# Patient Record
Sex: Male | Born: 1960 | Race: White | Hispanic: No | Marital: Married | Smoking: Current some day smoker
Health system: Southern US, Community
[De-identification: ages and names within clinical notes are randomized; demographics above are authoritative.]

## PROBLEM LIST (undated history)

## (undated) DIAGNOSIS — D582 Other hemoglobinopathies: Secondary | ICD-10-CM

## (undated) DIAGNOSIS — E785 Hyperlipidemia, unspecified: Secondary | ICD-10-CM

## (undated) DIAGNOSIS — I10 Essential (primary) hypertension: Secondary | ICD-10-CM

## (undated) DIAGNOSIS — I2510 Atherosclerotic heart disease of native coronary artery without angina pectoris: Secondary | ICD-10-CM

## (undated) HISTORY — PX: CARDIAC CATHETERIZATION: SHX172

---

## 2006-04-07 ENCOUNTER — Emergency Department (HOSPITAL_COMMUNITY): Admission: EM | Admit: 2006-04-07 | Discharge: 2006-04-07 | Payer: Self-pay | Admitting: Emergency Medicine

## 2006-04-11 ENCOUNTER — Emergency Department (HOSPITAL_COMMUNITY): Admission: EM | Admit: 2006-04-11 | Discharge: 2006-04-12 | Payer: Self-pay | Admitting: Emergency Medicine

## 2007-02-09 ENCOUNTER — Emergency Department (HOSPITAL_COMMUNITY): Admission: EM | Admit: 2007-02-09 | Discharge: 2007-02-09 | Payer: Self-pay | Admitting: Emergency Medicine

## 2009-04-11 ENCOUNTER — Emergency Department (HOSPITAL_COMMUNITY): Admission: EM | Admit: 2009-04-11 | Discharge: 2009-04-11 | Payer: Self-pay | Admitting: Emergency Medicine

## 2010-10-11 LAB — RAPID STREP SCREEN (MED CTR MEBANE ONLY): Streptococcus, Group A Screen (Direct): NEGATIVE

## 2010-12-21 ENCOUNTER — Inpatient Hospital Stay (HOSPITAL_COMMUNITY)
Admission: EM | Admit: 2010-12-21 | Discharge: 2010-12-25 | DRG: 303 | Disposition: A | Discharge status: Home / Self Care | Payer: Medicaid Other | Source: Ambulatory Visit | Attending: Internal Medicine | Admitting: Internal Medicine

## 2010-12-21 ENCOUNTER — Emergency Department (HOSPITAL_COMMUNITY): Payer: Medicaid Other

## 2010-12-21 ENCOUNTER — Encounter: Payer: Self-pay | Admitting: Emergency Medicine

## 2010-12-21 DIAGNOSIS — Z79899 Other long term (current) drug therapy: Secondary | ICD-10-CM

## 2010-12-21 DIAGNOSIS — I10 Essential (primary) hypertension: Secondary | ICD-10-CM | POA: Diagnosis present

## 2010-12-21 DIAGNOSIS — I200 Unstable angina: Secondary | ICD-10-CM | POA: Diagnosis present

## 2010-12-21 DIAGNOSIS — E785 Hyperlipidemia, unspecified: Secondary | ICD-10-CM | POA: Diagnosis present

## 2010-12-21 DIAGNOSIS — I249 Acute ischemic heart disease, unspecified: Secondary | ICD-10-CM

## 2010-12-21 DIAGNOSIS — J45909 Unspecified asthma, uncomplicated: Secondary | ICD-10-CM | POA: Diagnosis present

## 2010-12-21 DIAGNOSIS — E119 Type 2 diabetes mellitus without complications: Secondary | ICD-10-CM | POA: Diagnosis present

## 2010-12-21 DIAGNOSIS — I2510 Atherosclerotic heart disease of native coronary artery without angina pectoris: Principal | ICD-10-CM | POA: Diagnosis present

## 2010-12-21 DIAGNOSIS — Z87891 Personal history of nicotine dependence: Secondary | ICD-10-CM

## 2010-12-21 DIAGNOSIS — Z23 Encounter for immunization: Secondary | ICD-10-CM

## 2010-12-21 DIAGNOSIS — Z7982 Long term (current) use of aspirin: Secondary | ICD-10-CM

## 2010-12-21 HISTORY — DX: Essential (primary) hypertension: I10

## 2010-12-21 HISTORY — DX: Hyperlipidemia, unspecified: E78.5

## 2010-12-21 LAB — POCT I-STAT, CHEM 8
BUN: 25 mg/dL — ABNORMAL HIGH (ref 6–23)
Calcium, Ion: 1.25 mmol/L (ref 1.12–1.32)
HCT: 56 % — ABNORMAL HIGH (ref 39.0–52.0)
Sodium: 140 mEq/L (ref 135–145)
TCO2: 28 mmol/L (ref 0–100)

## 2010-12-21 LAB — CBC
Hemoglobin: 18.7 g/dL — ABNORMAL HIGH (ref 13.0–17.0)
MCHC: 35.8 g/dL (ref 30.0–36.0)
Platelets: 178 10*3/uL (ref 150–400)
RDW: 13 % (ref 11.5–15.5)
WBC: 9.9 10*3/uL (ref 4.0–10.5)

## 2010-12-21 LAB — DIFFERENTIAL
Basophils Absolute: 0.1 10*3/uL (ref 0.0–0.1)
Eosinophils Relative: 4 % (ref 0–5)
Lymphocytes Relative: 38 % (ref 12–46)
Lymphs Abs: 3.7 10*3/uL (ref 0.7–4.0)
Monocytes Absolute: 0.7 10*3/uL (ref 0.1–1.0)
Neutro Abs: 4.9 10*3/uL (ref 1.7–7.7)
Neutrophils Relative %: 50 % (ref 43–77)

## 2010-12-21 LAB — POCT I-STAT TROPONIN I

## 2010-12-21 NOTE — ED Notes (Signed)
Pt reports left sided chest pain radiates to left upper back x 1 week.

## 2010-12-21 NOTE — ED Notes (Signed)
Patient was in [place] last month and did not feel well (i.e. Chest pain); went to see a MD.  Patient was told that he had an 80% blockage in one of his cardiac arteries.  Patient states that he did not believe what the doctor told him, but last week, patient began to feel the same chest pain as before.  Patient describes location of the chest pain as "left sided"; pain radiates to his upper back.  Patient rates pain 8/10 on the numerical pain scale; unable to describe pain.  Patient states that the pain has been going on for a month and a half; worsening pain today (patient states that he was not doing anything when the pain suddenly became worse).  Patient denies shortness of breath, dizziness, nausea/vomiting, and numbness/tingling in hands/feet.  Upon arrival to room, patient connected to continuous cardiac, pulse ox, and blood pressure monitor.  Patient changed into gown; patient alert and oriented x4; PERRL present.  Will continue to monitor.

## 2010-12-22 ENCOUNTER — Encounter (HOSPITAL_COMMUNITY): Payer: Self-pay | Admitting: Cardiovascular Disease

## 2010-12-22 DIAGNOSIS — R079 Chest pain, unspecified: Secondary | ICD-10-CM

## 2010-12-22 DIAGNOSIS — I249 Acute ischemic heart disease, unspecified: Secondary | ICD-10-CM

## 2010-12-22 LAB — CREATININE, SERUM
Creatinine, Ser: 0.86 mg/dL (ref 0.50–1.35)
GFR calc Af Amer: >90 mL/min (ref >90)
GFR calc non Af Amer: >90 mL/min (ref >90)

## 2010-12-22 LAB — CARDIAC PANEL(CRET KIN+CKTOT+MB+TROPI)
CK, MB: 2.9 ng/mL (ref 0.3–4.0)
CK, MB: 2.8 ng/mL (ref 0.3–4.0)
Total CK: 88 U/L (ref 7–232)
Total CK: 82 U/L (ref 7–232)
Troponin I: <0.3 ng/mL (ref <0.30)

## 2010-12-22 LAB — CBC
HCT: 49.2 % (ref 39.0–52.0)
MCHC: 35.4 g/dL (ref 30.0–36.0)
Platelets: 161 10*3/uL (ref 150–400)
RDW: 13.2 % (ref 11.5–15.5)
WBC: 7 10*3/uL (ref 4.0–10.5)

## 2010-12-22 MED ORDER — ASPIRIN EC 325 MG PO TBEC: 325.0000 mg | DELAYED_RELEASE_TABLET | Freq: Every day | ORAL | Status: DC

## 2010-12-22 MED ORDER — GLIPIZIDE 5 MG PO TABS
5.0000 mg | ORAL_TABLET | Freq: Every day | ORAL | Status: DC
  Administered 2010-12-23 – 2010-12-25 (×3): 5 mg via ORAL
  Filled 2010-12-22 (×4): qty 1

## 2010-12-22 MED ORDER — SODIUM CHLORIDE 0.9 % IV SOLN
250.0000 mL | INTRAVENOUS | Status: DC | PRN
  Administered 2010-12-22: 1000 mL via INTRAVENOUS

## 2010-12-22 MED ORDER — ROCURONIUM BROMIDE 50 MG/5ML IV SOLN
INTRAVENOUS | Status: AC
  Filled 2010-12-22: qty 2

## 2010-12-22 MED ORDER — NITROGLYCERIN 0.4 MG SL SUBL: 0.4000 mg | SUBLINGUAL_TABLET | SUBLINGUAL | Status: DC | PRN

## 2010-12-22 MED ORDER — SIMVASTATIN 40 MG PO TABS
40.0000 mg | ORAL_TABLET | Freq: Every day | ORAL | Status: DC
  Administered 2010-12-22: 40 mg via ORAL
  Filled 2010-12-22 (×2): qty 1

## 2010-12-22 MED ORDER — ACETAMINOPHEN 325 MG PO TABS: 650.0000 mg | ORAL_TABLET | ORAL | Status: DC | PRN

## 2010-12-22 MED ORDER — ASPIRIN EC 81 MG PO TBEC
81.0000 mg | DELAYED_RELEASE_TABLET | Freq: Every day | ORAL | Status: DC
  Administered 2010-12-22 – 2010-12-25 (×4): 81 mg via ORAL
  Filled 2010-12-22 (×4): qty 1

## 2010-12-22 MED ORDER — ETOMIDATE 2 MG/ML IV SOLN
INTRAVENOUS | Status: AC
  Filled 2010-12-22: qty 20

## 2010-12-22 MED ORDER — HEPARIN (PORCINE) IN NACL 100-0.45 UNIT/ML-% IJ SOLN
1000.0000 [IU]/h | INTRAMUSCULAR | Status: DC
  Administered 2010-12-22: 1000 [IU]/h via INTRAVENOUS
  Filled 2010-12-22: qty 250

## 2010-12-22 MED ORDER — TICAGRELOR 90 MG PO TABS
90.0000 mg | ORAL_TABLET | Freq: Two times a day (BID) | ORAL | Status: DC
  Administered 2010-12-22 – 2010-12-23 (×3): 90 mg via ORAL
  Filled 2010-12-22 (×4): qty 1

## 2010-12-22 MED ORDER — SODIUM CHLORIDE 0.9 % IJ SOLN
3.0000 mL | INTRAMUSCULAR | Status: DC | PRN
  Administered 2010-12-22: 3 mL via INTRAVENOUS

## 2010-12-22 MED ORDER — ONDANSETRON HCL 4 MG/2ML IJ SOLN: 4.0000 mg | Freq: Four times a day (QID) | INTRAMUSCULAR | Status: DC | PRN

## 2010-12-22 MED ORDER — ACETAMINOPHEN 80 MG PO CHEW: 324.0000 mg | CHEWABLE_TABLET | Freq: Once | ORAL | Status: DC

## 2010-12-22 MED ORDER — SODIUM CHLORIDE 0.9 % IJ SOLN
3.0000 mL | Freq: Two times a day (BID) | INTRAMUSCULAR | Status: DC
  Administered 2010-12-22 – 2010-12-25 (×5): 3 mL via INTRAVENOUS

## 2010-12-22 MED ORDER — ENALAPRIL MALEATE 5 MG PO TABS
5.0000 mg | ORAL_TABLET | Freq: Two times a day (BID) | ORAL | Status: DC
  Administered 2010-12-22 – 2010-12-25 (×7): 5 mg via ORAL
  Filled 2010-12-22 (×8): qty 1

## 2010-12-22 MED ORDER — METOPROLOL TARTRATE 25 MG PO TABS
25.0000 mg | ORAL_TABLET | Freq: Two times a day (BID) | ORAL | Status: DC
  Administered 2010-12-22 – 2010-12-25 (×7): 25 mg via ORAL
  Filled 2010-12-22 (×8): qty 1

## 2010-12-22 MED ORDER — NITROGLYCERIN 0.4 MG SL SUBL
0.4000 mg | SUBLINGUAL_TABLET | SUBLINGUAL | Status: AC | PRN
  Administered 2010-12-22 (×3): 0.4 mg via SUBLINGUAL
  Filled 2010-12-22: qty 75

## 2010-12-22 MED ORDER — MORPHINE SULFATE 2 MG/ML IJ SOLN
2.0000 mg | Freq: Once | INTRAMUSCULAR | Status: AC
  Administered 2010-12-22: 2 mg via INTRAVENOUS
  Filled 2010-12-22: qty 1

## 2010-12-22 MED ORDER — LIDOCAINE HCL (CARDIAC) 20 MG/ML IV SOLN
INTRAVENOUS | Status: AC
  Filled 2010-12-22: qty 5

## 2010-12-22 MED ORDER — SUCCINYLCHOLINE CHLORIDE 20 MG/ML IJ SOLN
INTRAMUSCULAR | Status: AC
  Filled 2010-12-22: qty 5

## 2010-12-22 MED ORDER — MORPHINE SULFATE 4 MG/ML IJ SOLN
4.0000 mg | Freq: Once | INTRAMUSCULAR | Status: AC
  Administered 2010-12-22: 4 mg via INTRAVENOUS
  Filled 2010-12-22: qty 1

## 2010-12-22 MED ORDER — ONDANSETRON HCL 4 MG/2ML IJ SOLN
4.0000 mg | Freq: Once | INTRAMUSCULAR | Status: AC
  Administered 2010-12-22: 4 mg via INTRAVENOUS
  Filled 2010-12-22: qty 2

## 2010-12-22 MED ORDER — ENOXAPARIN SODIUM 40 MG/0.4ML ~~LOC~~ SOLN
40.0000 mg | SUBCUTANEOUS | Status: DC
  Administered 2010-12-23: 40 mg via SUBCUTANEOUS
  Filled 2010-12-22 (×3): qty 0.4

## 2010-12-22 MED ORDER — ASPIRIN 81 MG PO CHEW
324.0000 mg | CHEWABLE_TABLET | Freq: Once | ORAL | Status: AC
  Administered 2010-12-22: 324 mg via ORAL
  Filled 2010-12-22: qty 4

## 2010-12-22 MED ORDER — ASPIRIN 325 MG PO TABS: 325.0000 mg | ORAL_TABLET | Freq: Once | ORAL | Status: DC

## 2010-12-22 NOTE — ED Provider Notes (Signed)
History     CSN: [id number] Arrival date & time: 12/21/2010  7:19 PM   First MD Initiated Contact with Patient 12/21/10 2358      Chief Complaint  Patient presents with  . Chest Pain     left sided chest pain radiates to left back    (Consider location/radiation/quality/duration/timing/severity/associated sxs/prior treatment) Patient is a 50 y.o. male presenting with chest pain. The history is provided by the patient.  Chest Pain Episode onset: Over a month ago ,ongoing for the last week. Duration of episode(s) is 7 days. Chest pain occurs frequently. The chest pain is unchanged. The pain is associated with exertion. At its most intense, the pain is at 8/10. The pain is currently at 5/10. The severity of the pain is moderate. The quality of the pain is described as pressure-like. The pain radiates to the upper back. Chest pain is worsened by exertion. Primary symptoms include shortness of breath and nausea. Pertinent negatives for primary symptoms include no fever, no fatigue, no wheezing, no abdominal pain, no vomiting and no altered mental status.  Pertinent negatives for associated symptoms include no diaphoresis and no near-syncope. Risk factors include male gender.  His past medical history is significant for CAD.  Pertinent negatives for past medical history include no MI.    left-sided chest pain radiates to back. Patient is from [place] and now lives here locally. He has a history of diabetes and hypertension. While in [place] about a month ago he had a hospital admission for chest pain with cardiology evaluation and by paperwork today shows me, had coronary angiogram that shows three-vessel disease. It was recommended that he have CABG. patient was uncomfortable with this and decided to come to the [place] for second opinion. He presents here tonight with one week of on and off chest pain worse with exertion, relieved by rest. Some shortness of breath and some nausea. No diaphoresis.    Past Medical History  Diagnosis Date  . Hypertension   . Diabetes mellitus   . Asthma     History reviewed. No pertinent past surgical history.  History reviewed. No pertinent family history.  History  Substance Use Topics  . Smoking status: Former Games developer  . Smokeless tobacco: Never Used  . Alcohol Use: No      Review of Systems  Constitutional: Negative for fever, chills, diaphoresis and fatigue.  HENT: Negative for neck pain and neck stiffness.   Eyes: Negative for pain.  Respiratory: Positive for shortness of breath. Negative for wheezing.   Cardiovascular: Positive for chest pain. Negative for near-syncope.  Gastrointestinal: Positive for nausea. Negative for vomiting and abdominal pain.  Genitourinary: Negative for dysuria.  Musculoskeletal: Negative for back pain.  Skin: Negative for rash.  Neurological: Negative for headaches.  Psychiatric/Behavioral: Negative for altered mental status.  All other systems reviewed and are negative.    Allergies  Review of patient's allergies indicates no known allergies.  Home Medications  No current outpatient prescriptions on file.  BP 142/86  Pulse 78  Temp(Src) 97.8 F (36.6 C) (Oral)  Resp 18  SpO2 94%  Physical Exam  Constitutional: He is oriented to person, place, and time. He appears well-developed and well-nourished.  HENT:  Head: Normocephalic and atraumatic.  Eyes: Conjunctivae and EOM are normal. Pupils are equal, round, and reactive to light.  Neck: Trachea normal. Neck supple. No thyromegaly present.  Cardiovascular: Normal rate, regular rhythm, S1 normal, S2 normal and normal pulses.     No systolic  murmur is present   No diastolic murmur is present  Pulses:      Radial pulses are 2+ on the right side, and 2+ on the left side.  Pulmonary/Chest: Effort normal and breath sounds normal. He has no wheezes. He has no rhonchi. He has no rales. He exhibits no tenderness.  Abdominal: Soft. Normal  appearance and bowel sounds are normal. There is no tenderness. There is no CVA tenderness and negative Murphy's sign.  Musculoskeletal:       BLE:s Calves nontender, no cords or erythema, negative Homans sign  Neurological: He is alert and oriented to person, place, and time. He has normal strength. No cranial nerve deficit or sensory deficit. GCS eye subscore is 4. GCS verbal subscore is 5. GCS motor subscore is 6.  Skin: Skin is warm and dry. No rash noted. He is not diaphoretic.  Psychiatric: His speech is normal.       Cooperative and appropriate    ED Course  Procedures (including critical care time)  Labs Reviewed  CBC - Abnormal; Notable for the following:    RBC 5.98 (*)    Hemoglobin 18.7 (*)    HCT 52.2 (*)    All other components within normal limits  POCT I-STAT, CHEM 8 - Abnormal; Notable for the following:    BUN 25 (*)    Glucose, Bld 126 (*)    Hemoglobin 19.0 (*)    HCT 56.0 (*)    All other components within normal limits  DIFFERENTIAL  D-DIMER, QUANTITATIVE  POCT I-STAT TROPONIN I  I-STAT, CHEM 8  I-STAT TROPONIN I   Dg Chest 2 View  12/21/2010  *RADIOLOGY REPORT*  Clinical Data: Chest pain and back pain.  CHEST - 2 VIEW  Comparison: None.  Findings: Borderline cardiomegaly.   Mediastinal hilar contours within normal limits.  There is diffuse peribronchial thickening. No airspace disease or pleural effusion.  No acute bony abnormality is seen.  Degenerative changes of the thoracic spine.  IMPRESSION: Peribronchial thickening.  This can be related to smoking, acute or chronic bronchitis, or asthma.  Original Report Authenticated By: Britta Mccreedy, M.D.   Aspirin given. Heparin initiated for symptoms of unstable angina. Cardiology consult at 12:30 AM, agree to ED evaluation likely admit possible cath.   Date: 12/22/2010  Rate: 79  Rhythm: normal sinus rhythm  QRS Axis: normal  Intervals: normal  ST/T Wave abnormalities: nonspecific ST/T changes  Conduction  Disutrbances:none  Narrative Interpretation:   Old EKG Reviewed: none available  Active chest pain emergency department requiring nitroglycerin and morphine. Serial evaluations. Stat EKG reviewed as above no ST changes does have upright T waves in V1. No old EKG available. IV heparin drip initiated.   CRITICAL CARE Performed by: Sunnie Nielsen   Total critical care time: 40  Critical care time was exclusive of separately billable procedures and treating other patients.  Critical care was necessary to treat or prevent imminent or life-threatening deterioration.  Critical care was time spent personally by me on the following activities: development of treatment plan with patient and/or surrogate as well as nursing, discussions with consultants, evaluation of patient's response to treatment, examination of patient, obtaining history from patient or surrogate, ordering and performing treatments and interventions, ordering and review of laboratory studies, ordering and review of radiographic studies, pulse oximetry and re-evaluation of patient's condition.   MDM: chest pain likely unstable angina. Stat EKG and serial evaluations. Cardiac monitor. IV. Oxygen via nasal cannula. Stat chest x-ray and blood work.  Cardiology consultation for admission for further testing and evaluation.         Sunnie Nielsen, MD 12/22/10 [id number]

## 2010-12-22 NOTE — ED Notes (Signed)
Patient currently resting quietly in bed; no respiratory or acute distress noted.  Patient updated on plan of care; informed patient that a bed is available and report will be called.  Patient has no other questions or concerns at time.  Will continue to monitor.

## 2010-12-22 NOTE — H&P (Signed)
Keith Flores is an 50 y.o. male from [place] with a history of CAD,HTN,DM and hyperlipidemia. Chief Complaint: Chest pain HPI: Patient has been having on and off dull retrosternal chest pain which is not associated with any radiation, diaphoresis, nausea , vomiting or shortness of breath. His pain was vaguely described and it appears that he has not really had much pain in the last few days. He really came in because had a recent cath in [place] (oct 2012) which revealed 70-90% proximal LAD stenosis , 90-99% OM 1 stenosis, and 50-70% mid RCA stenosis. He was advised to have CABG but was skeptical of the healthcare in [place] so he flew in last week to have a second opinion and possible surgery or stent placement in the [place].   Past Medical History  Diagnosis Date  . Hypertension   . Diabetes mellitus   . Asthma   . Hyperlipidemia     Past Surgical History  Procedure Date  . Cardiac catheterization     History reviewed. No pertinent family history. Social History:  reports that he has quit smoking. He has never used smokeless tobacco. He reports that he does not drink alcohol or use illicit drugs.  Allergies: No Known Allergies  Medications Prior to Admission  Medication Dose Route Frequency Provider Last Rate Last Dose  . 0.9 %  sodium chloride infusion  250 mL Intravenous PRN Sunnie Nielsen, MD 10 mL/hr at 12/22/10 0505 1,000 mL at 12/22/10 0505  . acetaminophen (TYLENOL) tablet 650 mg  650 mg Oral Q4H PRN Sunnie Nielsen, MD      . aspirin chewable tablet 324 mg  324 mg Oral Once Sunnie Nielsen, MD   324 mg at 12/22/10 0051  . aspirin EC tablet 81 mg  81 mg Oral Daily Kimberly Ballard Hammons, PHARMD      . enalapril (VASOTEC) tablet 5 mg  5 mg Oral BID Sunnie Nielsen, MD      . enoxaparin (LOVENOX) injection 40 mg  40 mg Subcutaneous Q24H Sunnie Nielsen, MD      . metoprolol tartrate (LOPRESSOR) tablet 25 mg  25 mg Oral BID Sunnie Nielsen, MD      . morphine 2 MG/ML injection 2 mg  2 mg Intravenous Once  Sunnie Nielsen, MD   2 mg at 12/22/10 0357  . morphine 4 MG/ML injection 4 mg  4 mg Intravenous Once Sunnie Nielsen, MD   4 mg at 12/22/10 0043  . nitroGLYCERIN (NITROSTAT) SL tablet 0.4 mg  0.4 mg Sublingual Q5 min PRN Sunnie Nielsen, MD   0.4 mg at 12/22/10 0121  . nitroGLYCERIN (NITROSTAT) SL tablet 0.4 mg  0.4 mg Sublingual Q5 min PRN Sunnie Nielsen, MD      . ondansetron [facility]) injection 4 mg  4 mg Intravenous Once Sunnie Nielsen, MD   4 mg at 12/22/10 0042  . ondansetron (ZOFRAN) injection 4 mg  4 mg Intravenous Q6H PRN Sunnie Nielsen, MD      . simvastatin (ZOCOR) tablet 40 mg  40 mg Oral q1800 Sunnie Nielsen, MD      . sodium chloride 0.9 % injection 3 mL  3 mL Intravenous Q12H Sunnie Nielsen, MD      . sodium chloride 0.9 % injection 3 mL  3 mL Intravenous PRN Sunnie Nielsen, MD      . Ticagrelor [facility]) tablet 90 mg  90 mg Oral BID Sunnie Nielsen, MD      . DISCONTD: acetaminophen (TYLENOL) chewable tablet 320 mg  320 mg  Oral Once Hurman Horn, MD      . DISCONTD: aspirin EC tablet 325 mg  325 mg Oral Daily Sunnie Nielsen, MD      . DISCONTD: aspirin tablet 325 mg  325 mg Oral Once Sunnie Nielsen, MD      . DISCONTD: etomidate (AMIDATE) 2 MG/ML injection           . DISCONTD: heparin ADULT infusion 100 units/mL (25000 units/250 mL)  1,000 Units/hr Intravenous Continuous Sunnie Nielsen, MD 10 mL/hr at 12/22/10 0358 1,000 Units/hr at 12/22/10 0358  . DISCONTD: lidocaine (cardiac) 100 mg/58ml (XYLOCAINE) 20 MG/ML injection 2%           . DISCONTD: rocuronium (ZEMURON) 50 MG/5ML injection           . DISCONTD: succinylcholine (ANECTINE) 20 MG/ML injection            Medications Prior to Admission  Medication Sig Dispense Refill  . aspirin 81 MG tablet Take 81 mg by mouth daily.          Results for orders placed during the hospital encounter of 12/21/10 (from the past 48 hour(s))  CBC     Status: Abnormal   Collection Time   12/21/10 10:01 PM      Component Value Range Comment   WBC 9.9  4.0 - 10.5 (K/uL)    RBC  5.98 (*) 4.22 - 5.81 (MIL/uL)    Hemoglobin 18.7 (*) 13.0 - 17.0 (g/dL)    HCT 40.9 (*) 81.1 - 52.0 (%)    MCV 87.3  78.0 - 100.0 (fL)    MCH 31.3  26.0 - 34.0 (pg)    MCHC 35.8  30.0 - 36.0 (g/dL)    RDW 91.4  78.2 - 95.6 (%)    Platelets 178  150 - 400 (K/uL)   DIFFERENTIAL     Status: Normal   Collection Time   12/21/10 10:01 PM      Component Value Range Comment   Neutrophils Relative 50  43 - 77 (%)    Neutro Abs 4.9  1.7 - 7.7 (K/uL)    Lymphocytes Relative 38  12 - 46 (%)    Lymphs Abs 3.7  0.7 - 4.0 (K/uL)    Monocytes Relative 7  3 - 12 (%)    Monocytes Absolute 0.7  0.1 - 1.0 (K/uL)    Eosinophils Relative 4  0 - 5 (%)    Eosinophils Absolute 0.4  0.0 - 0.7 (K/uL)    Basophils Relative 1  0 - 1 (%)    Basophils Absolute 0.1  0.0 - 0.1 (K/uL)   D-DIMER, QUANTITATIVE     Status: Normal   Collection Time   12/21/10 10:01 PM      Component Value Range Comment   D-Dimer, Quant 0.37  0.00 - 0.48 (ug/mL-FEU)   POCT I-STAT TROPONIN I     Status: Normal   Collection Time   12/21/10 10:19 PM      Component Value Range Comment   Troponin i, poc 0.00  0.00 - 0.08 (ng/mL)    Comment 3            POCT I-STAT, CHEM 8     Status: Abnormal   Collection Time   12/21/10 10:22 PM      Component Value Range Comment   Sodium 140  135 - 145 (mEq/L)    Potassium 4.0  3.5 - 5.1 (mEq/L)    Chloride 101  96 - 112 (  mEq/L)    BUN 25 (*) 6 - 23 (mg/dL)    Creatinine, Ser 8.29  0.50 - 1.35 (mg/dL)    Glucose, Bld 562 (*) 70 - 99 (mg/dL)    Calcium, Ion 1.30  1.12 - 1.32 (mmol/L)    TCO2 28  0 - 100 (mmol/L)    Hemoglobin 19.0 (*) 13.0 - 17.0 (g/dL)    HCT 86.5 (*) 78.4 - 52.0 (%)    Dg Chest 2 View  12/21/2010  *RADIOLOGY REPORT*  Clinical Data: Chest pain and back pain.  CHEST - 2 VIEW  Comparison: None.  Findings: Borderline cardiomegaly.   Mediastinal hilar contours within normal limits.  There is diffuse peribronchial thickening. No airspace disease or pleural effusion.  No acute bony  abnormality is seen.  Degenerative changes of the thoracic spine.  IMPRESSION: Peribronchial thickening.  This can be related to smoking, acute or chronic bronchitis, or asthma.  Original Report Authenticated By: Britta Mccreedy, M.D.    Review of Systems  Constitutional: Negative.   HENT: Negative.   Eyes: Negative.   Respiratory: Negative.   Gastrointestinal: Negative.   Genitourinary: Negative.   Musculoskeletal: Negative.   Skin: Negative.   Neurological: Negative.   Endo/Heme/Allergies: Negative.   Psychiatric/Behavioral: Negative.     Blood pressure 154/91, pulse 67, temperature 96.7 F (35.9 C), temperature source Oral, resp. rate 18, height 5' 6.54" (1.69 m), weight 218 lb 11.1 oz (99.2 kg), SpO2 96.00%. Physical Exam  Constitutional: He is oriented to person, place, and time. No distress.  HENT:  Head: Normocephalic.  Mouth/Throat: No oropharyngeal exudate.  Eyes: Pupils are equal, round, and reactive to light. Right eye exhibits no discharge.  Neck: Normal range of motion. No JVD present.  Cardiovascular: Normal rate, regular rhythm, normal heart sounds and intact distal pulses.  Exam reveals no gallop and no friction rub.   No murmur heard. Respiratory: Effort normal and breath sounds normal.  GI: Soft. Bowel sounds are normal. There is no tenderness.  Musculoskeletal: Normal range of motion.  Lymphadenopathy:    He has no cervical adenopathy.  Neurological: He is alert and oriented to person, place, and time.  Skin: Skin is warm and dry. He is not diaphoretic.  Psychiatric: He has a normal mood and affect.    EKG  Sinus, normal axis, no significant Q waves, non specific st t abnormalities  Assessment/Plan 1) ACS - Unstable angina 2) DM 3)Hyperlipidemia  50 yr old man with HTN, DM and hyperlipidemia who presents with chest pain and a recent cath revealing triple vessel disease involving the proximal LAD and a critical OM1 lesion. He has a CD of his cath with him  for review.  Plan 1) ACS Admit to telemetry under Dr Jones Broom Serial ekgs and cardiac enzymes ASA 325mg  daily Ticagrelor 90 mg twice daily Enalapril 5mg  twice daily Zocor 40 mg daily Metoprolol 25 mg twice daily NTG Review cath film to determine if extent of stenosis is as stated. If so, will refer for CABG.  2) DM Start glipizide 5mg  daily  3) Hyperlipidemia Continue zocor Check lipid panel    Keith Flores 12/22/2010, 7:43 AM

## 2010-12-22 NOTE — ED Notes (Signed)
Sent message to pharmacy about delay with Heparin dosage.

## 2010-12-22 NOTE — ED Notes (Signed)
Patient currently sitting up in bed; no respiratory or acute distress noted.  Updated patient on plan of care; informed patient that we are waiting on pharmacy to dose heparin and we are waiting for the cardiologist to come and visit patient.  Patient has no other questions or concerns at this time.  Will continue to monitor.

## 2010-12-22 NOTE — ED Notes (Signed)
Patient currently resting quietly in bed; no respiratory or acute distress noted.  Updated patient on plan of care; informed patient we are currently waiting on pharmacy to dose heparin.  Patient has no other questions or concerns at this time.  Will continue to monitor.

## 2010-12-22 NOTE — ED Notes (Signed)
Patient currently resting quietly in bed; no respiratory or acute distress noted.  Will continue to monitor. 

## 2010-12-22 NOTE — ED Notes (Signed)
Patient currently sitting up in bed; no respiratory or acute distress noted.  Family present at bedside. Updated patient and family on plan of care; informed patient that we are waiting on the EDP to come and see patient.  Patient has no other questions or concerns at this time; will continue to monitor.

## 2010-12-22 NOTE — ED Notes (Signed)
Dr. Opitz at bedside. 

## 2010-12-22 NOTE — ED Notes (Signed)
Calling report now. 

## 2010-12-22 NOTE — ED Notes (Signed)
Patient being transported upstairs on portable cardiac monitor with tele nurse tech. 

## 2010-12-23 ENCOUNTER — Other Ambulatory Visit: Payer: Self-pay

## 2010-12-23 ENCOUNTER — Encounter (HOSPITAL_COMMUNITY): Payer: Self-pay | Admitting: General Practice

## 2010-12-23 DIAGNOSIS — I2510 Atherosclerotic heart disease of native coronary artery without angina pectoris: Secondary | ICD-10-CM

## 2010-12-23 DIAGNOSIS — Z01810 Encounter for preprocedural cardiovascular examination: Secondary | ICD-10-CM

## 2010-12-23 DIAGNOSIS — I200 Unstable angina: Secondary | ICD-10-CM

## 2010-12-23 LAB — CBC
MCH: 31.7 pg (ref 26.0–34.0)
MCH: 31 pg (ref 26.0–34.0)
MCHC: 36.6 g/dL — ABNORMAL HIGH (ref 30.0–36.0)
MCHC: 36.3 g/dL — ABNORMAL HIGH (ref 30.0–36.0)
Platelets: 168 10*3/uL (ref 150–400)
Platelets: 175 10*3/uL (ref 150–400)
RBC: 6 MIL/uL — ABNORMAL HIGH (ref 4.22–5.81)
RDW: 13 % (ref 11.5–15.5)

## 2010-12-23 LAB — PROTIME-INR
INR: 1.04 (ref 0.00–1.49)
Prothrombin Time: 13.8 seconds (ref 11.6–15.2)

## 2010-12-23 LAB — BASIC METABOLIC PANEL
Calcium: 9.1 mg/dL (ref 8.4–10.5)
GFR calc Af Amer: >90 mL/min (ref >90)
GFR calc non Af Amer: >90 mL/min (ref >90)
Potassium: 3.5 mEq/L (ref 3.5–5.1)
Sodium: 136 mEq/L (ref 135–145)

## 2010-12-23 LAB — LIPID PANEL
Cholesterol: 168 mg/dL (ref 0–200)
LDL Cholesterol: 88 mg/dL (ref 0–99)
Total CHOL/HDL Ratio: 5.8 RATIO
Triglycerides: 257 mg/dL — ABNORMAL HIGH (ref <150)
VLDL: 51 mg/dL — ABNORMAL HIGH (ref 0–40)

## 2010-12-23 LAB — HEPARIN LEVEL (UNFRACTIONATED): Heparin Unfractionated: 0.44 IU/mL (ref 0.30–0.70)

## 2010-12-23 MED ORDER — HEPARIN BOLUS VIA INFUSION
4000.0000 [IU] | Freq: Once | INTRAVENOUS | Status: AC
  Administered 2010-12-23: 4000 [IU] via INTRAVENOUS
  Filled 2010-12-23: qty 4000

## 2010-12-23 MED ORDER — PNEUMOCOCCAL VAC POLYVALENT 25 MCG/0.5ML IJ INJ
0.5000 mL | INJECTION | INTRAMUSCULAR | Status: AC
  Administered 2010-12-24: 0.5 mL via INTRAMUSCULAR
  Filled 2010-12-23: qty 0.5

## 2010-12-23 MED ORDER — ROSUVASTATIN CALCIUM 40 MG PO TABS
40.0000 mg | ORAL_TABLET | Freq: Every day | ORAL | Status: DC
  Administered 2010-12-23 – 2010-12-24 (×2): 40 mg via ORAL
  Filled 2010-12-23 (×3): qty 1

## 2010-12-23 MED ORDER — HEPARIN SOD (PORCINE) IN D5W 100 UNIT/ML IV SOLN
1200.0000 [IU]/h | INTRAVENOUS | Status: DC
  Administered 2010-12-23 – 2010-12-24 (×2): 1200 [IU]/h via INTRAVENOUS
  Filled 2010-12-23 (×3): qty 250

## 2010-12-23 NOTE — Progress Notes (Signed)
ANTICOAGULATION CONSULT NOTE - Follow Up Consult  Pharmacy Consult for heparin Indication: USAP/CAD  No Known Allergies  Patient Measurements: Height: 5' 6.53" (169 cm) Weight: 215 lb 6.4 oz (97.705 kg) (scale c) IBW/kg (Calculated) : 65.03  Adjusted Body Weight: 86kg  Vital Signs: Temp: 98.4 F (36.9 C) (12/03 1435) Temp src: Oral (12/03 1435) BP: 168/93 mmHg (12/03 1435) Pulse Rate: 79  (12/03 1435)  Labs:  Basename 12/23/10 1923 12/23/10 0149 12/22/10 1130 12/22/10 0841 12/22/10 0838 12/21/10 2222  HGB 18.6* 18.1* -- -- -- --  HCT 51.3 49.4 -- -- 49.2 --  PLT 175 168 -- -- 161 --  APTT -- -- -- -- -- --  LABPROT -- 13.8 -- -- -- --  INR -- 1.04 -- -- -- --  HEPARINUNFRC 0.44 -- -- -- -- --  CREATININE -- 0.83 -- -- 0.86 1.10  CKTOTAL -- -- 82 88 -- --  CKMB -- -- 2.8 2.9 -- --  TROPONINI -- -- <0.30 <0.30 -- --   Estimated Creatinine Clearance: 116.3 ml/min (by C-G formula based on Cr of 0.83).   Medications:  Scheduled:    . aspirin EC  81 mg Oral Daily  . enalapril  5 mg Oral BID  . glipiZIDE  5 mg Oral QAC breakfast  . heparin  4,000 Units Intravenous Once  . metoprolol tartrate  25 mg Oral BID  . pneumococcal 23 valent vaccine  0.5 mL Intramuscular Tomorrow-1000  . rosuvastatin  40 mg Oral QHS  . sodium chloride  3 mL Intravenous Q12H  . DISCONTD: enoxaparin  40 mg Subcutaneous Q24H  . DISCONTD: simvastatin  40 mg Oral q1800  . DISCONTD: Ticagrelor  90 mg Oral BID   Infusions:    . heparin 1,200 Units/hr (12/23/10 1532)    Assessment: 50 yo male with USAP and CAD is currently on therapeutic heparin Goal of Therapy:  Heparin level 0.3-0.7 units/ml   Plan:  1) Cont heparin at 1200 units/hr (=26ml/hr) 2) Heparin level and CBC in am  Khian Remo, Tsz-Yin 12/23/2010,8:18 PM

## 2010-12-23 NOTE — Consult Note (Signed)
[address]            [place] [address]          [phone]       [facility] Medical Record #[id number] Date of Birth: [birth year]  Referring: Dr Excell Seltzer Primary Care: none  Chief Complaint:    Chief Complaint  Patient presents with  . Chest Pain     left sided chest pain radiates to left back    History of Present Illness:     Patient has been having on and off dull retrosternal chest pain which is not associated with any radiation, diaphoresis, nausea , vomiting or shortness of breath. His pain was vaguely described and it appears that he has not really had much pain in the last few days. He really came in because had a recent cath in [place] (oct 2012) which revealed 70-90% proximal LAD stenosis , 90-99% OM 1 stenosis, and 50-70% mid RCA stenosis. He was advised to have CABG but was skeptical of the healthcare in [place] so he flew in last week to have a second opinion and possible surgery or stent placement in the [place].    patient does have a history of diabetes that was diagnosed 3 years ago. He has no history of previous myocardial infarction. He said 2 episodes of substernal chest pain one precipitated his evaluation in [place] and 1 that are  precipitated  this admission.  Current Activity/ Functional Status: Patient is independent with mobility/ambulation, transfers, ADL's, IADL's.  Past Medical History  Diagnosis Date  . Hypertension   . Diabetes mellitus  3 years  . Asthma   . Hyperlipidemia     Past Surgical History  Procedure Date  . Cardiac catheterization in [place] OCT 2012     History  Smoking status  . Former Smoker  . Quit date: 10/21/2010  Smokeless tobacco  . Never Used    History  Alcohol Use No      No Known Allergies  Current Facility-Administered Medications  Medication Dose Route Frequency Provider Last Rate Last Dose  . 0.9 %  sodium chloride infusion  250 mL Intravenous PRN Sunnie Nielsen, MD 10 mL/hr at 12/22/10 0505 1,000 mL at 12/22/10 0505  . acetaminophen (TYLENOL) tablet 650 mg  650 mg Oral Q4H PRN Sunnie Nielsen, MD      . aspirin EC tablet 81 mg  81 mg Oral Daily Kimberly Ballard Hammons, PHARMD   81 mg at 12/23/10 1103  . enalapril (VASOTEC) tablet 5 mg  5 mg Oral BID Sunnie Nielsen, MD   5 mg at 12/23/10 1102  . glipiZIDE (GLUCOTROL) tablet 5 mg  5 mg Oral QAC breakfast Dolores Patty, MD   5 mg at 12/23/10 1102  . heparin 100 units/mL bolus via infusion 4,000 Units  4,000 Units Intravenous Once Laurena Bering, PHARMD   4,000 Units at 12/23/10 1532  . heparin ADULT infusion 100 units/ml (25000 units/250 ml)  1,200 Units/hr Intravenous Continuous Laurena Bering, PHARMD 12 mL/hr at 12/23/10 1532 1,200 Units/hr at 12/23/10 1532  . metoprolol tartrate (LOPRESSOR) tablet 25 mg  25 mg Oral BID Sunnie Nielsen, MD   25 mg at 12/23/10 1102  . nitroGLYCERIN (NITROSTAT) SL tablet 0.4 mg  0.4 mg Sublingual Q5 min PRN Sunnie Nielsen, MD      . ondansetron [facility]) injection  4 mg  4 mg Intravenous Q6H PRN Sunnie Nielsen, MD      . pneumococcal 23 valent vaccine (PNU-IMMUNE) injection 0.5 mL  0.5 mL Intramuscular Tomorrow-1000 Dolores Patty, MD      . rosuvastatin (CRESTOR) tablet 40 mg  40 mg Oral QHS Micheline Chapman, MD      . sodium chloride 0.9 % injection 3 mL  3 mL Intravenous Q12H Sunnie Nielsen, MD   3 mL at 12/23/10 1101  . sodium chloride 0.9 % injection 3 mL  3 mL Intravenous PRN Sunnie Nielsen, MD   3 mL at 12/22/10 2159  . DISCONTD: enoxaparin (LOVENOX) injection 40 mg  40 mg Subcutaneous Q24H Sunnie Nielsen, MD   40 mg at 12/23/10 1102  . DISCONTD: simvastatin (ZOCOR) tablet 40 mg  40 mg Oral q1800 Sunnie Nielsen, MD   40 mg at 12/22/10 1714  . DISCONTD: Ticagrelor (BRILINTA) tablet 90 mg  90 mg Oral BID Sunnie Nielsen, MD   90 mg at 12/23/10 1102    Prescriptions prior to admission  Medication Sig Dispense Refill  . aspirin 81 MG tablet Take 81 mg by mouth daily.           History reviewed. 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  HENT: Negative.   Eyes: Negative for blurred vision, double vision, photophobia, pain, discharge and redness.  Respiratory: Positive for wheezing. Negative for sputum production and shortness of breath.   Cardiovascular: Positive for chest pain. Negative for palpitations, orthopnea, claudication, leg swelling and PND.  Gastrointestinal: Negative.   Genitourinary: Negative.   Musculoskeletal: Negative.   Skin: Negative.   Neurological: Negative.  Negative for weakness.  Endo/Heme/Allergies: Negative.   Psychiatric/Behavioral: Negative.   All other systems reviewed and are negative.     Physical Exam: BP 168/93  Pulse 79  Temp(Src) 98.4 F (36.9 C) (Oral)  Resp 18  Ht 5' 6.54" (1.69 m)  Wt 215 lb 6.4 oz (97.705 kg)  BMI 34.21 kg/m2  SpO2 95%  General appearance: alert, cooperative, appears stated age and no distress Neurologic: intact Heart: regular rate and rhythm, S1, S2 normal, no murmur, click, rub or gallop Lungs: clear to auscultation bilaterally Abdomen: soft, non-tender; bowel sounds normal; no masses,  no organomegaly Extremities: extremities normal, atraumatic, no cyanosis or edema, Homans sign is negative, no sign of DVT and no edema, redness or tenderness in the calves or thighs Intact pedal pulses 1+ dp and pt bilateral  Diagnostic Studies & Laboratory data:     Recent Radiology Findings:   Dg Chest 2 View  12/21/2010  *RADIOLOGY REPORT*  Clinical Data: Chest pain and back pain.  CHEST - 2 VIEW  Comparison: None.  Findings: Borderline cardiomegaly.   Mediastinal hilar  contours within normal limits.  There is diffuse peribronchial thickening. No airspace disease or pleural effusion.  No acute bony abnormality is seen.  Degenerative changes of the thoracic spine.  IMPRESSION: Peribronchial thickening.  This can be related to smoking, acute or chronic  bronchitis, or asthma.  Original Report Authenticated By: Britta Mccreedy, M.D.      Recent Lab Findings: Lab Results  Component Value Date   WBC 8.7 12/23/2010   HGB 18.1* 12/23/2010   HCT 49.4 12/23/2010   PLT 168 12/23/2010   GLUCOSE 103* 12/23/2010   CHOL 168 12/23/2010   TRIG 257* 12/23/2010   HDL 29* 12/23/2010   LDLCALC 88 12/23/2010   NA 136 12/23/2010   K 3.5 12/23/2010   CL 98 12/23/2010   CREATININE 0.83 12/23/2010   BUN 14 12/23/2010   CO2 27 12/23/2010   INR 1.04 12/23/2010    The patient had a cardiac catheterization in [place] in October unfortunately at the time of this consultation the disc is not available, nor are his records that he brought with him. I've discussed with Dr. Excell Seltzer his review of the films and will leave further recommendations after review of the films  Assessment / Plan:      Diabetic By history significant 3 vessel coronary artery disease confirmed by Dr. Earmon Phoenix review of the films  Likely the patient will record coronary artery bypass grafting especially with his known diabetes, I will confirm this after review of the films.  Unfortunately the patient was loaded with Brilinta Consider on her artery bypass grafting later this week or early next week.       Delight Ovens MD 12/23/2010 4:33 PM

## 2010-12-23 NOTE — Progress Notes (Signed)
Subjective:  The patient denies chest pain overnight. He denies dyspnea, orthopnea, or PND. He has had chest discomfort with activity now for several weeks. The patient had a cardiac catheterization in [place] in October and coronary bypass surgery was recommended. He was reluctant to go through this in [place] and now presents with continued exertional chest pain and a second opinion regarding his coronary disease.  Objective:  Vital Signs in the last 24 hours: Temp:  [97.2 F (36.2 C)-98.6 F (37 C)] 98.6 F (37 C) (12/03 0500) Pulse Rate:  [73-74] 73  (12/03 0500) Resp:  [18-20] 20  (12/03 0500) BP: (141-146)/(83-88) 144/88 mmHg (12/03 0500) SpO2:  [92 %-95 %] 92 % (12/03 0500) Weight:  [97.705 kg (215 lb 6.4 oz)] 215 lb 6.4 oz (97.705 kg) (12/03 0500)  Intake/Output from previous day: 12/02 0701 - 12/03 0700 In: 1320 [P.O.:840; I.V.:480] Out: -   Physical Exam: Pt is alert and oriented, overweight male in NAD HEENT: normal Neck: JVP - normal, carotids 2+= without bruits Lungs: CTA bilaterally CV: RRR without murmur or gallop Abd: soft, NT, Positive BS, no hepatomegaly Ext: no C/C/E, distal pulses intact and equal Skin: warm/dry no rash   Lab Results:  Basename 12/23/10 0149 12/22/10 0838  WBC 8.7 7.0  HGB 18.1* 17.4*  PLT 168 161    Basename 12/23/10 0149 12/22/10 0838 12/21/10 2222  NA 136 -- 140  K 3.5 -- 4.0  CL 98 -- 101  CO2 27 -- --  GLUCOSE 103* -- 126*  BUN 14 -- 25*  CREATININE 0.83 0.86 --    Basename 12/22/10 1130 12/22/10 0841  TROPONINI <0.30 <0.30   Tele: Normal sinus rhythm  Assessment/Plan:  1. Unstable angina.  I have reviewed the patient's cardiac catheterization films. He has severe three-vessel coronary artery disease with a background of type 2 diabetes. By my review, the patient has severe 95% stenosis of the proximal-mid LAD with diffuse distal vessel LAD stenosis. He has severe complex 90% stenosis in the midcircumflex leading into the  first OM branch. He also has severe 90% stenosis of the mid right coronary artery with diffuse disease of that vessel as well.  I am going to recommend a TCTS consultation for coronary bypass surgery.  A 2-D echocardiogram will be performed to assess LV function. The patient's Ticagrelor will be discontinued.  Will discontinue Lovenox and start treatment dose unfractionated heparin. He will be continued on aspirin, a beta blocker, and an ACE inhibitor.  2. Type 2 diabetes: Continue glipizide.  3. Hypertension. Blood pressure is controlled on current medical therapy.  4.  Hyperlipidemia. Lipids were reviewed with cholesterol 168, triglycerides 257, HDL 29, and LDL 88. Will change him from simvastatin to high dose Crestor.   [name], M.D. 12/23/2010, 12:06 PM

## 2010-12-23 NOTE — Progress Notes (Signed)
*  PRELIMINARY RESULTS* Carotid dopplers performed. No ICA stenosis bilateral. Antegrade vertebral artery flow bilateral. ABIs indicate normal arterial flow bilateral.. Upper extremity:palmar arch on the right was normal with right and left compression. On the left decreased greater than 50% with radial compression. Normal with ulnar compression.  [facility] 12/23/2010, 6:45 PM

## 2010-12-23 NOTE — Consult Note (Signed)
ANTICOAGULATION CONSULT NOTE - Initial Consult  Pharmacy Consult for Heparin Indication: USAP with severe three-vessel CAD with a critical OM1 lesion.  No Known Allergies  Patient Measurements: Height: 5' 6.53" (169 cm) Weight: 215 lb 6.4 oz (97.705 kg) (scale c) IBW/kg (Calculated) : 65.03    Vital Signs: Temp: 98.6 F (37 C) (12/03 0500) Temp src: Oral (12/03 0500) BP: 144/88 mmHg (12/03 0500) Pulse Rate: 73  (12/03 0500)  Labs:  Basename 12/23/10 0149 12/22/10 1130 12/22/10 0841 12/22/10 0838 12/21/10 2222 12/21/10 2201  HGB 18.1* -- -- 17.4* -- --  HCT 49.4 -- -- 49.2 56.0* --  PLT 168 -- -- 161 -- 178  APTT -- -- -- -- -- --  LABPROT 13.8 -- -- -- -- --  INR 1.04 -- -- -- -- --  HEPARINUNFRC -- -- -- -- -- --  CREATININE 0.83 -- -- 0.86 1.10 --  CKTOTAL -- 82 88 -- -- --  CKMB -- 2.8 2.9 -- -- --  TROPONINI -- <0.30 <0.30 -- -- --   Estimated Creatinine Clearance: 116.3 ml/min (by C-G formula based on Cr of 0.83).  Medical History: Past Medical History  Diagnosis Date  . Hypertension   . Diabetes mellitus   . Asthma   . Hyperlipidemia     Medications:  Prescriptions prior to admission  Medication Sig Dispense Refill  . aspirin 81 MG tablet Take 81 mg by mouth daily.         Scheduled:    . aspirin EC  81 mg Oral Daily  . enalapril  5 mg Oral BID  . glipiZIDE  5 mg Oral QAC breakfast  . metoprolol tartrate  25 mg Oral BID  . pneumococcal 23 valent vaccine  0.5 mL Intramuscular Tomorrow-1000  . rosuvastatin  40 mg Oral QHS  . sodium chloride  3 mL Intravenous Q12H  . DISCONTD: enoxaparin  40 mg Subcutaneous Q24H  . DISCONTD: simvastatin  40 mg Oral q1800  . DISCONTD: Ticagrelor  90 mg Oral BID    Assessment: 50 yr old man with HTN, DM and hyperlipidemia who presents with chest pain and a recent cath revealing triple vessel disease involving the proximal LAD and a critical OM1 lesion.  He is being placed on Heparin infusion and referral for possible  CABG.  Goal of Therapy:  Heparin level 0.3-0.7 units/ml   Plan:  Heparin bolus 4000 units. Begin heparin infusion at 1200 units/hr. Follow-up Heparin level and CBC in 6 hours.  Kell Ferris, Elisha Headland, Pharm.D. 12/23/2010 1:04 PM

## 2010-12-24 ENCOUNTER — Observation Stay (HOSPITAL_COMMUNITY): Payer: Medicaid Other

## 2010-12-24 DIAGNOSIS — I2510 Atherosclerotic heart disease of native coronary artery without angina pectoris: Secondary | ICD-10-CM

## 2010-12-24 LAB — HEPARIN LEVEL (UNFRACTIONATED): Heparin Unfractionated: 0.21 IU/mL — ABNORMAL LOW (ref 0.30–0.70)

## 2010-12-24 LAB — HEMOGLOBIN A1C: Hgb A1c MFr Bld: 7 % — ABNORMAL HIGH (ref <5.7)

## 2010-12-24 LAB — PLATELET INHIBITION P2Y12: Platelet Function  P2Y12: 10 [PRU] — ABNORMAL LOW (ref 194–418)

## 2010-12-24 LAB — CBC
Hemoglobin: 18.5 g/dL — ABNORMAL HIGH (ref 13.0–17.0)
MCHC: 36 g/dL (ref 30.0–36.0)
RDW: 13.3 % (ref 11.5–15.5)

## 2010-12-24 MED ORDER — ALBUTEROL SULFATE (5 MG/ML) 0.5% IN NEBU
2.5000 mg | INHALATION_SOLUTION | Freq: Once | RESPIRATORY_TRACT | Status: AC
  Administered 2010-12-24: 2.5 mg via RESPIRATORY_TRACT

## 2010-12-24 NOTE — Progress Notes (Signed)
Lab reported critical lab values, MD notified and not orders given at this time._____________________D. Manson Passey RN

## 2010-12-24 NOTE — Progress Notes (Signed)
Patient ID: [facility], male   DOB: 07/02/1960, 50 y.o.   MRN: [id number]                   [address]            [place] [address]          [phone]          LOS: 3 days   Subjective: No chest pain. I have obtained the disk from cath in [place] 0ct2012  Name on disk is Keith Flores. The patient has seen the disk and name on the disk (written in arabic ) and confirms it is his study.  Objective: Vital signs in last 24 hours: Patient Vitals for the past 24 hrs:  BP Temp Temp src Pulse Resp SpO2 Weight  12/24/10 1003 144/84 mmHg 98.3 F (36.8 C) Oral 86  18  94 % -  12/24/10 0450 126/72 mmHg 97.7 F (36.5 C) - 80  18  93 % 212 lb 15.4 oz (96.6 kg)  12/23/10 2200 154/93 mmHg 98.4 F (36.9 C) Oral 77  15  92 % -  12/23/10 2137 152/87 mmHg - - 76  - - -   Wt Readings from Last 3 Encounters:  12/24/10 212 lb 15.4 oz (96.6 kg)    Hemodynamic parameters for last 24 hours:    Intake/Output from previous day: 12/03 0701 - 12/04 0700 In: 980.3 [P.O.:720; I.V.:260.3] Out: -  Intake/Output this shift: Total I/O In: 3 [I.V.:3] Out: -   Scheduled Meds:   . albuterol  2.5 mg Nebulization Once  . aspirin EC  81 mg Oral Daily  . enalapril  5 mg Oral BID  . glipiZIDE  5 mg Oral QAC breakfast  . heparin  4,000 Units Intravenous Once  . metoprolol tartrate  25 mg Oral BID  . pneumococcal 23 valent vaccine  0.5 mL Intramuscular Tomorrow-1000  . rosuvastatin  40 mg Oral QHS  . sodium chloride  3 mL Intravenous Q12H   Continuous Infusions:   . DISCONTD: heparin 1,200 Units/hr (12/24/10 0753)   PRN Meds:.sodium chloride, acetaminophen, nitroGLYCERIN, ondansetron (ZOFRAN) IV, sodium chloride  General appearance: alert, cooperative, appears stated age and no distress Neurologic: intact Heart: regular rate and rhythm, S1, S2 normal, no murmur, click, rub or gallop Lungs: clear to auscultation bilaterally Abdomen: soft, non-tender; bowel sounds  normal; no masses,  no organomegaly Extremities: extremities normal, atraumatic, no cyanosis or edema, Homans sign is negative, no sign of DVT and no edema, redness or tenderness in the calves or thighs  Lab Results: CBC: Basename 12/24/10 0713 12/23/10 1923  WBC 7.2 7.5  HGB 18.5* 18.6*  HCT 51.4 51.3  PLT 164 175   BMET:  Basename 12/23/10 0149 12/22/10 0838 12/21/10 2222  NA 136 -- 140  K 3.5 -- 4.0  CL 98 -- 101  CO2 27 -- --  GLUCOSE 103* -- 126*  BUN 14 -- 25*  CREATININE 0.83 0.86 --  CALCIUM 9.1 -- --    PT/INR:  Basename 12/23/10 0149  LABPROT 13.8  INR 1.04   Lab Results  Component Value Date   HGBA1C 7.0* 12/24/2010   P2Y12 very reduced at 10 Cath reviewed:  prox lad 70-80 om1 80% Right Mid segmental 80 No lv gram  Assessment/Plan: Will plan CABG next week after platelet function returns. Repeat labs including P2Y12  Friday poss. Cabg Monday The goals risks and alternatives of the planned surgical procedure CABG  have been discussed with the patient in detail. The risks of the procedure including death, infection, stroke, myocardial infarction, bleeding, blood transfusion have all been discussed specifically.  I have quoted [facility] Flores a 2 % of perioperative mortality and a complication rate as high as 25 %. The patient's questions have been answered.Keith Flores is willing  to proceed with the planned procedure.    Delight Ovens MD 12/24/2010 2:39 PM

## 2010-12-24 NOTE — Progress Notes (Signed)
Subjective:  No chest pain, dyspnea. Feels well.  Objective:  Vital Signs in the last 24 hours: Temp:  [97.7 F (36.5 C)-98.4 F (36.9 C)] 97.7 F (36.5 C) (12/04 0450) Pulse Rate:  [76-80] 80  (12/04 0450) Resp:  [15-18] 18  (12/04 0450) BP: (126-168)/(72-93) 126/72 mmHg (12/04 0450) SpO2:  [92 %-95 %] 93 % (12/04 0450) Weight:  [96.6 kg (212 lb 15.4 oz)] 212 lb 15.4 oz (96.6 kg) (12/04 0450)  Intake/Output from previous day: 12/03 0701 - 12/04 0700 In: 980.3 [P.O.:720; I.V.:260.3] Out: -   Physical Exam: Pt is alert and oriented, overweight male in NAD HEENT: normal Neck: JVP - normal, carotids 2+= without bruits Lungs: CTA bilaterally CV: RRR without murmur or gallop Abd: soft, NT, Positive BS, no hepatomegaly Ext: no C/C/E, distal pulses intact and equal Skin: warm/dry no rash  Lab Results:  [facility] 12/24/10 0713 12/23/10 1923  WBC 7.2 7.5  HGB 18.5* 18.6*  PLT 164 175    Basename 12/23/10 0149 12/22/10 0838 12/21/10 2222  NA 136 -- 140  K 3.5 -- 4.0  CL 98 -- 101  CO2 27 -- --  GLUCOSE 103* -- 126*  BUN 14 -- 25*  CREATININE 0.83 0.86 --    Basename 12/22/10 1130 12/22/10 0841  TROPONINI <0.30 <0.30    Cardiac Studies: PRU = 10  Assessment/Plan:  1. Chest pain with 3 vessel CAD. Pt has negative enzymes and is CP-free. His ticagrelor was discontinued yesterday but platelets are HIGHLY inhibited with PRU of 10. I would recommend that he be discharged home and return for CABG as an outpatient. I don't think he requires several days of waiting for CABG in the hospital. Dr Tyrone Sage to review films and see the patient later today.   2. Dispo - As above. If home today, he should go on ASA, enalapril, glipizide, metoprolol, and lipitor 80 mg. He should return to the hospital if any recurrent chest pain.   I am leaving town this afternoon - will discuss disposition with Rosann Auerbach and have one of our extenders check on the patient this afternoon.   [name], M.D. 12/24/2010, 9:26 AM

## 2010-12-25 ENCOUNTER — Other Ambulatory Visit: Payer: Self-pay

## 2010-12-25 DIAGNOSIS — I2510 Atherosclerotic heart disease of native coronary artery without angina pectoris: Principal | ICD-10-CM

## 2010-12-25 DIAGNOSIS — I200 Unstable angina: Secondary | ICD-10-CM

## 2010-12-25 LAB — CBC
HCT: 54 % — ABNORMAL HIGH (ref 39.0–52.0)
Hemoglobin: 19.3 g/dL — ABNORMAL HIGH (ref 13.0–17.0)
MCH: 31.2 pg (ref 26.0–34.0)
MCHC: 35.7 g/dL (ref 30.0–36.0)

## 2010-12-25 MED ORDER — NITROGLYCERIN 0.4 MG SL SUBL: 0.4000 mg | SUBLINGUAL_TABLET | SUBLINGUAL | Status: DC | PRN

## 2010-12-25 MED ORDER — METOPROLOL TARTRATE 25 MG PO TABS: 25.0000 mg | ORAL_TABLET | Freq: Two times a day (BID) | ORAL | Status: DC

## 2010-12-25 MED ORDER — ROSUVASTATIN CALCIUM 40 MG PO TABS: 40.0000 mg | ORAL_TABLET | Freq: Every day | ORAL | Status: DC

## 2010-12-25 NOTE — Progress Notes (Signed)
Subjective:  No chest pain, dyspnea. Feels well.  Objective:  Vital Signs in the last 24 hours: Temp:  [97.8 F (36.6 C)-98.1 F (36.7 C)] 97.9 F (36.6 C) (12/05 0600) Pulse Rate:  [81-86] 84  (12/05 1117) Resp:  [20] 20  (12/05 0600) BP: (100-168)/(66-95) 149/81 mmHg (12/05 1113) SpO2:  [94 %-95 %] 95 % (12/05 0600) Weight:  [211 lb 10.3 oz (96 kg)] 211 lb 10.3 oz (96 kg) (12/05 0600)  Intake/Output from previous day: 12/04 0701 - 12/05 0700 In: 423 [P.O.:420; I.V.:3] Out: -   Physical Exam: Pt is alert and oriented, overweight male in NAD HEENT: normal Neck: JVP - normal, carotids 2+= without bruits Lungs: CTA bilaterally CV: RRR without murmur or gallop Abd: soft, NT, Positive BS, no hepatomegaly Ext: no C/C/E, distal pulses intact and equal Skin: warm/dry no rash  Lab Results:  Basename 12/25/10 0830 12/24/10 0713  WBC 7.6 7.2  HGB 19.3* 18.5*  PLT 185 164    Basename 12/23/10 0149  NA 136  K 3.5  CL 98  CO2 27  GLUCOSE 103*  BUN 14  CREATININE 0.83   No results found for this basename: TROPONINI:2,CK,MB:2 in the last 72 hours  Cardiac Studies: PRU = 10  Assessment/Plan:  1. Chest pain with 3 vessel CAD. Pt has negative enzymes and is CP-free. His ticagrelor was discontinued 2 days ago. CABG is planned on Monday. He is stable to be discharged home on current medications.   He should return to the hospital if any recurrent chest pain.     Lorine Bears, M.D. 12/25/2010, 3:43 PM

## 2010-12-25 NOTE — Discharge Summary (Signed)
Discharge Summary   Patient ID: Keith Flores MRN: [id number], DOB/AGE: 1960-02-17 50 y.o. Admit date: 12/21/2010 D/C date:     12/25/2010   Primary Discharge Diagnoses:  1. 3-Vessel CAD, pending CABG tentatively Monday 12/30/10  Secondary Discharge Diagnoses:  1. HTN 2. DM 3. Asthma 4. Hyperlipidemia  Hospital Course: 50 y/o M with history of HTN, DM, HL, known 3V CAD by cath in [place] 10/2010 presented to [facility] with complaints of chest pain. He was advised to have CABG overseas but was skeptical of the healthcare in [place] so he flew here the week prior to admission to have a second opinion. He noted intermittent retrosternal CP not associated with any diaphoresis, nausea, or SOB. EKG demonstrated NSR with nonspecific ST-T abnormalities. He was admitted to the hospital and placed on ASA, Brilinta, enalapril, zocor, and metoprolol along with NTG and glipizide. Dr. Excell Seltzer saw him the following morning and reviewed the films. He called [facility] for consultation, discontinued the patient's ticagrelor along with Lovenox, and placed the patient on treatment-dose unfractionated heparin. Dr. Tyrone Sage reviewed the films and agreed with need for CABG. Unfortunately due to receiving Brilinta, this would have to be postponed pending a washout period. His PRU was 10, indicating high degree of platelet inhibition. The patient remained stable for the remainder of his hospitalization. Cardiac enzymes remained negative. Today, Dr. Kirke Corin has seen & examined him and feels he is stable for discharge. The patient will return for his CABG on Monday. I spoke with Dr. Tyrone Sage, whose office will be arranging instructions for his return including preliminary labwork for Friday 12/27/10.   Discharge Vitals: Blood pressure 149/81, pulse 84, temperature 97.9 F (36.6 C), temperature source Oral, resp. rate 20, height 5' 6.54" (1.69 m), weight 211 lb 10.3 oz (96 kg), SpO2 95.00%.  Labs: Lab Results  Component  Value Date   WBC 7.6 12/25/2010   HGB 19.3* 12/25/2010   HCT 54.0* 12/25/2010   MCV 87.2 12/25/2010   PLT 185 12/25/2010    Lab 12/23/10 0149  NA 136  K 3.5  CL 98  CO2 27  BUN 14  CREATININE 0.83  CALCIUM 9.1  PROT --  BILITOT --  ALKPHOS --  ALT --  AST --  GLUCOSE 103*   Cardiac enzymes negative x2  Lab Results  Component Value Date   CHOL 168 12/23/2010   HDL 29* 12/23/2010   LDLCALC 88 12/23/2010   TRIG 257* 12/23/2010   Lab Results  Component Value Date   DDIMER 0.37 12/21/2010    Diagnostic Studies/Procedures:  1. Chest 2 View - 12/21/2010   IMPRESSION: Peribronchial thickening.  This can be related to smoking, acute or chronic bronchitis, or asthma.    2. Review of cath films from [place] by Dr. Excell Seltzer: "By my review, the patient has severe 95% stenosis of the proximal-mid LAD with diffuse distal vessel LAD stenosis. He has severe complex 90% stenosis in the midcircumflex leading into the first OM branch. He also has severe 90% stenosis of the mid right coronary artery with diffuse disease of that vessel as well."  3. Carotid dopplers performed 12/23/10. No ICA stenosis bilateral. Antegrade vertebral artery flow bilateral. ABIs indicate normal arterial flow bilateral.. Upper extremity:palmar arch on the right was normal with right and left compression. On the left decreased greater than 50% with radial compression. Normal with ulnar compression.  Discharge Medications   Current Discharge Medication List    START taking these medications   Details  metoprolol tartrate (LOPRESSOR) 25 MG tablet Take 1 tablet (25 mg total) by mouth 2 (two) times daily. Qty: 60 tablet, Refills: 3    nitroGLYCERIN (NITROSTAT) 0.4 MG SL tablet Place 1 tablet (0.4 mg total) under the tongue every 5 (five) minutes as needed for chest pain (Up to 3 doses). Qty: 25 tablet, Refills: 4    rosuvastatin (CRESTOR) 40 MG tablet Take 1 tablet (40 mg total) by mouth at bedtime. Qty: 30 tablet, Refills:  3      CONTINUE these medications which have NOT CHANGED   Details  aspirin 81 MG tablet Take 81 mg by mouth daily.      enalapril (VASOTEC) 20 MG tablet Take 20 mg by mouth daily before breakfast.      metFORMIN (GLUCOPHAGE) 1000 MG tablet Take 1,000 mg by mouth daily. One time daily after lunch       STOP taking these medications     bisoprolol (ZEBETA) 10 MG tablet      clopidogrel (PLAVIX) 75 MG tablet      hydrochlorothiazide (MICROZIDE) 12.5 MG capsule       Per discussion with Dr. Tyrone Sage, the patient can go home on 81mg  of aspirin daily. I discussed the remainder of the patient's medications with Dr. Kirke Corin because of the deviation from his home regimen, and his final recommendations can be found above.  Disposition   The patient will be discharged in stable condition to home. Discharge Orders    Future Appointments: Provider: Department: Dept Phone: Center:   12/27/2010 11:00 AM [facility]  None     Future Orders Please Complete By Expires   Diet - low sodium heart healthy      Increase activity slowly      Comments:   Do not exert yourself. Please keep activity to a mild level only and stop if you notice any chest pain, shortness of breath, or other symptoms concerning to you. Dr. Kirke Corin feels that you may still drive but please be aware of how you are feeling.     Follow-up Information    Follow up with GERHARDT,EDWARD B, MD. (Dr. Dennie Maizes office will be calling you to arrange labwork on Friday 12/27/10 and will also give you instructions for returning for your surgery on Monday 12/30/10.)    Contact information:   [address] [place] [address] [phone]            Duration of Discharge Encounter: Greater than 30 minutes including physician and PA time.  Signed, Ronie Spies PA-C 12/25/2010, 4:50 PM

## 2010-12-25 NOTE — Progress Notes (Signed)
Pt given DC instructions and pt DC home via wc.

## 2010-12-27 ENCOUNTER — Encounter (HOSPITAL_COMMUNITY): Payer: Self-pay

## 2010-12-27 ENCOUNTER — Other Ambulatory Visit: Payer: Self-pay

## 2010-12-27 ENCOUNTER — Encounter (HOSPITAL_COMMUNITY)
Admit: 2010-12-27 | Discharge: 2010-12-27 | Disposition: A | Discharge status: Home / Self Care | Payer: Medicaid Other | Attending: Cardiothoracic Surgery | Admitting: Cardiothoracic Surgery

## 2010-12-27 ENCOUNTER — Encounter: Payer: Self-pay | Admitting: *Deleted

## 2010-12-27 ENCOUNTER — Ambulatory Visit (HOSPITAL_COMMUNITY)
Admission: RE | Admit: 2010-12-27 | Discharge: 2010-12-27 | Disposition: A | Discharge status: Home / Self Care | Payer: Medicaid Other | Source: Ambulatory Visit | Attending: Cardiothoracic Surgery | Admitting: Cardiothoracic Surgery

## 2010-12-27 DIAGNOSIS — Z01812 Encounter for preprocedural laboratory examination: Secondary | ICD-10-CM | POA: Insufficient documentation

## 2010-12-27 DIAGNOSIS — I2510 Atherosclerotic heart disease of native coronary artery without angina pectoris: Secondary | ICD-10-CM

## 2010-12-27 DIAGNOSIS — Z01810 Encounter for preprocedural cardiovascular examination: Secondary | ICD-10-CM | POA: Insufficient documentation

## 2010-12-27 DIAGNOSIS — Z01818 Encounter for other preprocedural examination: Secondary | ICD-10-CM | POA: Insufficient documentation

## 2010-12-27 HISTORY — DX: Atherosclerotic heart disease of native coronary artery without angina pectoris: I25.10

## 2010-12-27 LAB — BLOOD GAS, ARTERIAL
Acid-Base Excess: 1.1 mmol/L (ref 0.0–2.0)
Bicarbonate: 25.2 mEq/L — ABNORMAL HIGH (ref 20.0–24.0)
Drawn by: 344381
FIO2: 0.21 %
O2 Saturation: 98.6 %
Patient temperature: 98.6
TCO2: 26.4 mmol/L (ref 0–100)
pCO2 arterial: 40.5 mmHg (ref 35.0–45.0)
pH, Arterial: 7.41 (ref 7.350–7.450)
pO2, Arterial: 107 mmHg — ABNORMAL HIGH (ref 80.0–100.0)

## 2010-12-27 LAB — TYPE AND SCREEN
ABO/RH(D): A NEG
Antibody Screen: NEGATIVE

## 2010-12-27 LAB — COMPREHENSIVE METABOLIC PANEL
ALT: 89 U/L — ABNORMAL HIGH (ref 0–53)
AST: 31 U/L (ref 0–37)
Albumin: 4.2 g/dL (ref 3.5–5.2)
Alkaline Phosphatase: 76 U/L (ref 39–117)
BUN: 17 mg/dL (ref 6–23)
CO2: 24 mEq/L (ref 19–32)
Calcium: 10.2 mg/dL (ref 8.4–10.5)
Chloride: 100 mEq/L (ref 96–112)
Creatinine, Ser: 0.87 mg/dL (ref 0.50–1.35)
GFR calc Af Amer: >90 mL/min (ref >90)
GFR calc non Af Amer: >90 mL/min (ref >90)
Glucose, Bld: 126 mg/dL — ABNORMAL HIGH (ref 70–99)
Potassium: 4.4 mEq/L (ref 3.5–5.1)
Sodium: 137 mEq/L (ref 135–145)
Total Bilirubin: 1.1 mg/dL (ref 0.3–1.2)
Total Protein: 7.5 g/dL (ref 6.0–8.3)

## 2010-12-27 LAB — PROTIME-INR
INR: 0.98 (ref 0.00–1.49)
Prothrombin Time: 13.2 seconds (ref 11.6–15.2)

## 2010-12-27 LAB — URINALYSIS, ROUTINE W REFLEX MICROSCOPIC
Bilirubin Urine: NEGATIVE
Glucose, UA: NEGATIVE mg/dL
Hgb urine dipstick: NEGATIVE
Ketones, ur: NEGATIVE mg/dL
Leukocytes, UA: NEGATIVE
Nitrite: NEGATIVE
Protein, ur: NEGATIVE mg/dL
Specific Gravity, Urine: 1.018 (ref 1.005–1.030)
Urobilinogen, UA: 0.2 mg/dL (ref 0.0–1.0)
pH: 5 (ref 5.0–8.0)

## 2010-12-27 LAB — MRSA PCR SCREENING: MRSA by PCR: NEGATIVE

## 2010-12-27 LAB — APTT: aPTT: 35 seconds (ref 24–37)

## 2010-12-27 LAB — PLATELET FUNCTION ASSAY
Collagen / ADP: 102 seconds (ref 0–114)
Collagen / Epinephrine: >300 seconds (ref 0–197)

## 2010-12-27 LAB — CBC
HCT: 52.6 % — ABNORMAL HIGH (ref 39.0–52.0)
Hemoglobin: 18.5 g/dL — ABNORMAL HIGH (ref 13.0–17.0)
MCH: 30.9 pg (ref 26.0–34.0)
MCHC: 35.2 g/dL (ref 30.0–36.0)
MCV: 87.8 fL (ref 78.0–100.0)
Platelets: 191 10*3/uL (ref 150–400)
RBC: 5.99 MIL/uL — ABNORMAL HIGH (ref 4.22–5.81)
RDW: 13.6 % (ref 11.5–15.5)
WBC: 8.5 10*3/uL (ref 4.0–10.5)

## 2010-12-27 LAB — PLATELET INHIBITION P2Y12: Platelet Function  P2Y12: 125 [PRU] — ABNORMAL LOW (ref 194–418)

## 2010-12-27 LAB — ABO/RH: ABO/RH(D): A NEG

## 2010-12-27 LAB — HEMOGLOBIN A1C
Hgb A1c MFr Bld: 6.7 % — ABNORMAL HIGH (ref <5.7)
Mean Plasma Glucose: 146 mg/dL — ABNORMAL HIGH (ref <117)

## 2010-12-27 MED ORDER — CHLORHEXIDINE GLUCONATE 4 % EX LIQD: 30.0000 mL | CUTANEOUS | Status: DC

## 2010-12-27 NOTE — Progress Notes (Signed)
Call to [facility], [phone] assisted throughout entire interview.

## 2010-12-27 NOTE — Progress Notes (Signed)
Requested Alcario Drought to schedule Tax inspector for [employer]

## 2010-12-27 NOTE — Progress Notes (Signed)
Spoke with Dee at [employer], dopplers & PFT's complete, also called Resp. & they will tube the result of PFT'S to [facility]

## 2010-12-27 NOTE — Progress Notes (Signed)
Verbal order from dr gerhardt taken by Lelon Frohlich

## 2010-12-27 NOTE — Pre-Procedure Instructions (Signed)
20 Keith Flores  12/27/2010   Your procedure is scheduled on:  12/30/2010  Report to [facility] at 5:30 AM.  Call this number if you have problems the morning of surgery: [phone]   Remember:   Do not eat food:After Midnight.  May have clear liquids: up to 4 Hours before arrival.  Clear liquids include soda, tea, black coffee, apple or grape juice, broth.  Take these medicines the morning of surgery with A SIP OF WATER: metoprolol   Do not wear jewelry, make-up or nail polish.  Do not wear lotions, powders, or perfumes. You may wear deodorant.  Do not shave 48 hours prior to surgery.  Do not bring valuables to the hospital.  Contacts, dentures or bridgework may not be worn into surgery.  Leave suitcase in the car. After surgery it may be brought to your room.  For patients admitted to the hospital, checkout time is 11:00 AM the day of discharge.   Patients discharged the day of surgery will not be allowed to drive home.  Name and phone number of your driver:   Special Instructions: Incentive Spirometry - Practice and bring it with you on the day of surgery. and CHG Shower Use Special Wash: 1/2 bottle night before surgery and 1/2 bottle morning of surgery.   Please read over the following fact sheets that you were given: Pain Booklet, Coughing and Deep Breathing, Blood Transfusion Information, Open Heart Packet, MRSA Information and Surgical Site Infection Prevention

## 2010-12-29 MED ORDER — POTASSIUM CHLORIDE 2 MEQ/ML IV SOLN
80.0000 meq | INTRAVENOUS | Status: AC
  Filled 2010-12-29: qty 40

## 2010-12-29 MED ORDER — SODIUM CHLORIDE 0.9 % IV SOLN
INTRAVENOUS | Status: AC
  Filled 2010-12-29: qty 40

## 2010-12-29 MED ORDER — DEXTROSE 5 % IV SOLN
1.5000 g | INTRAVENOUS | Status: AC
  Administered 2010-12-30: 1.5 g via INTRAVENOUS
  Filled 2010-12-29: qty 1.5

## 2010-12-29 MED ORDER — PLASMA-LYTE 148 IV SOLN
INTRAVENOUS | Status: AC
  Filled 2010-12-29: qty 0.5

## 2010-12-29 MED ORDER — MAGNESIUM SULFATE 50 % IJ SOLN
40.0000 meq | INTRAMUSCULAR | Status: AC
  Filled 2010-12-29: qty 10

## 2010-12-29 MED ORDER — NITROGLYCERIN IN D5W 200-5 MCG/ML-% IV SOLN
2.0000 ug/min | INTRAVENOUS | Status: AC
  Administered 2010-12-30: 5 ug/min via INTRAVENOUS
  Filled 2010-12-29: qty 250

## 2010-12-29 MED ORDER — DEXTROSE 5 % IV SOLN
30.0000 ug/min | INTRAVENOUS | Status: AC
  Administered 2010-12-30: 5 ug/min via INTRAVENOUS
  Filled 2010-12-29: qty 2

## 2010-12-29 MED ORDER — METOPROLOL TARTRATE 12.5 MG HALF TABLET
12.5000 mg | ORAL_TABLET | Freq: Once | ORAL | Status: DC
  Filled 2010-12-29: qty 1

## 2010-12-29 MED ORDER — DEXTROSE 5 % IV SOLN
750.0000 mg | INTRAVENOUS | Status: AC
  Filled 2010-12-29: qty 750

## 2010-12-29 MED ORDER — DOPAMINE-DEXTROSE 3.2-5 MG/ML-% IV SOLN
2.0000 ug/kg/min | INTRAVENOUS | Status: AC
  Filled 2010-12-29: qty 250

## 2010-12-29 MED ORDER — SODIUM CHLORIDE 0.9 % IV SOLN
0.1000 ug/kg/h | INTRAVENOUS | Status: AC
  Administered 2010-12-30: .2 ug/kg/h via INTRAVENOUS
  Filled 2010-12-29: qty 4

## 2010-12-29 MED ORDER — VANCOMYCIN HCL 1000 MG IV SOLR
1500.0000 mg | INTRAVENOUS | Status: AC
  Administered 2010-12-30: 1500 mg via INTRAVENOUS
  Filled 2010-12-29: qty 1500

## 2010-12-29 MED ORDER — SODIUM CHLORIDE 0.9 % IV SOLN
INTRAVENOUS | Status: AC
  Administered 2010-12-30: 1 [IU]/h via INTRAVENOUS
  Filled 2010-12-29: qty 1

## 2010-12-29 MED ORDER — EPINEPHRINE HCL 1 MG/ML IJ SOLN
0.5000 ug/min | INTRAMUSCULAR | Status: AC
  Filled 2010-12-29: qty 4

## 2010-12-30 ENCOUNTER — Inpatient Hospital Stay (HOSPITAL_COMMUNITY): Payer: Medicaid Other | Admitting: Anesthesiology

## 2010-12-30 ENCOUNTER — Encounter (HOSPITAL_COMMUNITY)
Admission: RE | Disposition: A | Discharge status: Home / Self Care | Payer: Self-pay | Source: Ambulatory Visit | Attending: Cardiothoracic Surgery

## 2010-12-30 ENCOUNTER — Encounter (HOSPITAL_COMMUNITY): Payer: Self-pay | Admitting: Anesthesiology

## 2010-12-30 ENCOUNTER — Inpatient Hospital Stay (HOSPITAL_COMMUNITY)
Admission: RE | Admit: 2010-12-30 | Discharge: 2011-01-06 | DRG: 236 | Disposition: A | Discharge status: Home / Self Care | Payer: Medicaid Other | Source: Ambulatory Visit | Attending: Cardiothoracic Surgery | Admitting: Cardiothoracic Surgery

## 2010-12-30 ENCOUNTER — Inpatient Hospital Stay (HOSPITAL_COMMUNITY): Payer: Medicaid Other

## 2010-12-30 ENCOUNTER — Encounter (HOSPITAL_COMMUNITY): Payer: Self-pay | Admitting: *Deleted

## 2010-12-30 DIAGNOSIS — E785 Hyperlipidemia, unspecified: Secondary | ICD-10-CM | POA: Diagnosis present

## 2010-12-30 DIAGNOSIS — I10 Essential (primary) hypertension: Secondary | ICD-10-CM | POA: Diagnosis present

## 2010-12-30 DIAGNOSIS — Z23 Encounter for immunization: Secondary | ICD-10-CM

## 2010-12-30 DIAGNOSIS — I2510 Atherosclerotic heart disease of native coronary artery without angina pectoris: Secondary | ICD-10-CM

## 2010-12-30 DIAGNOSIS — E8779 Other fluid overload: Secondary | ICD-10-CM | POA: Diagnosis not present

## 2010-12-30 DIAGNOSIS — J45909 Unspecified asthma, uncomplicated: Secondary | ICD-10-CM | POA: Diagnosis present

## 2010-12-30 DIAGNOSIS — E119 Type 2 diabetes mellitus without complications: Secondary | ICD-10-CM | POA: Diagnosis present

## 2010-12-30 DIAGNOSIS — Z951 Presence of aortocoronary bypass graft: Secondary | ICD-10-CM

## 2010-12-30 DIAGNOSIS — I4892 Unspecified atrial flutter: Secondary | ICD-10-CM | POA: Diagnosis not present

## 2010-12-30 HISTORY — PX: CORONARY ARTERY BYPASS GRAFT: SHX141

## 2010-12-30 LAB — POCT I-STAT 3, ART BLOOD GAS (G3+)
Acid-base deficit: 1 mmol/L (ref 0.0–2.0)
Acid-base deficit: 1 mmol/L (ref 0.0–2.0)
Acid-base deficit: 5 mmol/L — ABNORMAL HIGH (ref 0.0–2.0)
Bicarbonate: 25.3 mEq/L — ABNORMAL HIGH (ref 20.0–24.0)
Bicarbonate: 24.1 mEq/L — ABNORMAL HIGH (ref 20.0–24.0)
Bicarbonate: 24.3 mEq/L — ABNORMAL HIGH (ref 20.0–24.0)
Bicarbonate: 20.8 mEq/L (ref 20.0–24.0)
O2 Saturation: 100 %
O2 Saturation: 99 %
O2 Saturation: 96 %
O2 Saturation: 93 %
Patient temperature: 31.4
Patient temperature: 37
Patient temperature: 35.5
Patient temperature: 37.6
TCO2: 27 mmol/L (ref 0–100)
TCO2: 25 mmol/L (ref 0–100)
TCO2: 26 mmol/L (ref 0–100)
TCO2: 22 mmol/L (ref 0–100)
pCO2 arterial: 32.7 mmHg — ABNORMAL LOW (ref 35.0–45.0)
pCO2 arterial: 42.2 mmHg (ref 35.0–45.0)
pCO2 arterial: 38.3 mmHg (ref 35.0–45.0)
pCO2 arterial: 40.9 mmHg (ref 35.0–45.0)
pH, Arterial: 7.471 — ABNORMAL HIGH (ref 7.350–7.450)
pH, Arterial: 7.365 (ref 7.350–7.450)
pH, Arterial: 7.404 (ref 7.350–7.450)
pH, Arterial: 7.316 — ABNORMAL LOW (ref 7.350–7.450)
pO2, Arterial: 343 mmHg — ABNORMAL HIGH (ref 80.0–100.0)
pO2, Arterial: 138 mmHg — ABNORMAL HIGH (ref 80.0–100.0)
pO2, Arterial: 74 mmHg — ABNORMAL LOW (ref 80.0–100.0)
pO2, Arterial: 76 mmHg — ABNORMAL LOW (ref 80.0–100.0)

## 2010-12-30 LAB — POCT I-STAT, CHEM 8
BUN: 12 mg/dL (ref 6–23)
Calcium, Ion: 1.09 mmol/L — ABNORMAL LOW (ref 1.12–1.32)
Chloride: 101 mEq/L (ref 96–112)
Creatinine, Ser: 0.8 mg/dL (ref 0.50–1.35)
Glucose, Bld: 209 mg/dL — ABNORMAL HIGH (ref 70–99)
HCT: 45 % (ref 39.0–52.0)
Hemoglobin: 15.3 g/dL (ref 13.0–17.0)
Potassium: 4.2 mEq/L (ref 3.5–5.1)
Sodium: 139 mEq/L (ref 135–145)
TCO2: 25 mmol/L (ref 0–100)

## 2010-12-30 LAB — POCT I-STAT 4, (NA,K, GLUC, HGB,HCT)
Glucose, Bld: 150 mg/dL — ABNORMAL HIGH (ref 70–99)
Glucose, Bld: 137 mg/dL — ABNORMAL HIGH (ref 70–99)
Glucose, Bld: 123 mg/dL — ABNORMAL HIGH (ref 70–99)
Glucose, Bld: 129 mg/dL — ABNORMAL HIGH (ref 70–99)
Glucose, Bld: 153 mg/dL — ABNORMAL HIGH (ref 70–99)
Glucose, Bld: 130 mg/dL — ABNORMAL HIGH (ref 70–99)
HCT: 50 % (ref 39.0–52.0)
HCT: 48 % (ref 39.0–52.0)
HCT: 39 % (ref 39.0–52.0)
HCT: 41 % (ref 39.0–52.0)
HCT: 39 % (ref 39.0–52.0)
HCT: 46 % (ref 39.0–52.0)
Hemoglobin: 17 g/dL (ref 13.0–17.0)
Hemoglobin: 16.3 g/dL (ref 13.0–17.0)
Hemoglobin: 13.3 g/dL (ref 13.0–17.0)
Hemoglobin: 13.9 g/dL (ref 13.0–17.0)
Hemoglobin: 13.3 g/dL (ref 13.0–17.0)
Hemoglobin: 15.6 g/dL (ref 13.0–17.0)
Potassium: 4.4 mEq/L (ref 3.5–5.1)
Potassium: 4.5 mEq/L (ref 3.5–5.1)
Potassium: 4.3 mEq/L (ref 3.5–5.1)
Potassium: 5.3 mEq/L — ABNORMAL HIGH (ref 3.5–5.1)
Potassium: 3.9 mEq/L (ref 3.5–5.1)
Potassium: 3.7 mEq/L (ref 3.5–5.1)
Sodium: 138 mEq/L (ref 135–145)
Sodium: 137 mEq/L (ref 135–145)
Sodium: 131 mEq/L — ABNORMAL LOW (ref 135–145)
Sodium: 134 mEq/L — ABNORMAL LOW (ref 135–145)
Sodium: 136 mEq/L (ref 135–145)
Sodium: 140 mEq/L (ref 135–145)

## 2010-12-30 LAB — GLUCOSE, CAPILLARY
Glucose-Capillary: 133 mg/dL — ABNORMAL HIGH (ref 70–99)
Glucose-Capillary: 101 mg/dL — ABNORMAL HIGH (ref 70–99)
Glucose-Capillary: 83 mg/dL (ref 70–99)
Glucose-Capillary: 77 mg/dL (ref 70–99)
Glucose-Capillary: 134 mg/dL — ABNORMAL HIGH (ref 70–99)
Glucose-Capillary: 199 mg/dL — ABNORMAL HIGH (ref 70–99)

## 2010-12-30 LAB — CBC
HCT: 45.4 % (ref 39.0–52.0)
Hemoglobin: 16.1 g/dL (ref 13.0–17.0)
MCV: 86 fL (ref 78.0–100.0)
RBC: 5.28 MIL/uL (ref 4.22–5.81)
WBC: 14.4 10*3/uL — ABNORMAL HIGH (ref 4.0–10.5)

## 2010-12-30 LAB — PLATELET COUNT: Platelets: 184 10*3/uL (ref 150–400)

## 2010-12-30 LAB — POCT I-STAT 3, VENOUS BLOOD GAS (G3P V)
Acid-base deficit: 1 mmol/L (ref 0.0–2.0)
Bicarbonate: 24.6 mEq/L — ABNORMAL HIGH (ref 20.0–24.0)
O2 Saturation: 85 %
Patient temperature: 31.4
TCO2: 26 mmol/L (ref 0–100)
pCO2, Ven: 33.8 mmHg — ABNORMAL LOW (ref 45.0–50.0)
pH, Ven: 7.443 — ABNORMAL HIGH (ref 7.250–7.300)
pO2, Ven: 35 mmHg (ref 30.0–45.0)

## 2010-12-30 LAB — PROTIME-INR: INR: 1.29 (ref 0.00–1.49)

## 2010-12-30 LAB — APTT: aPTT: 36 seconds (ref 24–37)

## 2010-12-30 LAB — HEMOGLOBIN AND HEMATOCRIT, BLOOD
HCT: 39.2 % (ref 39.0–52.0)
Hemoglobin: 13.7 g/dL (ref 13.0–17.0)

## 2010-12-30 SURGERY — CORONARY ARTERY BYPASS GRAFTING (CABG)
Anesthesia: General | Site: Chest | Wound class: Clean

## 2010-12-30 MED ORDER — LACTATED RINGERS IV SOLN
INTRAVENOUS | Status: DC | PRN
  Administered 2010-12-30 (×2): via INTRAVENOUS

## 2010-12-30 MED ORDER — HEMOSTATIC AGENTS (NO CHARGE) OPTIME
TOPICAL | Status: DC | PRN
  Administered 2010-12-30: 1 via TOPICAL

## 2010-12-30 MED ORDER — METOPROLOL TARTRATE 12.5 MG HALF TABLET
12.5000 mg | ORAL_TABLET | Freq: Two times a day (BID) | ORAL | Status: DC
  Administered 2010-12-31 (×2): 12.5 mg via ORAL
  Filled 2010-12-30 (×5): qty 1

## 2010-12-30 MED ORDER — ACETAMINOPHEN 160 MG/5ML PO SOLN
975.0000 mg | Freq: Four times a day (QID) | ORAL | Status: DC
  Administered 2010-12-30: 975 mg
  Filled 2010-12-30: qty 40.6

## 2010-12-30 MED ORDER — MORPHINE SULFATE 2 MG/ML IJ SOLN: 1.0000 mg | INTRAMUSCULAR | Status: AC | PRN

## 2010-12-30 MED ORDER — SODIUM CHLORIDE 0.9 % IJ SOLN
3.0000 mL | Freq: Two times a day (BID) | INTRAMUSCULAR | Status: DC
  Administered 2010-12-31 (×2): 3 mL via INTRAVENOUS

## 2010-12-30 MED ORDER — FENTANYL CITRATE 0.05 MG/ML IJ SOLN
INTRAMUSCULAR | Status: DC | PRN
  Administered 2010-12-30 (×2): 250 ug via INTRAVENOUS
  Administered 2010-12-30: 100 ug via INTRAVENOUS
  Administered 2010-12-30: 450 ug via INTRAVENOUS
  Administered 2010-12-30: 200 ug via INTRAVENOUS
  Administered 2010-12-30: 250 ug via INTRAVENOUS

## 2010-12-30 MED ORDER — LACTATED RINGERS IV SOLN
INTRAVENOUS | Status: DC | PRN
  Administered 2010-12-30: 07:00:00 via INTRAVENOUS

## 2010-12-30 MED ORDER — SODIUM CHLORIDE 0.9 % IR SOLN
Status: DC | PRN
  Administered 2010-12-30: 1000 mL

## 2010-12-30 MED ORDER — LACTATED RINGERS IV SOLN
INTRAVENOUS | Status: DC
  Administered 2010-12-30: 250 mL via INTRAVENOUS
  Administered 2010-12-30: 15:00:00 via INTRAVENOUS

## 2010-12-30 MED ORDER — METOPROLOL TARTRATE 12.5 MG HALF TABLET
ORAL_TABLET | ORAL | Status: AC
  Filled 2010-12-30: qty 1

## 2010-12-30 MED ORDER — ALBUMIN HUMAN 5 % IV SOLN
250.0000 mL | INTRAVENOUS | Status: AC | PRN
  Administered 2010-12-31: 250 mL via INTRAVENOUS

## 2010-12-30 MED ORDER — DEXTROSE 5 % IV SOLN
0.7500 g | INTRAVENOUS | Status: DC | PRN
  Administered 2010-12-30: .75 g via INTRAVENOUS

## 2010-12-30 MED ORDER — BISACODYL 5 MG PO TBEC
10.0000 mg | DELAYED_RELEASE_TABLET | Freq: Every day | ORAL | Status: DC
Start: 2010-12-31 — End: 2011-01-01
  Administered 2010-12-31: 10 mg via ORAL
  Filled 2010-12-30: qty 2

## 2010-12-30 MED ORDER — SODIUM CHLORIDE 0.9 % IJ SOLN: 3.0000 mL | INTRAMUSCULAR | Status: DC | PRN

## 2010-12-30 MED ORDER — ACETAMINOPHEN 500 MG PO TABS
1000.0000 mg | ORAL_TABLET | Freq: Four times a day (QID) | ORAL | Status: DC
  Administered 2010-12-31 – 2011-01-01 (×5): 1000 mg via ORAL
  Filled 2010-12-30 (×9): qty 2

## 2010-12-30 MED ORDER — INSULIN ASPART 100 UNIT/ML ~~LOC~~ SOLN: 0.0000 [IU] | SUBCUTANEOUS | Status: DC | PRN

## 2010-12-30 MED ORDER — PROTAMINE SULFATE 10 MG/ML IV SOLN
INTRAVENOUS | Status: DC | PRN
  Administered 2010-12-30: 250 mg via INTRAVENOUS

## 2010-12-30 MED ORDER — DEXTROSE 5 % IV SOLN
1.5000 g | Freq: Two times a day (BID) | INTRAVENOUS | Status: AC
  Administered 2010-12-30 – 2011-01-01 (×4): 1.5 g via INTRAVENOUS
  Filled 2010-12-30 (×4): qty 1.5

## 2010-12-30 MED ORDER — INSULIN ASPART 100 UNIT/ML ~~LOC~~ SOLN
0.0000 [IU] | SUBCUTANEOUS | Status: DC
  Administered 2010-12-31 – 2011-01-01 (×4): 2 [IU] via SUBCUTANEOUS
  Filled 2010-12-30: qty 3

## 2010-12-30 MED ORDER — PAPAVERINE HCL 30 MG/ML IJ SOLN
INTRAMUSCULAR | Status: DC | PRN
  Administered 2010-12-30: 60 mg

## 2010-12-30 MED ORDER — HEPARIN SODIUM (PORCINE) 1000 UNIT/ML IJ SOLN
INTRAMUSCULAR | Status: DC | PRN
  Administered 2010-12-30: 24000 [IU] via INTRAVENOUS

## 2010-12-30 MED ORDER — DOCUSATE SODIUM 100 MG PO CAPS
200.0000 mg | ORAL_CAPSULE | Freq: Every day | ORAL | Status: DC
  Administered 2010-12-31: 200 mg via ORAL
  Filled 2010-12-30: qty 2

## 2010-12-30 MED ORDER — MORPHINE SULFATE 4 MG/ML IJ SOLN
INTRAMUSCULAR | Status: AC
  Filled 2010-12-30: qty 1

## 2010-12-30 MED ORDER — PANTOPRAZOLE SODIUM 40 MG PO TBEC: 40.0000 mg | DELAYED_RELEASE_TABLET | Freq: Every day | ORAL | Status: DC

## 2010-12-30 MED ORDER — SODIUM CHLORIDE 0.9 % IV SOLN
0.1000 ug/kg/h | INTRAVENOUS | Status: DC
  Administered 2010-12-30: 0.7 ug/kg/h via INTRAVENOUS

## 2010-12-30 MED ORDER — ALBUMIN HUMAN 5 % IV SOLN
INTRAVENOUS | Status: DC | PRN
  Administered 2010-12-30 (×2): via INTRAVENOUS

## 2010-12-30 MED ORDER — LIDOCAINE HCL (CARDIAC) 20 MG/ML IV SOLN
INTRAVENOUS | Status: DC | PRN
  Administered 2010-12-30: 100 mg via INTRAVENOUS

## 2010-12-30 MED ORDER — ACETAMINOPHEN 160 MG/5ML PO SOLN
650.0000 mg | ORAL | Status: AC
  Administered 2010-12-30: 650 mg

## 2010-12-30 MED ORDER — SODIUM CHLORIDE 0.45 % IV SOLN
INTRAVENOUS | Status: DC
  Administered 2010-12-30 (×2): via INTRAVENOUS

## 2010-12-30 MED ORDER — NITROGLYCERIN IN D5W 200-5 MCG/ML-% IV SOLN
0.0000 ug/min | INTRAVENOUS | Status: DC
  Administered 2010-12-30: 16.667 ug/min via INTRAVENOUS

## 2010-12-30 MED ORDER — PHENYLEPHRINE HCL 10 MG/ML IJ SOLN
0.0000 ug/min | INTRAVENOUS | Status: DC
  Filled 2010-12-30: qty 2

## 2010-12-30 MED ORDER — PLASMA-LYTE 148 IV SOLN
INTRAVENOUS | Status: DC | PRN
  Administered 2010-12-30: 09:00:00

## 2010-12-30 MED ORDER — INSULIN ASPART 100 UNIT/ML ~~LOC~~ SOLN: 0.0000 [IU] | SUBCUTANEOUS | Status: DC

## 2010-12-30 MED ORDER — METOPROLOL TARTRATE 1 MG/ML IV SOLN: 2.5000 mg | INTRAVENOUS | Status: DC | PRN

## 2010-12-30 MED ORDER — INSULIN ASPART 100 UNIT/ML ~~LOC~~ SOLN
0.0000 [IU] | SUBCUTANEOUS | Status: AC
  Administered 2010-12-30: 4 [IU] via SUBCUTANEOUS
  Administered 2010-12-31: 2 [IU] via SUBCUTANEOUS
  Filled 2010-12-30: qty 3

## 2010-12-30 MED ORDER — FAMOTIDINE IN NACL 20-0.9 MG/50ML-% IV SOLN
20.0000 mg | Freq: Two times a day (BID) | INTRAVENOUS | Status: AC
  Administered 2010-12-30: 20 mg via INTRAVENOUS

## 2010-12-30 MED ORDER — ONDANSETRON HCL 4 MG/2ML IJ SOLN: 4.0000 mg | Freq: Four times a day (QID) | INTRAMUSCULAR | Status: DC | PRN

## 2010-12-30 MED ORDER — POTASSIUM CHLORIDE 10 MEQ/50ML IV SOLN
10.0000 meq | INTRAVENOUS | Status: AC
  Administered 2010-12-30 (×3): 10 meq via INTRAVENOUS

## 2010-12-30 MED ORDER — SODIUM CHLORIDE 0.9 % IV SOLN
0.1000 ug/kg/h | INTRAVENOUS | Status: DC
  Filled 2010-12-30 (×2): qty 2

## 2010-12-30 MED ORDER — BISACODYL 10 MG RE SUPP: 10.0000 mg | Freq: Every day | RECTAL | Status: DC

## 2010-12-30 MED ORDER — VANCOMYCIN HCL 1000 MG IV SOLR
1000.0000 mg | Freq: Once | INTRAVENOUS | Status: AC
  Administered 2010-12-30: 1000 mg via INTRAVENOUS
  Filled 2010-12-30: qty 1000

## 2010-12-30 MED ORDER — METOPROLOL TARTRATE 25 MG PO TABS
25.0000 mg | ORAL_TABLET | Freq: Once | ORAL | Status: AC
  Administered 2010-12-30: 25 mg via ORAL

## 2010-12-30 MED ORDER — METOPROLOL TARTRATE 12.5 MG HALF TABLET
ORAL_TABLET | ORAL | Status: AC
  Administered 2010-12-30: 25 mg via ORAL
  Filled 2010-12-30: qty 2

## 2010-12-30 MED ORDER — ASPIRIN 81 MG PO CHEW
324.0000 mg | CHEWABLE_TABLET | Freq: Every day | ORAL | Status: DC
Start: 2010-12-31 — End: 2011-01-01

## 2010-12-30 MED ORDER — MAGNESIUM SULFATE 40 MG/ML IJ SOLN
INTRAMUSCULAR | Status: AC
  Administered 2010-12-30: 4 g
  Filled 2010-12-30: qty 100

## 2010-12-30 MED ORDER — ASPIRIN EC 325 MG PO TBEC
325.0000 mg | DELAYED_RELEASE_TABLET | Freq: Every day | ORAL | Status: DC
  Administered 2010-12-31: 325 mg via ORAL
  Filled 2010-12-30 (×2): qty 1

## 2010-12-30 MED ORDER — SODIUM CHLORIDE 0.9 % IV SOLN
10.0000 g | INTRAVENOUS | Status: DC | PRN
  Administered 2010-12-30: 5 g via INTRAVENOUS

## 2010-12-30 MED ORDER — VECURONIUM BROMIDE 10 MG IV SOLR
INTRAVENOUS | Status: DC | PRN
  Administered 2010-12-30: 2 mg via INTRAVENOUS
  Administered 2010-12-30: 5 mg via INTRAVENOUS
  Administered 2010-12-30 (×2): 3 mg via INTRAVENOUS
  Administered 2010-12-30: 5 mg via INTRAVENOUS

## 2010-12-30 MED ORDER — PROPOFOL 10 MG/ML IV EMUL
INTRAVENOUS | Status: DC | PRN
  Administered 2010-12-30: 30 mg via INTRAVENOUS

## 2010-12-30 MED ORDER — ROCURONIUM BROMIDE 100 MG/10ML IV SOLN
INTRAVENOUS | Status: DC | PRN
  Administered 2010-12-30: 60 mg via INTRAVENOUS
  Administered 2010-12-30: 40 mg via INTRAVENOUS

## 2010-12-30 MED ORDER — MIDAZOLAM HCL 5 MG/5ML IJ SOLN
INTRAMUSCULAR | Status: DC | PRN
  Administered 2010-12-30: 2 mg via INTRAVENOUS
  Administered 2010-12-30 (×2): 4 mg via INTRAVENOUS
  Administered 2010-12-30: 2 mg via INTRAVENOUS

## 2010-12-30 MED ORDER — SODIUM CHLORIDE 0.9 % IV SOLN: 250.0000 mL | INTRAVENOUS | Status: DC

## 2010-12-30 MED ORDER — MIDAZOLAM HCL 2 MG/2ML IJ SOLN: 2.0000 mg | INTRAMUSCULAR | Status: DC | PRN

## 2010-12-30 MED ORDER — LACTATED RINGERS IV SOLN: 500.0000 mL | Freq: Once | INTRAVENOUS | Status: AC | PRN

## 2010-12-30 MED ORDER — ACETAMINOPHEN 650 MG RE SUPP: 650.0000 mg | RECTAL | Status: AC

## 2010-12-30 MED ORDER — OXYCODONE HCL 5 MG PO TABS
5.0000 mg | ORAL_TABLET | ORAL | Status: DC | PRN
  Administered 2010-12-30 – 2011-01-01 (×8): 10 mg via ORAL
  Filled 2010-12-30 (×8): qty 2

## 2010-12-30 MED ORDER — SODIUM CHLORIDE 0.9 % IV SOLN
INTRAVENOUS | Status: DC
  Administered 2010-12-30: 20 mL via INTRAVENOUS

## 2010-12-30 MED ORDER — ROSUVASTATIN CALCIUM 40 MG PO TABS
40.0000 mg | ORAL_TABLET | Freq: Every day | ORAL | Status: DC
  Administered 2010-12-31: 40 mg via ORAL
  Filled 2010-12-30 (×2): qty 1

## 2010-12-30 MED ORDER — METOPROLOL TARTRATE 25 MG/10 ML ORAL SUSPENSION
12.5000 mg | Freq: Two times a day (BID) | ORAL | Status: DC
  Filled 2010-12-30 (×5): qty 5

## 2010-12-30 MED ORDER — INSULIN REGULAR HUMAN 100 UNIT/ML IJ SOLN
INTRAMUSCULAR | Status: DC
  Administered 2010-12-30: 3.7 [IU]/h via INTRAVENOUS
  Filled 2010-12-30: qty 1

## 2010-12-30 MED ORDER — MAGNESIUM SULFATE 50 % IJ SOLN: 4.0000 g | Freq: Once | INTRAVENOUS | Status: DC

## 2010-12-30 MED ORDER — MORPHINE SULFATE 4 MG/ML IJ SOLN
2.0000 mg | INTRAMUSCULAR | Status: DC | PRN
  Administered 2010-12-30 – 2011-01-01 (×12): 4 mg via INTRAVENOUS
  Filled 2010-12-30 (×11): qty 1

## 2010-12-30 SURGICAL SUPPLY — 123 items
ADH SKN CLS APL DERMABOND .7 (GAUZE/BANDAGES/DRESSINGS) ×1
ATTRACTOMAT 16X20 MAGNETIC DRP (DRAPES) ×2 IMPLANT
BAG DECANTER FOR FLEXI CONT (MISCELLANEOUS) ×2 IMPLANT
BANDAGE ACE 4 STERILE (GAUZE/BANDAGES/DRESSINGS) ×1 IMPLANT
BANDAGE ELASTIC 4 VELCRO ST LF (GAUZE/BANDAGES/DRESSINGS) ×2 IMPLANT
BANDAGE ELASTIC 6 VELCRO ST LF (GAUZE/BANDAGES/DRESSINGS) ×2 IMPLANT
BANDAGE GAUZE ELAST BULKY 4 IN (GAUZE/BANDAGES/DRESSINGS) ×2 IMPLANT
BLADE SAW STERNAL (BLADE) ×2 IMPLANT
BLADE SURG 11 STRL SS (BLADE) ×1 IMPLANT
BLADE SURG ROTATE 9660 (MISCELLANEOUS) IMPLANT
CANISTER SUCTION 2500CC (MISCELLANEOUS) ×2 IMPLANT
CANN PRFSN .5XCNCT 15X34-48 (MISCELLANEOUS) ×1
CANNULA PRFSN .5XCNCT 15X34-48 (MISCELLANEOUS) ×1 IMPLANT
CANNULA VEN 2 STAGE (MISCELLANEOUS) ×2
CANNULA VESSEL W/WING WO/VALVE (CANNULA) IMPLANT
CATH CPB KIT GERHARDT (MISCELLANEOUS) ×2 IMPLANT
CATH ROBINSON RED A/P 18FR (CATHETERS) IMPLANT
CATH THORACIC 28FR (CATHETERS) ×2 IMPLANT
CATH THORACIC 28FR RT ANG (CATHETERS) IMPLANT
CATH THORACIC 36FR (CATHETERS) IMPLANT
CATH THORACIC 36FR RT ANG (CATHETERS) IMPLANT
CLIP FOGARTY SPRING 6M (CLIP) ×1 IMPLANT
CLIP TI MEDIUM 24 (CLIP) IMPLANT
CLIP TI WIDE RED SMALL 24 (CLIP) IMPLANT
CLOTH BEACON ORANGE TIMEOUT ST (SAFETY) ×2 IMPLANT
COVER SURGICAL LIGHT HANDLE (MISCELLANEOUS) ×4 IMPLANT
CRADLE DONUT ADULT HEAD (MISCELLANEOUS) ×2 IMPLANT
DERMABOND ADVANCED (GAUZE/BANDAGES/DRESSINGS) ×1
DERMABOND ADVANCED .7 DNX12 (GAUZE/BANDAGES/DRESSINGS) IMPLANT
DRAIN CHANNEL 28F RND 3/8 FF (WOUND CARE) ×2 IMPLANT
DRAIN CHANNEL 32F RND 10.7 FF (WOUND CARE) IMPLANT
DRAPE CARDIOVASCULAR INCISE (DRAPES) ×2
DRAPE SLUSH MACHINE 52X66 (DRAPES) IMPLANT
DRAPE SLUSH/WARMER DISC (DRAPES) IMPLANT
DRAPE SRG 135X102X78XABS (DRAPES) ×1 IMPLANT
DRSG COVADERM 4X14 (GAUZE/BANDAGES/DRESSINGS) ×2 IMPLANT
ELECT BLADE 4.0 EZ CLEAN MEGAD (MISCELLANEOUS) ×2
ELECT CAUTERY BLADE 6.4 (BLADE) ×2 IMPLANT
ELECT REM PT RETURN 9FT ADLT (ELECTROSURGICAL) ×4
ELECTRODE BLDE 4.0 EZ CLN MEGD (MISCELLANEOUS) ×1 IMPLANT
ELECTRODE REM PT RTRN 9FT ADLT (ELECTROSURGICAL) ×2 IMPLANT
GLOVE BIO SURGEON STRL SZ 6 (GLOVE) ×3 IMPLANT
GLOVE BIO SURGEON STRL SZ 6.5 (GLOVE) ×6 IMPLANT
GLOVE BIO SURGEON STRL SZ7 (GLOVE) IMPLANT
GLOVE BIO SURGEON STRL SZ7.5 (GLOVE) IMPLANT
GLOVE BIOGEL PI IND STRL 6 (GLOVE) IMPLANT
GLOVE BIOGEL PI IND STRL 6.5 (GLOVE) IMPLANT
GLOVE BIOGEL PI IND STRL 7.0 (GLOVE) IMPLANT
GLOVE BIOGEL PI INDICATOR 6 (GLOVE)
GLOVE BIOGEL PI INDICATOR 6.5 (GLOVE)
GLOVE BIOGEL PI INDICATOR 7.0 (GLOVE) ×1
GLOVE EUDERMIC 7 POWDERFREE (GLOVE) IMPLANT
GLOVE ORTHO TXT STRL SZ7.5 (GLOVE) IMPLANT
GOWN BRE IMP SLV AUR XL STRL (GOWN DISPOSABLE) ×1 IMPLANT
GOWN STRL NON-REIN LRG LVL3 (GOWN DISPOSABLE) ×12 IMPLANT
HEMOSTAT POWDER SURGIFOAM 1G (HEMOSTASIS) ×6 IMPLANT
HEMOSTAT SURGICEL 2X14 (HEMOSTASIS) ×2 IMPLANT
INSERT FOGARTY 61MM (MISCELLANEOUS) IMPLANT
INSERT FOGARTY XLG (MISCELLANEOUS) IMPLANT
KIT BASIN OR (CUSTOM PROCEDURE TRAY) ×2 IMPLANT
KIT ROOM TURNOVER OR (KITS) ×2 IMPLANT
KIT SUCTION CATH 14FR (SUCTIONS) ×4 IMPLANT
KIT VASOVIEW W/TROCAR VH 2000 (KITS) ×2 IMPLANT
LEAD PACING MYOCARDI (MISCELLANEOUS) ×2 IMPLANT
MARKER GRAFT CORONARY BYPASS (MISCELLANEOUS) ×6 IMPLANT
NS IRRIG 1000ML POUR BTL (IV SOLUTION) ×10 IMPLANT
PACK OPEN HEART (CUSTOM PROCEDURE TRAY) ×2 IMPLANT
PAD ARMBOARD 7.5X6 YLW CONV (MISCELLANEOUS) ×4 IMPLANT
PENCIL BUTTON HOLSTER BLD 10FT (ELECTRODE) ×2 IMPLANT
PUNCH AORTIC ROTATE 4.0MM (MISCELLANEOUS) IMPLANT
PUNCH AORTIC ROTATE 4.5MM 8IN (MISCELLANEOUS) ×1 IMPLANT
PUNCH AORTIC ROTATE 5MM 8IN (MISCELLANEOUS) IMPLANT
SET CARDIOPLEGIA MPS 5001102 (MISCELLANEOUS) ×1 IMPLANT
SOLUTION ANTI FOG 6CC (MISCELLANEOUS) IMPLANT
SPONGE GAUZE 4X4 12PLY (GAUZE/BANDAGES/DRESSINGS) ×4 IMPLANT
SPONGE INTESTINAL PEANUT (DISPOSABLE) IMPLANT
SPONGE LAP 18X18 X RAY DECT (DISPOSABLE) ×3 IMPLANT
SPONGE LAP 4X18 X RAY DECT (DISPOSABLE) IMPLANT
SUT BONE WAX W31G (SUTURE) IMPLANT
SUT MNCRL AB 4-0 PS2 18 (SUTURE) ×1 IMPLANT
SUT PROLENE 3 0 SH 1 (SUTURE) ×2 IMPLANT
SUT PROLENE 3 0 SH DA (SUTURE) IMPLANT
SUT PROLENE 3 0 SH1 36 (SUTURE) IMPLANT
SUT PROLENE 4 0 RB 1 (SUTURE)
SUT PROLENE 4 0 SH DA (SUTURE) IMPLANT
SUT PROLENE 4 0 TF (SUTURE) IMPLANT
SUT PROLENE 4-0 RB1 .5 CRCL 36 (SUTURE) IMPLANT
SUT PROLENE 5 0 C 1 36 (SUTURE) IMPLANT
SUT PROLENE 6 0 C 1 30 (SUTURE) ×2 IMPLANT
SUT PROLENE 6 0 CC (SUTURE) ×4 IMPLANT
SUT PROLENE 7 0 BV 1 (SUTURE) ×1 IMPLANT
SUT PROLENE 7 0 BV1 MDA (SUTURE) ×3 IMPLANT
SUT PROLENE 7.0 RB 3 (SUTURE) IMPLANT
SUT PROLENE 8 0 BV175 6 (SUTURE) ×3 IMPLANT
SUT SILK  1 MH (SUTURE)
SUT SILK 1 MH (SUTURE) IMPLANT
SUT SILK 2 0 SH CR/8 (SUTURE) IMPLANT
SUT SILK 3 0 SH CR/8 (SUTURE) IMPLANT
SUT STEEL 6MS V (SUTURE) ×1 IMPLANT
SUT STEEL STERNAL CCS#1 18IN (SUTURE) ×2 IMPLANT
SUT STEEL SZ 6 DBL 3X14 BALL (SUTURE) ×2 IMPLANT
SUT VIC AB 1 CTX 18 (SUTURE) ×4 IMPLANT
SUT VIC AB 1 CTX 36 (SUTURE)
SUT VIC AB 1 CTX36XBRD ANBCTR (SUTURE) IMPLANT
SUT VIC AB 2-0 CT1 27 (SUTURE)
SUT VIC AB 2-0 CT1 TAPERPNT 27 (SUTURE) IMPLANT
SUT VIC AB 2-0 CTX 27 (SUTURE) IMPLANT
SUT VIC AB 3-0 SH 27 (SUTURE) ×2
SUT VIC AB 3-0 SH 27X BRD (SUTURE) IMPLANT
SUT VIC AB 3-0 X1 27 (SUTURE) IMPLANT
SUT VICRYL 4-0 PS2 18IN ABS (SUTURE) IMPLANT
SUTURE E-PAK OPEN HEART (SUTURE) ×2 IMPLANT
SYSTEM SAHARA CHEST DRAIN ATS (WOUND CARE) ×2 IMPLANT
TAPE CLOTH SURG 4X10 WHT LF (GAUZE/BANDAGES/DRESSINGS) ×1 IMPLANT
TAPE PAPER 2X10 WHT MICROPORE (GAUZE/BANDAGES/DRESSINGS) ×1 IMPLANT
TOWEL OR 17X24 6PK STRL BLUE (TOWEL DISPOSABLE) ×4 IMPLANT
TOWEL OR 17X26 10 PK STRL BLUE (TOWEL DISPOSABLE) ×4 IMPLANT
TRAY FOLEY IC TEMP SENS 14FR (CATHETERS) ×2 IMPLANT
TUBE FEEDING 8FR 16IN STR KANG (MISCELLANEOUS) ×2 IMPLANT
TUBE SUCT INTRACARD DLP 20F (MISCELLANEOUS) ×2 IMPLANT
TUBING INSUFFLATION 10FT LAP (TUBING) ×2 IMPLANT
UNDERPAD 30X30 INCONTINENT (UNDERPADS AND DIAPERS) ×2 IMPLANT
WATER STERILE IRR 1000ML POUR (IV SOLUTION) ×4 IMPLANT

## 2010-12-30 NOTE — Preoperative (Signed)
Beta Blockers   Reason not to administer Beta Blockers:Not Applicable. Metoprolol @ 1830 12/29/10

## 2010-12-30 NOTE — Anesthesia Procedure Notes (Addendum)
Procedure Name: Intubation Date/Time: 12/30/2010 7:57 AM Performed by: Margaree Mackintosh Pre-anesthesia Checklist: Patient identified, Emergency Drugs available, Suction available, Patient being monitored and Timeout performed Patient Re-evaluated:Patient Re-evaluated prior to inductionOxygen Delivery Method: Circle System Utilized Preoxygenation: Pre-oxygenation with 100% oxygen Intubation Type: IV induction Ventilation: Mask ventilation without difficulty    Date/Time: 12/30/2010 8:03 AM Performed by: Margaree Mackintosh Grade View: Grade III Tube type: Oral Tube size: 8.0 mm Number of attempts: 1 Airway Equipment and Method: video-laryngoscopy and stylet Placement Confirmation: ETT inserted through vocal cords under direct vision,  positive ETCO2 and breath sounds checked- equal and bilateral Secured at: 23 cm Tube secured with: Tape Difficulty Due To: Difficulty was anticipated, Difficult Airway- due to limited oral opening and Difficult Airway- due to reduced neck mobility

## 2010-12-30 NOTE — Procedures (Addendum)
Extubation Procedure Note Late entry per RRT, CLW, RCP Patient Details:   Name: [name] DOB: [birth year] MRN: [id number]   Airway Documentation:  Airway 8 mm (Active)  Secured at (cm) 24 cm 12/30/2010  4:50 PM  Measured From Lips 12/30/2010  4:50 PM  Secured Location Right 12/30/2010  4:50 PM  Secured By Caron Presume Tape 12/30/2010  4:50 PM    Evaluation  O2 sats: currently acceptable Complications: No apparent complications Patient did tolerate procedure well. Bilateral Breath Sounds: Diminished   Yes Parameters for SICU protocol were all met. Time out was performed. Jeoffrey Massed, Lendell Caprice 12/30/2010, 6:27 PM

## 2010-12-30 NOTE — Anesthesia Postprocedure Evaluation (Signed)
  Anesthesia Post-op Note  Patient: Keith Flores  Procedure(s) Performed:  CORONARY ARTERY BYPASS GRAFTING (CABG)  Patient Location: PACU and SICU  Anesthesia Type: General  Level of Consciousness: sedated, unresponsive and Patient remains intubated per anesthesia plan  Airway and Oxygen Therapy: Patient remains intubated per anesthesia plan  Post-op Pain: none  Post-op Assessment: Post-op Vital signs reviewed, Patient's Cardiovascular Status Stable, Respiratory Function Stable, Patent Airway, No signs of Nausea or vomiting and Pain level controlled  Post-op Vital Signs: Reviewed and stable  Complications: No apparent anesthesia complications

## 2010-12-30 NOTE — Brief Op Note (Signed)
12/30/2010  11:20 AM  PATIENT:  Keith Flores  50 y.o. male  PRE-OPERATIVE DIAGNOSIS:  Coronary Artery Disease  POST-OPERATIVE DIAGNOSIS:  Coronary Artery Disease  PROCEDURE:  Procedure(s): CORONARY ARTERY BYPASS GRAFTING (CABG)x4 (LIMA to  LAD, SVG to Diagonal, SVG to OM1, SVG to PDA) with EVH from left thigh and calf.  SURGEON:  Delight Ovens, MD  PHYSICIAN ASSISTANT: Doree Fudge PA-C   ANESTHESIA:   general  EBL:  Total I/O In: 1950 [I.V.:1950] Out: 1380 [Urine:1380]    PLAN OF CARE: Admit to inpatient  to sicu  PATIENT DISPOSITION:  ICU - intubated and hemodynamically stable.   Delay start of Pharmacological VTE agent (>24hrs) due to surgical blood loss or risk of bleeding:   Pre op Weight:98 kg

## 2010-12-30 NOTE — OR Nursing (Signed)
Leg incision at 0832; chest incision at 0850.

## 2010-12-30 NOTE — Anesthesia Preprocedure Evaluation (Addendum)
Anesthesia Evaluation  Patient identified by MRN, date of birth, ID band Patient awake    Reviewed: Allergy & Precautions, H&P , NPO status , Patient's Chart, lab work & pertinent test results, reviewed documented beta blocker date and time   Airway Mallampati: III TM Distance: <3 FB Neck ROM: Full    Dental  (+) Dental Advisory Given and Teeth Intact   Pulmonary asthma ,    Pulmonary exam normal       Cardiovascular hypertension, Pt. on medications + angina at rest + CAD Regular     Neuro/Psych    GI/Hepatic   Endo/Other  Diabetes mellitus-, Oral Hypoglycemic Agents  Renal/GU      Musculoskeletal   Abdominal   Peds  Hematology   Anesthesia Other Findings   Reproductive/Obstetrics                        Anesthesia Physical Anesthesia Plan  ASA: IV  Anesthesia Plan: General   Post-op Pain Management:    Induction: Intravenous  Airway Management Planned: Oral ETT and Video Laryngoscope Planned  Additional Equipment: Arterial line, PA Cath, CVP and Ultrasound Guidance Line Placement  Intra-op Plan:   Post-operative Plan: Post-operative intubation/ventilation  Informed Consent: I have reviewed the patients History and Physical, chart, labs and discussed the procedure including the risks, benefits and alternatives for the proposed anesthesia with the patient or authorized representative who has indicated his/her understanding and acceptance.   Dental advisory given  Plan Discussed with: CRNA, Anesthesiologist and Surgeon  Anesthesia Plan Comments:        Anesthesia Quick Evaluation

## 2010-12-30 NOTE — H&P (Signed)
[address]            [place] [address]          [phone]                               [facility] Medical Record #[id number] Date of Birth: [birth year]  Referring: Dr Excell Seltzer Primary Care: none  Chief Complaint:    Chief Complaint   Patient presents with   .  Chest Pain        left sided chest pain radiates to left back     History of Present Illness:      Patient has been having on and off dull retrosternal chest pain which is not associated with any radiation, diaphoresis, nausea , vomiting or shortness of breath. His pain was vaguely described and it appears that he has not really had much pain in the last few days. He really came in because had a recent cath in [place] (oct 2012) which revealed 70-90% proximal LAD stenosis , 90-99% OM 1 stenosis, and 50-70% mid RCA stenosis. He was advised to have CABG but was skeptical of the healthcare in [place] so he flew in last week to have a second opinion and possible surgery or stent placement in the [place].    patient does have a history of diabetes that was diagnosed 3 years ago. He has no history of previous myocardial infarction. He said 2 episodes of substernal chest pain one precipitated his evaluation in [place] and 1 that are  precipitated  this admission.  Current Activity/ Functional Status: Patient is independent with mobility/ambulation, transfers, ADL's, IADL's.    Past Medical History   Diagnosis  Date   .  Hypertension     .  Diabetes mellitus   3 years   .  Asthma     .  Hyperlipidemia         Past Surgical History   Procedure  Date   .  Cardiac catheterization in [place]  OCT 2012        History   Smoking status   .  Former Smoker   .  Quit date:  10/21/2010   Smokeless tobacco   .  Never Used     History   Alcohol Use  No       No Known Allergies    Current Facility-Administered Medications   Medication  Dose  Route  Frequency  Provider  Last Rate   Last Dose   .  0.9 %  sodium chloride infusion   250 mL  Intravenous  PRN  Sunnie Nielsen, MD  10 mL/hr at 12/22/10 0505  1,000 mL at 12/22/10 0505   .  acetaminophen (TYLENOL) tablet 650 mg   650 mg  Oral  Q4H PRN  Sunnie Nielsen, MD         .  aspirin EC tablet 81 mg   81 mg  Oral  Daily  Kimberly Ballard Hammons, PHARMD     81 mg at 12/23/10 1103   .  enalapril (VASOTEC) tablet 5 mg   5 mg  Oral  BID  Sunnie Nielsen, MD     5 mg at 12/23/10 1102   .  glipiZIDE (GLUCOTROL) tablet 5 mg   5 mg  Oral  QAC breakfast  Reuel Boom  R Bensimhon, MD     5 mg at 12/23/10 1102   .  heparin 100 units/mL bolus via infusion 4,000 Units   4,000 Units  Intravenous  Once  Laurena Bering, PHARMD     4,000 Units at 12/23/10 1532   .  heparin ADULT infusion 100 units/ml (25000 units/250 ml)   1,200 Units/hr  Intravenous  Continuous  Laurena Bering, PHARMD  12 mL/hr at 12/23/10 1532  1,200 Units/hr at 12/23/10 1532   .  metoprolol tartrate (LOPRESSOR) tablet 25 mg   25 mg  Oral  BID  Sunnie Nielsen, MD     25 mg at 12/23/10 1102   .  nitroGLYCERIN (NITROSTAT) SL tablet 0.4 mg   0.4 mg  Sublingual  Q5 min PRN  Sunnie Nielsen, MD         .  ondansetron [facility]) injection 4 mg   4 mg  Intravenous  Q6H PRN  Sunnie Nielsen, MD         .  pneumococcal 23 valent vaccine (PNU-IMMUNE) injection 0.5 mL   0.5 mL  Intramuscular  Tomorrow-1000  Dolores Patty, MD         .  rosuvastatin (CRESTOR) tablet 40 mg   40 mg  Oral  QHS  Micheline Chapman, MD         .  sodium chloride 0.9 % injection 3 mL   3 mL  Intravenous  Q12H  Sunnie Nielsen, MD     3 mL at 12/23/10 1101   .  sodium chloride 0.9 % injection 3 mL   3 mL  Intravenous  PRN  Sunnie Nielsen, MD     3 mL at 12/22/10 2159   .  DISCONTD: enoxaparin (LOVENOX) injection 40 mg   40 mg  Subcutaneous  Q24H  Sunnie Nielsen, MD     40 mg at 12/23/10 1102   .  DISCONTD: simvastatin (ZOCOR) tablet 40 mg   40 mg  Oral  q1800  Sunnie Nielsen, MD     40 mg at 12/22/10 1714   .  DISCONTD: Ticagrelor (BRILINTA)  tablet 90 mg   90 mg  Oral  BID  Sunnie Nielsen, MD     90 mg at 12/23/10 1102       Prescriptions prior to admission   Medication  Sig  Dispense  Refill   .  aspirin 81 MG tablet  Take 81 mg by mouth daily.             History reviewed. father  deceased 12 high blood pressure and stroke                           Mother  Deceased 57  DM                           4 brothers and 3 sisters with hypertension                          two children healthy  17, 35                          Works in Corporate investment banker Married Wife in [place] with children currently   Review of Systems  Constitutional: Negative for fever, chills, weight loss, malaise/fatigue and diaphoresis.  HENT: Negative.   Eyes:  Negative for blurred vision, double vision, photophobia, pain, discharge and redness.  Respiratory: Positive for wheezing. Negative for sputum production and shortness of breath.   Cardiovascular: Positive for chest pain. Negative for palpitations, orthopnea, claudication, leg swelling and PND.  Gastrointestinal: Negative.   Genitourinary: Negative.   Musculoskeletal: Negative.   Skin: Negative.   Neurological: Negative.  Negative for weakness.  Endo/Heme/Allergies: Negative.   Psychiatric/Behavioral: Negative.   All other systems reviewed and are negative.    Physical Exam: BP 168/93  Pulse 79  Temp(Src) 98.4 F (36.9 C) (Oral)  Resp 18  Ht 5' 6.54" (1.69 m)  Wt 215 lb 6.4 oz (97.705 kg)  BMI 34.21 kg/m2  SpO2 95%  General appearance: alert, cooperative, appears stated age and no distress Neurologic: intact Heart: regular rate and rhythm, S1, S2 normal, no murmur, click, rub or gallop Lungs: clear to auscultation bilaterally Abdomen: soft, non-tender; bowel sounds normal; no masses,  no organomegaly Extremities: extremities normal, atraumatic, no cyanosis or edema, Homans sign is negative, no sign of DVT and no edema, redness or tenderness in the calves or thighs Intact pedal pulses 1+  dp and pt bilateral  Diagnostic Studies & Laboratory data:     Recent Radiology Findings:   Dg Chest 2 View  12/21/2010  *RADIOLOGY REPORT*  Clinical Data: Chest pain and back pain.  CHEST - 2 VIEW  Comparison: None.  Findings: Borderline cardiomegaly.   Mediastinal hilar contours within normal limits.  There is diffuse peribronchial thickening. No airspace disease or pleural effusion.  No acute bony abnormality is seen.  Degenerative changes of the thoracic spine.  IMPRESSION: Peribronchial thickening.  This can be related to smoking, acute or chronic bronchitis, or asthma.  Original Report Authenticated By: Britta Mccreedy, M.D.       Recent Lab Findings: Lab Results   Component  Value  Date     WBC  8.7  12/23/2010     HGB  18.1*  12/23/2010     HCT  49.4  12/23/2010     PLT  168  12/23/2010     GLUCOSE  103*  12/23/2010     CHOL  168  12/23/2010     TRIG  257*  12/23/2010     HDL  29*  12/23/2010     LDLCALC  88  12/23/2010     NA  136  12/23/2010     K  3.5  12/23/2010     CL  98  12/23/2010     CREATININE  0.83  12/23/2010     BUN  14  12/23/2010     CO2  27  12/23/2010     INR  1.04  12/23/2010    Lab Results  Component Value Date   WBC 8.5 12/27/2010   HGB 18.5* 12/27/2010   HCT 52.6* 12/27/2010   PLT 191 12/27/2010   GLUCOSE 126* 12/27/2010   CHOL 168 12/23/2010   TRIG 257* 12/23/2010   HDL 29* 12/23/2010   LDLCALC 88 12/23/2010   ALT 89* 12/27/2010   AST 31 12/27/2010   NA 137 12/27/2010   K 4.4 12/27/2010   CL 100 12/27/2010   CREATININE 0.87 12/27/2010   BUN 17 12/27/2010   CO2 24 12/27/2010   INR 0.98 12/27/2010   HGBA1C 6.7* 12/27/2010    Assessment / Plan:      Diabetic Significant 3 vessel coronary artery disease confirmed by Dr. Earmon Phoenix review of the films and mine.  Unfortunately the patient  was loaded with Brilinta, now off one week. P2Y12 improving on test Friday.   Plan CABG.The goals risks and alternatives of the planned surgical procedure CABG have been discussed with  the patient in detail. The risks of the procedure including death, infection, stroke, myocardial infarction, bleeding, blood transfusion have all been discussed specifically.  I have quoted [name] a 2 % of perioperative mortality and a complication rate as high as 25 %. The patient's questions have been answered.[name] is willing  to proceed with the planned procedure.    I have obtained the disk from cath in [place] 0ct2012  [facility]. The patient has seen the disk and name on the disk (written in arabic ) and confirms it is his study.   Delight Ovens MD  Beeper [phone] Office [phone] 12/30/2010 7:22 AM

## 2010-12-30 NOTE — Progress Notes (Signed)
Patient ID: Keith Flores, male   DOB: February 13, 1960, 50 y.o.   MRN: [id number]  Filed Vitals:   12/30/10 1445 12/30/10 1500 12/30/10 1515 12/30/10 1530  BP:      Pulse: 96 98 99 99  Temp: 96.3 F (35.7 C) 96.4 F (35.8 C) 96.8 F (36 C) 97.2 F (36.2 C)  TempSrc:      Resp: 12 12 12 12   Height:      Weight:      SpO2: 99% 100% 100% 100%   CI= 2.5  Awake, weaning on vent, about ready for extubation  Urine output ok, Ct output low  BMET    Component Value Date/Time   NA 136 12/30/2010 1235   K 3.9 12/30/2010 1235   CL 100 12/27/2010 1237   CO2 24 12/27/2010 1237   GLUCOSE 153* 12/30/2010 1235   BUN 17 12/27/2010 1237   CREATININE 0.87 12/27/2010 1237   CALCIUM 10.2 12/27/2010 1237   GFRNONAA >90 12/27/2010 1237   GFRAA >90 12/27/2010 1237    CBC    Component Value Date/Time   WBC 14.4* 12/30/2010 1400   RBC 5.28 12/30/2010 1400   HGB 16.1 12/30/2010 1400   HCT 45.4 12/30/2010 1400   PLT 144* 12/30/2010 1400   MCV 86.0 12/30/2010 1400   MCH 30.5 12/30/2010 1400   MCHC 35.5 12/30/2010 1400   RDW 13.3 12/30/2010 1400   LYMPHSABS 3.7 12/21/2010 2201   MONOABS 0.7 12/21/2010 2201   EOSABS 0.4 12/21/2010 2201   BASOSABS 0.1 12/21/2010 2201    A/P:  Hemodynamically stable. Continue postop plan

## 2010-12-30 NOTE — Transfer of Care (Signed)
Immediate Anesthesia Transfer of Care Note  Patient: Keith Flores  Procedure(s) Performed:  CORONARY ARTERY BYPASS GRAFTING (CABG)  Patient Location: PACU and SICU  Anesthesia Type: General  Level of Consciousness: sedated and unresponsive  Airway & Oxygen Therapy: Patient remains intubated per anesthesia plan  Post-op Assessment: Post -op Vital signs reviewed and stable  Post vital signs: stable  Complications: No apparent anesthesia complications

## 2010-12-31 ENCOUNTER — Inpatient Hospital Stay (HOSPITAL_COMMUNITY): Payer: Medicaid Other

## 2010-12-31 LAB — POCT I-STAT, CHEM 8
BUN: 15 mg/dL (ref 6–23)
Calcium, Ion: 1.14 mmol/L (ref 1.12–1.32)
Chloride: 99 mEq/L (ref 96–112)
Creatinine, Ser: 1 mg/dL (ref 0.50–1.35)
Glucose, Bld: 142 mg/dL — ABNORMAL HIGH (ref 70–99)
HCT: 46 % (ref 39.0–52.0)
Hemoglobin: 15.6 g/dL (ref 13.0–17.0)
Potassium: 4 mEq/L (ref 3.5–5.1)
Sodium: 139 mEq/L (ref 135–145)
TCO2: 27 mmol/L (ref 0–100)

## 2010-12-31 LAB — CREATININE, SERUM
Creatinine, Ser: 0.89 mg/dL (ref 0.50–1.35)
GFR calc Af Amer: >90 mL/min (ref >90)
GFR calc non Af Amer: >90 mL/min (ref >90)

## 2010-12-31 LAB — CBC
HCT: 43.7 % (ref 39.0–52.0)
MCHC: 34.1 g/dL (ref 30.0–36.0)
Platelets: 164 10*3/uL (ref 150–400)
Platelets: 152 10*3/uL (ref 150–400)
RBC: 4.76 MIL/uL (ref 4.22–5.81)
RDW: 13.7 % (ref 11.5–15.5)
RDW: 14 % (ref 11.5–15.5)
WBC: 13 10*3/uL — ABNORMAL HIGH (ref 4.0–10.5)
WBC: 12.6 10*3/uL — ABNORMAL HIGH (ref 4.0–10.5)

## 2010-12-31 LAB — GLUCOSE, CAPILLARY
Glucose-Capillary: 107 mg/dL — ABNORMAL HIGH (ref 70–99)
Glucose-Capillary: 139 mg/dL — ABNORMAL HIGH (ref 70–99)
Glucose-Capillary: 144 mg/dL — ABNORMAL HIGH (ref 70–99)
Glucose-Capillary: 154 mg/dL — ABNORMAL HIGH (ref 70–99)
Glucose-Capillary: 157 mg/dL — ABNORMAL HIGH (ref 70–99)
Glucose-Capillary: 231 mg/dL — ABNORMAL HIGH (ref 70–99)

## 2010-12-31 LAB — MAGNESIUM
Magnesium: 2.4 mg/dL (ref 1.5–2.5)
Magnesium: 2.2 mg/dL (ref 1.5–2.5)

## 2010-12-31 LAB — BASIC METABOLIC PANEL
CO2: 27 mEq/L (ref 19–32)
Chloride: 104 mEq/L (ref 96–112)
GFR calc Af Amer: >90 mL/min (ref >90)
Sodium: 138 mEq/L (ref 135–145)

## 2010-12-31 MED ORDER — INSULIN ASPART 100 UNIT/ML ~~LOC~~ SOLN
0.0000 [IU] | SUBCUTANEOUS | Status: DC
  Administered 2010-12-31: 8 [IU] via SUBCUTANEOUS
  Administered 2011-01-01 (×2): 2 [IU] via SUBCUTANEOUS
  Filled 2010-12-31: qty 3

## 2010-12-31 MED ORDER — INSULIN GLARGINE 100 UNIT/ML ~~LOC~~ SOLN
25.0000 [IU] | SUBCUTANEOUS | Status: DC
  Administered 2010-12-31 – 2011-01-02 (×3): 25 [IU] via SUBCUTANEOUS
  Filled 2010-12-31: qty 3

## 2010-12-31 MED ORDER — ALBUTEROL SULFATE HFA 108 (90 BASE) MCG/ACT IN AERS
2.0000 | INHALATION_SPRAY | Freq: Four times a day (QID) | RESPIRATORY_TRACT | Status: DC
  Administered 2011-01-01 – 2011-01-05 (×15): 2 via RESPIRATORY_TRACT
  Filled 2010-12-31 (×3): qty 6.7

## 2010-12-31 MED FILL — Magnesium Sulfate Inj 50%: INTRAMUSCULAR | Qty: 10 | Status: AC

## 2010-12-31 MED FILL — Potassium Chloride Inj 2 mEq/ML: INTRAVENOUS | Qty: 40 | Status: AC

## 2010-12-31 NOTE — Progress Notes (Signed)
Patient ID: Keith Flores, male   DOB: Nov 13, 1960, 50 y.o.   MRN: [id number] TCTS DAILY PROGRESS NOTE                   [address]            [place] [address]          [phone]      1 Day Post-Op  Procedure(s) (LRB): CORONARY ARTERY BYPASS GRAFTING (CABG) (N/A)  Total Length of Stay:  LOS: 1 day   Subjective: Stable extubated  Objective: Vital signs in last 24 hours: Wt Readings from Last 3 Encounters:  12/31/10 222 lb 14.2 oz (101.1 kg)  12/31/10 222 lb 14.2 oz (101.1 kg)  12/27/10 215 lb 6.2 oz (97.7 kg)   Temp Readings from Last 3 Encounters:  12/31/10 99.9 F (37.7 C)   12/31/10 99.9 F (37.7 C)   12/27/10 97.8 F (36.6 C) Oral   BP Readings from Last 3 Encounters:  12/31/10 108/73  12/31/10 108/73  12/27/10 123/74   Pulse Readings from Last 3 Encounters:  12/31/10 93  12/31/10 93  12/27/10 91    Hemodynamic parameters for last 24 hours: PAP: (23-50)/(11-29) 50/25 mmHg CO:  [4 L/min-8.1 L/min] 8.1 L/min CI:  [1.9 L/min/m2-3.9 L/min/m2] 3.9 L/min/m2  Intake/Output from previous day: 12/10 0701 - 12/11 0700 In: 5389.3 [P.O.:180; I.V.:3168.3; Blood:1091; IV Piggyback:950] Out: 7849 [Urine:5380; Blood:2019; Chest Tube:450] Intake/Output this shift:   Current Meds: Scheduled Meds:   . acetaminophen (TYLENOL) oral liquid 160 mg/5 mL  650 mg Per Tube NOW   Or  . acetaminophen  650 mg Rectal NOW  . acetaminophen  1,000 mg Oral Q6H   Or  . acetaminophen (TYLENOL) oral liquid 160 mg/5 mL  975 mg Per Tube Q6H  . aspirin EC  325 mg Oral Daily   Or  . aspirin  324 mg Per Tube Daily  . bisacodyl  10 mg Oral Daily   Or  . bisacodyl  10 mg Rectal Daily  . cefUROXime (ZINACEF)  IV  1.5 g Intravenous Q12H  . docusate sodium  200 mg Oral Daily  . famotidine (PEPCID) IV  20 mg Intravenous Q12H  . insulin aspart  0-24 Units Subcutaneous Q4H  . insulin aspart  0-24 Units Subcutaneous Q2H   Followed by  . insulin aspart  0-24 Units  Subcutaneous Q4H  . magnesium sulfate infusion  4 g Intravenous Once  . magnesium sulfate      . metoprolol tartrate  12.5 mg Oral BID   Or  . metoprolol tartrate  12.5 mg Per Tube BID  . morphine      . pantoprazole  40 mg Oral Q1200  . potassium chloride  10 mEq Intravenous Q1 Hr x 3  . rosuvastatin  40 mg Oral QHS  . sodium chloride  3 mL Intravenous Q12H  . vancomycin (VANCOCIN) IVPB 1000 mg/100 mL central line  1,000 mg Intravenous Once   Continuous Infusions:   . sodium chloride 20 mL/hr at 12/31/10 0700  . sodium chloride 20 mL (12/30/10 1345)  . sodium chloride    . dexmedetomidine (PRECEDEX) IV infusion 0.2 mcg/kg/hr (12/30/10 1700)  . insulin (NOVOLIN-R) infusion 0.7 Units/hr (12/30/10 1600)  . lactated ringers 20 mL/hr at 12/31/10 0700  . nitroGLYCERIN 20 mcg/min (12/30/10 1809)  . phenylephrine (NEO-SYNEPHRINE) Adult infusion 10 mcg/min (12/31/10 0700)  . DISCONTD: dexmedetomidine (PRECEDEX) IV infusion 0.5 mcg/kg/hr (12/30/10 1525)   PRN Meds:.albumin human, insulin  aspart, lactated ringers, metoprolol, midazolam, morphine injection, morphine, ondansetron (ZOFRAN) IV, oxyCODONE, sodium chloride, DISCONTD: hemostatic agents, DISCONTD: hemostatic agents, DISCONTD: hemostatic agents, DISCONTD: hemostatic agents, DISCONTD: heparin-papaverine-plasmalyte irrigation, DISCONTD: papaverine, DISCONTD: sodium chloride irrigation, DISCONTD: sodium chloride irrigation  General appearance: alert, cooperative and no distress Neurologic: intact Heart: regular rate and rhythm, S1, S2 normal, no murmur, click, rub or gallop and normal apical impulse Lungs: clear to auscultation bilaterally Abdomen: soft, non-tender; bowel sounds normal; no masses,  no organomegaly Extremities: extremities normal, atraumatic, no cyanosis or edema and Homans sign is negative, no sign of DVT  Lab Results: CBC: Basename 12/31/10 0411 12/30/10 2120 12/30/10 1400  WBC 13.0* -- 14.4*  HGB 14.6 15.3 --  HCT  41.5 45.0 --  PLT 164 -- 144*   BMET:  Basename 12/31/10 0411 12/30/10 2120  NA 138 139  K 3.8 4.2  CL 104 101  CO2 27 --  GLUCOSE 114* 209*  BUN 12 12  CREATININE 0.83 0.80  CALCIUM 7.7* --    PT/INR:  Basename 12/30/10 1400  LABPROT 16.3*  INR 1.29   Radiology: Dg Chest Portable 1 View  12/30/2010  *RADIOLOGY REPORT*  Clinical Data: Postop  PORTABLE CHEST - 1 VIEW  Comparison: 12/21/2010.  Findings: Post CABG.  Right central line tip right main pulmonary artery level.  Endotracheal tube tip 5.3 cm above the carina. Nasogastric tube courses below the diaphragm.  The tip is not included on this exam.  Mediastinal drain and left-sided chest tube in place.  Basilar atelectasis more notable on the left.  Central pulmonary vascular prominence.  No gross pneumothorax.  Cardiomegaly.  Mild prominence mediastinum.  IMPRESSION: Post CABG with various support devices appearing in appropriate position.  Basilar subsegmental atelectasis.  Central pulmonary vascular prominence.  Mild prominence of the mediastinal and cardiac silhouette.  Original Report Authenticated By: Fuller Canada, M.D.     Assessment/Plan: S/P Procedure(s) (LRB): CORONARY ARTERY BYPASS GRAFTING (CABG) (N/A) Mobilize Diuresis Diabetes control See progression orders     Delight Ovens MD  Beeper [phone] Office [phone] 12/31/2010 7:25 AM

## 2010-12-31 NOTE — Op Note (Signed)
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  SURGICAL PROCEDURE:  Coronary artery bypass grafting x4 with the left internal mammary to the left anterior descending coronary artery, reversed saphenous vein graft to the diagonal coronary artery, reversed saphenous vein graft to the distal obtuse marginal coronary artery, reversed saphenous vein graft to the posterior descending coronary artery with the left leg, thigh and calf endo vein harvesting.  SURGEON:  Sheliah Plane, MD  FIRST ASSISTANT:  Doree Fudge, PA.  BRIEF HISTORY:  The patient is a 50 year old diabetic male from [place] who was cathed in [place] in October.  Bypass surgery was recommended to him.  He returned to [facility] and was admitted by [facility], loaded with Brilinta, and surgical consultation was obtained.  Because of the patient's diabetes and three-vessel coronary artery disease, it was not felt that angioplasty or stenting was appropriate treatment long term.  Coronary artery bypass grafting was recommended to the patient. The risks and options were explained in detail.  Because the patient had been loaded with Brilinta and his platelet function studies were markedly abnormal, he was discharged home for a week to allow washout of Brilinta.  He returned on the day of surgery for bypass surgery.  DESCRIPTION OF PROCEDURE:  With Swan-Ganz and arterial line monitors in place, the patient underwent general endotracheal anesthesia without incident.  Skin of chest and legs was  prepped with Betadine and draped in the usual sterile manner.  Using the [employer] endo vein harvesting system, vein was harvested from the left thigh and calf and was of good quality and caliber.  Median sternotomy was performed.  Left internal mammary artery was dissected down as a pedicle graft.  The distal artery was divided, had good free flow.  Pericardium was opened.  Overall ventricular function appeared preserved.  The patient did have systemic inflammatory findings around his coronary arteries to a mild to moderate degree.  He was systemically heparinized.  The ascending aorta was cannulated.  The right atrium was cannulated and aortic root vent cardioplegia needle was introduced into the ascending aorta.  The patient was placed on cardiopulmonary bypass 2.4 L/minute per metered square.  Sites of anastomosis were selected and dissected out of the epicardium.  The patient's body temperature was cooled to 32 degrees. Aortic crossclamp was applied and 500 mL of cold blood potassium cardioplegia was administered with diastolic arrest of the heart. Myocardial septal temperatures were monitored throughout crossclamp. Attention was turned first to the posterior descending coronary artery. The distal right coronary artery was significantly calcified.  The patient had evidence throughout his coronary system of distal disease. The distal posterior descending coronary was opened.  A 1.5-mm probe passed distally.  Using a running 7-0 Prolene, the distal anastomosis was performed.  Attention was then turned to the distal obtuse marginal coronary artery.  The patient's distal circumflex proper was a very small vessel and high in the AV groove and not bypassable.  The obtuse marginal vessel had distal disease, but a reasonable site for anastomosis was opened and admitted a 1.5-mm probe proximally and a 1-mm probe distally.  Using a running 7-0 Prolene, distal anastomosis was performed with a  segment of reversed saphenous vein graft.  Attention was then turned to the largest of the large diagonal branch, which was opened and admitted a 1.5-mm probe distally.  Using a running 7-0 Prolene, distal anastomosis was performed with a reversed saphenous vein graft.  Intermittently, cold blood cardioplegia was administered down the vein grafts.  Attention was then turned to the left anterior descending coronary artery which was a relatively small vessel, admitted a 1-mm probe distally.  Using a running 8-0 Prolene, left internal mammary artery was anastomosed to the left anterior descending coronary artery.  With release of the bulldog on the mammary artery, there was appropriately rise in the myocardial septal temperature.  The bulldog on the mammary artery was removed with rise in myocardial septal temperature.  Bulldog was placed back on the mammary artery.  With crossclamp still in place, 3 punch aortotomies were performed and each of the 3 vein grafts anastomosed to the ascending aorta.  Air was evacuated from the grafts and aortic crossclamp was removed.  Total cross-clamp time of 86 minutes.  Sites of sites anastomosis were inspected and were free of bleeding.  The patient was rewarmed to 37 degrees.  He was then ventilated and weaned from cardiopulmonary bypass without difficulty.  He remained hemodynamically stable and was decannulated in usual fashion.  Protamine sulfate was administered. Atrial and ventricular pacing wires were applied.  Total pump time was 116 minutes.  The patient did not require any blood bank blood products during the operative procedure.  Sponge and needle count was reported as correct and he was transported to the Surgical Intensive Care Unit for further postoperative care.     Sheliah Plane, MD     EG/MEDQ  D:  12/31/2010  T:  12/31/2010  Job:  [id number]  cc:   Veverly Fells. Excell Seltzer, MD

## 2010-12-31 NOTE — Progress Notes (Signed)
Patient ID: Keith Flores, male   DOB: [birth year], 50 y.o.   MRN: [id number]  POD #1 CABG x 4 Stable day BP 118/76  Pulse 98  Temp(Src) 99.3 F (37.4 C) (Oral)  Resp 29  Ht 5\' 7"  (1.702 m)  Wt 101.1 kg (222 lb 14.2 oz)  BMI 34.91 kg/m2  SpO2 94% PM labs pending

## 2011-01-01 ENCOUNTER — Inpatient Hospital Stay (HOSPITAL_COMMUNITY): Payer: Medicaid Other

## 2011-01-01 ENCOUNTER — Encounter (HOSPITAL_COMMUNITY): Payer: Self-pay | Admitting: Cardiothoracic Surgery

## 2011-01-01 LAB — GLUCOSE, CAPILLARY
Glucose-Capillary: 106 mg/dL — ABNORMAL HIGH (ref 70–99)
Glucose-Capillary: 139 mg/dL — ABNORMAL HIGH (ref 70–99)
Glucose-Capillary: 141 mg/dL — ABNORMAL HIGH (ref 70–99)
Glucose-Capillary: 153 mg/dL — ABNORMAL HIGH (ref 70–99)
Glucose-Capillary: 158 mg/dL — ABNORMAL HIGH (ref 70–99)
Glucose-Capillary: 209 mg/dL — ABNORMAL HIGH (ref 70–99)

## 2011-01-01 LAB — CBC
HCT: 43.6 % (ref 39.0–52.0)
Hemoglobin: 15 g/dL (ref 13.0–17.0)
MCH: 30.7 pg (ref 26.0–34.0)
MCHC: 34.4 g/dL (ref 30.0–36.0)
MCV: 89.3 fL (ref 78.0–100.0)
Platelets: 159 10*3/uL (ref 150–400)
RBC: 4.88 MIL/uL (ref 4.22–5.81)
RDW: 13.9 % (ref 11.5–15.5)
WBC: 13.8 10*3/uL — ABNORMAL HIGH (ref 4.0–10.5)

## 2011-01-01 LAB — BASIC METABOLIC PANEL
BUN: 13 mg/dL (ref 6–23)
CO2: 27 mEq/L (ref 19–32)
Calcium: 8.6 mg/dL (ref 8.4–10.5)
Chloride: 100 mEq/L (ref 96–112)
Creatinine, Ser: 0.83 mg/dL (ref 0.50–1.35)
GFR calc Af Amer: >90 mL/min (ref >90)
GFR calc non Af Amer: >90 mL/min (ref >90)
Glucose, Bld: 146 mg/dL — ABNORMAL HIGH (ref 70–99)
Potassium: 4 mEq/L (ref 3.5–5.1)
Sodium: 137 mEq/L (ref 135–145)

## 2011-01-01 MED ORDER — BISACODYL 10 MG RE SUPP: 10.0000 mg | Freq: Every day | RECTAL | Status: DC | PRN

## 2011-01-01 MED ORDER — METOPROLOL TARTRATE 12.5 MG HALF TABLET
12.5000 mg | ORAL_TABLET | Freq: Two times a day (BID) | ORAL | Status: DC
  Administered 2011-01-01 (×2): 12.5 mg via ORAL
  Filled 2011-01-01 (×4): qty 1

## 2011-01-01 MED ORDER — PANTOPRAZOLE SODIUM 40 MG PO TBEC
40.0000 mg | DELAYED_RELEASE_TABLET | Freq: Every day | ORAL | Status: DC
  Administered 2011-01-02 – 2011-01-06 (×5): 40 mg via ORAL
  Filled 2011-01-01 (×5): qty 1

## 2011-01-01 MED ORDER — MAGNESIUM HYDROXIDE 400 MG/5ML PO SUSP
30.0000 mL | Freq: Every day | ORAL | Status: DC | PRN
  Administered 2011-01-03: 30 mL via ORAL
  Filled 2011-01-01: qty 30

## 2011-01-01 MED ORDER — DOCUSATE SODIUM 100 MG PO CAPS
200.0000 mg | ORAL_CAPSULE | Freq: Every day | ORAL | Status: DC
  Administered 2011-01-01 – 2011-01-05 (×6): 200 mg via ORAL
  Filled 2011-01-01 (×7): qty 2

## 2011-01-01 MED ORDER — INFLUENZA VIRUS VACC SPLIT PF IM SUSP
0.5000 mL | INTRAMUSCULAR | Status: AC
  Administered 2011-01-04: 0.5 mL via INTRAMUSCULAR
  Filled 2011-01-01: qty 0.5

## 2011-01-01 MED ORDER — PHENOL 1.4 % MT LIQD
1.0000 | OROMUCOSAL | Status: DC | PRN
  Filled 2011-01-01 (×2): qty 177

## 2011-01-01 MED ORDER — ENALAPRIL MALEATE 10 MG PO TABS
10.0000 mg | ORAL_TABLET | Freq: Every day | ORAL | Status: DC
  Administered 2011-01-02: 10 mg via ORAL
  Filled 2011-01-01 (×3): qty 1

## 2011-01-01 MED ORDER — TRAMADOL HCL 50 MG PO TABS
50.0000 mg | ORAL_TABLET | ORAL | Status: DC | PRN
  Administered 2011-01-01 – 2011-01-03 (×2): 100 mg via ORAL
  Filled 2011-01-01 (×2): qty 2

## 2011-01-01 MED ORDER — OXYCODONE HCL 5 MG PO TABS
5.0000 mg | ORAL_TABLET | ORAL | Status: DC | PRN
  Administered 2011-01-01 – 2011-01-06 (×16): 10 mg via ORAL
  Filled 2011-01-01 (×16): qty 2

## 2011-01-01 MED ORDER — BISACODYL 5 MG PO TBEC
10.0000 mg | DELAYED_RELEASE_TABLET | Freq: Every day | ORAL | Status: DC | PRN
  Administered 2011-01-01 – 2011-01-05 (×2): 10 mg via ORAL
  Filled 2011-01-01 (×3): qty 2

## 2011-01-01 MED ORDER — POVIDONE-IODINE 10 % EX SOLN
1.0000 "application " | Freq: Two times a day (BID) | CUTANEOUS | Status: DC
  Administered 2011-01-01 – 2011-01-06 (×11): 1 via TOPICAL
  Filled 2011-01-01: qty 15

## 2011-01-01 MED ORDER — SODIUM CHLORIDE 0.9 % IV SOLN
250.0000 mL | INTRAVENOUS | Status: DC | PRN
  Administered 2011-01-02: 250 mL via INTRAVENOUS

## 2011-01-01 MED ORDER — SODIUM CHLORIDE 0.9 % IJ SOLN
3.0000 mL | Freq: Two times a day (BID) | INTRAMUSCULAR | Status: DC
  Administered 2011-01-01 – 2011-01-05 (×9): 3 mL via INTRAVENOUS

## 2011-01-01 MED ORDER — ACETAMINOPHEN 325 MG PO TABS: 650.0000 mg | ORAL_TABLET | Freq: Four times a day (QID) | ORAL | Status: DC | PRN

## 2011-01-01 MED ORDER — ROSUVASTATIN CALCIUM 20 MG PO TABS
20.0000 mg | ORAL_TABLET | Freq: Every day | ORAL | Status: DC
  Administered 2011-01-01 – 2011-01-05 (×5): 20 mg via ORAL
  Filled 2011-01-01 (×6): qty 1

## 2011-01-01 MED ORDER — ENOXAPARIN SODIUM 40 MG/0.4ML ~~LOC~~ SOLN
40.0000 mg | SUBCUTANEOUS | Status: DC
  Administered 2011-01-01 – 2011-01-03 (×3): 40 mg via SUBCUTANEOUS
  Filled 2011-01-01 (×4): qty 0.4

## 2011-01-01 MED ORDER — SODIUM CHLORIDE 0.9 % IJ SOLN: 3.0000 mL | INTRAMUSCULAR | Status: DC | PRN

## 2011-01-01 MED ORDER — METFORMIN HCL 500 MG PO TABS
500.0000 mg | ORAL_TABLET | Freq: Two times a day (BID) | ORAL | Status: DC
  Administered 2011-01-01 – 2011-01-06 (×11): 500 mg via ORAL
  Filled 2011-01-01 (×13): qty 1

## 2011-01-01 MED ORDER — POTASSIUM CHLORIDE CRYS ER 20 MEQ PO TBCR
20.0000 meq | EXTENDED_RELEASE_TABLET | Freq: Every day | ORAL | Status: AC
  Administered 2011-01-01 – 2011-01-03 (×3): 20 meq via ORAL
  Filled 2011-01-01 (×3): qty 1

## 2011-01-01 MED ORDER — FUROSEMIDE 40 MG PO TABS
40.0000 mg | ORAL_TABLET | Freq: Every day | ORAL | Status: AC
  Administered 2011-01-01 – 2011-01-03 (×3): 40 mg via ORAL
  Filled 2011-01-01 (×3): qty 1

## 2011-01-01 MED ORDER — MOVING RIGHT ALONG BOOK
Freq: Once | Status: AC
  Administered 2011-01-01: 09:00:00
  Filled 2011-01-01: qty 1

## 2011-01-01 MED ORDER — GUAIFENESIN ER 600 MG PO TB12
600.0000 mg | ORAL_TABLET | Freq: Two times a day (BID) | ORAL | Status: DC | PRN
  Administered 2011-01-04: 600 mg via ORAL
  Filled 2011-01-01: qty 1

## 2011-01-01 MED ORDER — INSULIN ASPART 100 UNIT/ML ~~LOC~~ SOLN
0.0000 [IU] | Freq: Three times a day (TID) | SUBCUTANEOUS | Status: DC
  Administered 2011-01-01: 8 [IU] via SUBCUTANEOUS
  Administered 2011-01-01 – 2011-01-02 (×2): 2 [IU] via SUBCUTANEOUS
  Administered 2011-01-02: 8 [IU] via SUBCUTANEOUS
  Administered 2011-01-02: 4 [IU] via SUBCUTANEOUS
  Administered 2011-01-02: 2 [IU] via SUBCUTANEOUS
  Administered 2011-01-03: 4 [IU] via SUBCUTANEOUS
  Administered 2011-01-03: 2 [IU] via SUBCUTANEOUS
  Administered 2011-01-04: 4 [IU] via SUBCUTANEOUS
  Administered 2011-01-04 (×2): 2 [IU] via SUBCUTANEOUS
  Administered 2011-01-04: 4 [IU] via SUBCUTANEOUS
  Administered 2011-01-05 (×2): 2 [IU] via SUBCUTANEOUS
  Administered 2011-01-05: 4 [IU] via SUBCUTANEOUS
  Administered 2011-01-06 (×2): 2 [IU] via SUBCUTANEOUS

## 2011-01-01 NOTE — Progress Notes (Signed)
Patient ID: Keith Flores, male   DOB: 07/08/1960, 50 y.o.   MRN: [id number] TCTS DAILY PROGRESS NOTE                   [address]            [place] [address]          [phone]      2 Days Post-Op  Procedure(s) (LRB): CORONARY ARTERY BYPASS GRAFTING (CABG) (N/A)  Total Length of Stay:  LOS: 2 days   Subjective: Walking in unit, wants to know when he will feel "good" no complaints   Objective: Vital signs in last 24 hours: Patient Vitals for the past 24 hrs:  BP Temp Temp src Pulse Resp SpO2 Weight  01/01/11 0737 - 98.8 F (37.1 C) Oral - - - -  01/01/11 0700 161/90 mmHg - - 105  34  94 % -  01/01/11 0600 140/102 mmHg - - 110  40  94 % -  01/01/11 0500 155/82 mmHg - - 113  32  93 % 223 lb 1.7 oz (101.2 kg)  01/01/11 0419 - 98.9 F (37.2 C) Oral - - - -  01/01/11 0400 155/84 mmHg - - - 39  93 % -  01/01/11 0300 152/86 mmHg - - 108  32  92 % -  01/01/11 0200 133/71 mmHg - - 104  34  93 % -  01/01/11 0100 148/79 mmHg - - 102  30  95 % -  01/01/11 0000 140/82 mmHg - - 99  26  94 % -  12/31/10 2356 - 98.7 F (37.1 C) Oral - - - -  12/31/10 2300 128/81 mmHg - - 104  29  94 % -  12/31/10 2200 138/77 mmHg - - 112  27  93 % -  12/31/10 2100 117/66 mmHg - - 104  27  95 % -  12/31/10 2000 135/72 mmHg - - 108  27  94 % -  12/31/10 1944 - 98.4 F (36.9 C) Oral - - - -  12/31/10 1900 129/67 mmHg - - 113  27  92 % -  12/31/10 1800 141/89 mmHg - - 106  26  92 % -  12/31/10 1700 134/68 mmHg - - 102  31  93 % -  12/31/10 1600 119/70 mmHg 99.3 F (37.4 C) Oral 100  24  93 % -  12/31/10 1530 - - - 100  24  95 % -  12/31/10 1500 132/80 mmHg - - 100  24  93 % -  12/31/10 1400 126/79 mmHg - - 99  28  96 % -  12/31/10 1300 141/89 mmHg - - 103  20  89 % -  12/31/10 1200 118/76 mmHg - - 98  29  94 % -  12/31/10 1153 - 98.3 F (36.8 C) Oral - - - -  12/31/10 1100 119/74 mmHg - - 98  19  94 % -  12/31/10 1000 114/75 mmHg - - 100  19  93 % -  12/31/10 0900 124/75  mmHg - - 98  20  95 % -   Wt Readings from Last 3 Encounters:  01/01/11 223 lb 1.7 oz (101.2 kg)  01/01/11 223 lb 1.7 oz (101.2 kg)  12/27/10 215 lb 6.2 oz (97.7 kg)    Hemodynamic parameters for last 24 hours:    Intake/Output from previous day: 12/11 0701 - 12/12 0700 In: 906.5 [P.O.:380; I.V.:426.5; IV Piggyback:100] Out:  1120 [Urine:1080; Chest Tube:40] Intake/Output this shift:   Current Meds: Scheduled Meds:   . acetaminophen  1,000 mg Oral Q6H   Or  . acetaminophen (TYLENOL) oral liquid 160 mg/5 mL  975 mg Per Tube Q6H  . albuterol  2 puff Inhalation Q6H  . aspirin EC  325 mg Oral Daily   Or  . aspirin  324 mg Per Tube Daily  . bisacodyl  10 mg Oral Daily   Or  . bisacodyl  10 mg Rectal Daily  . cefUROXime (ZINACEF)  IV  1.5 g Intravenous Q12H  . docusate sodium  200 mg Oral Daily  . famotidine (PEPCID) IV  20 mg Intravenous Q12H  . insulin aspart  0-24 Units Subcutaneous Q4H  . insulin aspart  0-24 Units Subcutaneous Q4H  . insulin aspart  0-24 Units Subcutaneous Q4H  . insulin glargine  25 Units Subcutaneous Q0700  . magnesium sulfate LVP 250-500 ml  4 g Intravenous Once  . metoprolol tartrate  12.5 mg Oral BID   Or  . metoprolol tartrate  12.5 mg Per Tube BID  . pantoprazole  40 mg Oral Q1200  . rosuvastatin  40 mg Oral QHS  . sodium chloride  3 mL Intravenous Q12H   Continuous Infusions:   . sodium chloride 20 mL/hr at 12/31/10 0700  . sodium chloride 20 mL (12/30/10 1345)  . sodium chloride    . dexmedetomidine (PRECEDEX) IV infusion 0.2 mcg/kg/hr (12/30/10 1700)  . insulin (NOVOLIN-R) infusion 0.7 Units/hr (12/30/10 1600)  . lactated ringers 20 mL/hr at 01/01/11 0700  . nitroGLYCERIN 20 mcg/min (12/30/10 1809)  . phenylephrine (NEO-SYNEPHRINE) Adult infusion Stopped (12/31/10 0758)   PRN Meds:.albumin human, insulin aspart, metoprolol, midazolam, morphine, ondansetron (ZOFRAN) IV, oxyCODONE, sodium chloride  General appearance: alert, cooperative  and no distress Neurologic: intact Heart: regular rate and rhythm, S1, S2 normal, no murmur, click, rub or gallop Lungs: clear to auscultation bilaterally Abdomen: soft, non-tender; bowel sounds normal; no masses,  no organomegaly Extremities: extremities normal, atraumatic, no cyanosis or edema, Homans sign is negative, no sign of DVT and no edema, redness or tenderness in the calves or thighs sternum stable  Lab Results: CBC: Basename 01/01/11 0400 12/31/10 1920  WBC 13.8* 12.6*  HGB 15.0 14.9  HCT 43.6 43.7  PLT 159 152   BMET:  Basename 01/01/11 0400 12/31/10 1920 12/31/10 1757 12/31/10 0411  NA 137 -- 139 --  K 4.0 -- 4.0 --  CL 100 -- 99 --  CO2 27 -- -- 27  GLUCOSE 146* -- 142* --  BUN 13 -- 15 --  CREATININE 0.83 0.89 -- --  CALCIUM 8.6 -- -- 7.7*    PT/INR:  Basename 12/30/10 1400  LABPROT 16.3*  INR 1.29   Radiology: Dg Chest Portable 1 View In Am  01/01/2011  *RADIOLOGY REPORT*  Clinical Data: Cardiac bypass  PORTABLE CHEST - 1 VIEW  Comparison: 12/31/2010  Findings: Swan-Ganz catheter, mediastinal drain left chest tube all removed.  Right IJ vascular sheath in the upper SVC.  Very low lung volumes persist with basilar atelectasis/airspace disease and small effusions.  Central vascular congestion noted.  No pneumothorax. Stable exam.  IMPRESSION: Stable postoperative portable chest exam  Original Report Authenticated By: Judie Petit. Ruel Favors, M.D.   Dg Chest Portable 1 View In Am  12/31/2010  *RADIOLOGY REPORT*  Clinical Data: Status post CABG  PORTABLE CHEST - 1 VIEW  Comparison: 12/30/2010  Findings: The patient has been extubated.  Nasogastric tube has been  removed.  Swan-Ganz catheter is in stable position.  Left chest tube remains in place without evidence of pneumothorax. There is increase in bilateral lower lobe atelectasis.  No edema. Stable mediastinal contours.  IMPRESSION: Increase in bilateral lower lobe atelectasis.  No pneumothorax visualized.  Original  Report Authenticated By: Reola Calkins, M.D.   Dg Chest Portable 1 View  12/30/2010  *RADIOLOGY REPORT*  Clinical Data: Postop  PORTABLE CHEST - 1 VIEW  Comparison: 12/21/2010.  Findings: Post CABG.  Right central line tip right main pulmonary artery level.  Endotracheal tube tip 5.3 cm above the carina. Nasogastric tube courses below the diaphragm.  The tip is not included on this exam.  Mediastinal drain and left-sided chest tube in place.  Basilar atelectasis more notable on the left.  Central pulmonary vascular prominence.  No gross pneumothorax.  Cardiomegaly.  Mild prominence mediastinum.  IMPRESSION: Post CABG with various support devices appearing in appropriate position.  Basilar subsegmental atelectasis.  Central pulmonary vascular prominence.  Mild prominence of the mediastinal and cardiac silhouette.  Original Report Authenticated By: Fuller Canada, M.D.     Assessment/Plan: S/P Procedure(s) (LRB): CORONARY ARTERY BYPASS GRAFTING (CABG) (N/A) Diuresis Diabetes control d/c tubes/lines Plan for transfer to step-down: see transfer orders     Delight Ovens MD  Beeper [phone] Office [phone] 01/01/2011 8:52 AM

## 2011-01-01 NOTE — Progress Notes (Signed)
CARDIAC REHAB PHASE I   PRE:  Rate/Rhythm: 118 ST  BP:  Supine:   Sitting: 149/85  Standing:    SaO2: 90 RA  MODE:  Ambulation: 350 ft   POST:  Rate/Rhythem: 122 ST  BP:  Supine:   Sitting: 169/86  Standing:    SaO2: 87 RA 94 2L 1345-1425 On arrival pt coming from bathroom, taken from there to ambulate. Assisted X 1 and used walker. Gait strady with walker.Pt  moans throughout walk and has short shallow breathing. RA sat after walk 87% O2 replaced 2l sat improved to 94%. To recliner after walk with call light in reach. Complaining of incisional pain reported to RN  Kizzie Bane, Nila Nephew

## 2011-01-01 NOTE — Progress Notes (Signed)
D/C'd patients IJ per MD order and hospital policy. End intact.  No signs of bleeding.  Held pressure for five minutes.  Will continue to monitor.

## 2011-01-01 NOTE — Progress Notes (Signed)
Pt quit smoking in October of 2012 and has remained tobacco free since. Congratulated and encouraged pt to remain tobacco free. Discussed relapse prevention strategies. Referred to 1-800 quit now for f/u and support. Discussed oral fixation substitutes, second hand smoke and in home smoking policy. Reviewed and gave pt Written education/contact information.

## 2011-01-02 ENCOUNTER — Inpatient Hospital Stay (HOSPITAL_COMMUNITY): Payer: Medicaid Other

## 2011-01-02 LAB — CBC
HCT: 44.2 % (ref 39.0–52.0)
Hemoglobin: 15.3 g/dL (ref 13.0–17.0)
MCH: 31 pg (ref 26.0–34.0)
MCHC: 34.6 g/dL (ref 30.0–36.0)
MCV: 89.7 fL (ref 78.0–100.0)
Platelets: 179 10*3/uL (ref 150–400)
RBC: 4.93 MIL/uL (ref 4.22–5.81)
RDW: 13.7 % (ref 11.5–15.5)
WBC: 14 10*3/uL — ABNORMAL HIGH (ref 4.0–10.5)

## 2011-01-02 LAB — BASIC METABOLIC PANEL
BUN: 16 mg/dL (ref 6–23)
CO2: 31 mEq/L (ref 19–32)
Calcium: 9 mg/dL (ref 8.4–10.5)
Chloride: 97 mEq/L (ref 96–112)
Creatinine, Ser: 0.96 mg/dL (ref 0.50–1.35)
GFR calc Af Amer: >90 mL/min (ref >90)
GFR calc non Af Amer: >90 mL/min (ref >90)
Glucose, Bld: 137 mg/dL — ABNORMAL HIGH (ref 70–99)
Potassium: 3.9 mEq/L (ref 3.5–5.1)
Sodium: 135 mEq/L (ref 135–145)

## 2011-01-02 LAB — GLUCOSE, CAPILLARY
Glucose-Capillary: 149 mg/dL — ABNORMAL HIGH (ref 70–99)
Glucose-Capillary: 208 mg/dL — ABNORMAL HIGH (ref 70–99)
Glucose-Capillary: 194 mg/dL — ABNORMAL HIGH (ref 70–99)
Glucose-Capillary: 150 mg/dL — ABNORMAL HIGH (ref 70–99)

## 2011-01-02 MED ORDER — METOPROLOL TARTRATE 25 MG PO TABS
25.0000 mg | ORAL_TABLET | Freq: Two times a day (BID) | ORAL | Status: DC
  Administered 2011-01-02 – 2011-01-06 (×9): 25 mg via ORAL
  Filled 2011-01-02 (×10): qty 1

## 2011-01-02 MED ORDER — DEXTROSE 5 % IV SOLN
30.0000 mg/h | INTRAVENOUS | Status: DC
  Filled 2011-01-02: qty 9

## 2011-01-02 MED ORDER — ASPIRIN 325 MG PO TBEC: 325.0000 mg | DELAYED_RELEASE_TABLET | Freq: Every day | ORAL | Status: AC

## 2011-01-02 MED ORDER — FUROSEMIDE 40 MG PO TABS: 40.0000 mg | ORAL_TABLET | Freq: Every day | ORAL | Status: DC

## 2011-01-02 MED ORDER — AMIODARONE HCL 400 MG PO TABS: 400.0000 mg | ORAL_TABLET | Freq: Two times a day (BID) | ORAL | Status: DC

## 2011-01-02 MED ORDER — PANTOPRAZOLE SODIUM 40 MG PO TBEC: 40.0000 mg | DELAYED_RELEASE_TABLET | Freq: Every day | ORAL | Status: DC

## 2011-01-02 MED ORDER — POTASSIUM CHLORIDE CRYS ER 20 MEQ PO TBCR: 20.0000 meq | EXTENDED_RELEASE_TABLET | Freq: Every day | ORAL | Status: DC

## 2011-01-02 MED ORDER — ASPIRIN 81 MG PO TBEC: 325.0000 mg | DELAYED_RELEASE_TABLET | Freq: Every day | ORAL | Status: DC

## 2011-01-02 MED ORDER — DEXTROSE 5 % IV SOLN
60.0000 mg/h | INTRAVENOUS | Status: AC
  Filled 2011-01-02: qty 9

## 2011-01-02 MED ORDER — AMIODARONE HCL 200 MG PO TABS
400.0000 mg | ORAL_TABLET | Freq: Two times a day (BID) | ORAL | Status: DC
  Administered 2011-01-02 – 2011-01-06 (×8): 400 mg via ORAL
  Filled 2011-01-02 (×10): qty 2

## 2011-01-02 MED ORDER — ASPIRIN EC 325 MG PO TBEC
325.0000 mg | DELAYED_RELEASE_TABLET | Freq: Every day | ORAL | Status: DC
  Administered 2011-01-02 – 2011-01-06 (×5): 325 mg via ORAL
  Filled 2011-01-02 (×6): qty 1

## 2011-01-02 MED ORDER — ASPIRIN 325 MG PO TABS: 325.0000 mg | ORAL_TABLET | Freq: Every day | ORAL | Status: DC

## 2011-01-02 MED ORDER — DEXTROSE 5 % IV SOLN
1.0000 mg/min | INTRAVENOUS | Status: DC
  Administered 2011-01-02: 1 mg/min via INTRAVENOUS
  Filled 2011-01-02 (×3): qty 9

## 2011-01-02 MED ORDER — AMIODARONE HCL 200 MG PO TABS: 400.0000 mg | ORAL_TABLET | Freq: Every day | ORAL | Status: DC

## 2011-01-02 MED ORDER — OXYCODONE HCL 5 MG PO TABS: 5.0000 mg | ORAL_TABLET | ORAL | Status: AC | PRN

## 2011-01-02 MED ORDER — LACTULOSE 10 GM/15ML PO SOLN
20.0000 g | Freq: Once | ORAL | Status: AC
  Administered 2011-01-02: 20 g via ORAL
  Filled 2011-01-02: qty 30

## 2011-01-02 MED ORDER — MAGIC MOUTHWASH
5.0000 mL | Freq: Three times a day (TID) | ORAL | Status: DC | PRN
Start: 2011-01-02 — End: 2011-01-06
  Filled 2011-01-02: qty 5

## 2011-01-02 MED ORDER — DEXTROSE 5 % IV SOLN
150.0000 mg | Freq: Once | INTRAVENOUS | Status: AC
  Administered 2011-01-02: 150 mg via INTRAVENOUS
  Filled 2011-01-02: qty 3

## 2011-01-02 MED ORDER — GUAIFENESIN ER 600 MG PO TB12: 600.0000 mg | ORAL_TABLET | Freq: Two times a day (BID) | ORAL | Status: DC | PRN

## 2011-01-02 NOTE — Progress Notes (Addendum)
3 Days Post-Op Procedure(s) (LRB): CORONARY ARTERY BYPASS GRAFTING (CABG) (N/A)  Subjective: Patient with incisional pain, complaints of constipation, and a sore throat this am.  Objective: Vital signs in last 24 hours: Patient Vitals for the past 24 hrs:  BP Temp Temp src Pulse Resp SpO2 Weight  01/02/11 0455 133/72 mmHg 99.6 F (37.6 C) Oral 105  20  90 % 219 lb 12.8 oz (99.7 kg)  01/02/11 0207 - - - - - 91 % -  01/01/11 2210 136/80 mmHg 98 F (36.7 C) Oral 111  20  91 % -  01/01/11 2118 135/79 mmHg - - 132  - - -  01/01/11 1951 - - - - - 91 % -  01/01/11 1537 - - - - - 93 % -  01/01/11 1300 149/85 mmHg 98.9 F (37.2 C) Oral 114  20  92 % -   Pre op weight  97.5 kg Current Weight  01/02/11 219 lb 12.8 oz (99.7 kg)      Intake/Output from previous day: 12/12 0701 - 12/13 0700 In: 560 [P.O.:390; I.V.:120; IV Piggyback:50] Out: 775 [Urine:775]   Physical Exam:  Cardiovascular: Slightly tachycardic, no murmurs, gallops, or rubs. Pulmonary: Slightly decreased at bases bilaterally; no rales, wheezes, or rhonchi. Abdomen: Soft, non tender, some distention,bowel sounds present. Extremities: Mild bilateral lower extremity edema. Wounds: Clean and dry.  No erythema or signs of infection.  Lab Results: CBC: Basename 01/01/11 0400 12/31/10 1920  WBC 13.8* 12.6*  HGB 15.0 14.9  HCT 43.6 43.7  PLT 159 152   BMET:  Basename 01/01/11 0400 12/31/10 1920 12/31/10 1757 12/31/10 0411  NA 137 -- 139 --  K 4.0 -- 4.0 --  CL 100 -- 99 --  CO2 27 -- -- 27  GLUCOSE 146* -- 142* --  BUN 13 -- 15 --  CREATININE 0.83 0.89 -- --  CALCIUM 8.6 -- -- 7.7*    PT/INR:  Basename 12/30/10 1400  LABPROT 16.3*  INR 1.29   ABG:  INR: Will add last result for INR, ABG once components are confirmed Will add last 4 CBG results once components are confirmed  Assessment/Plan:  1. CV - SR/ST. On Lopressor 12.5 bid. Will increase to 25 bid. Continue Enalapril 10 daily. 2.  Pulmonary -  Encourage incentive spirometer.Wean O2 as tolerates.CXR today shows bibasilar atx and small bilateral pleural effusions, low lung volumes, and some pulmonary vascular congestion. 3. Volume Overload - Continue diuresis. 4.DM-CBGs 209/106/153. Continue Metformin 500 bid. Will stop scheduled Insulin . HGA1C 6.7 pre op. 5.Continue CRPI 6.LOC for constipation. 7.Regarding sore throat, chloraseptic not really helping. No signs of thrush. Monitor.   Keith,Keith M, PA-C 01/02/2011   developed rapid afib stared on cordarone Now in sinus  I have seen and examined Keith Flores and agree with the above assessment  and plan.  Delight Ovens MD Beeper [phone] Office [phone] 01/02/2011 6:33 PM

## 2011-01-02 NOTE — Progress Notes (Signed)
   CARE MANAGEMENT NOTE 01/02/2011  Patient:  Keith Flores,Keith Flores   Account Number:  [id number]  Date Initiated:  12/31/2010  Documentation initiated by:  Avie Arenas  Subjective/Objective Assessment:   Post op CABG x4on 12-30-10     Action/Plan:   PTA, PT INDEPENDENT, LIVES WITH SPOUSE.  PT STATES FAMILY IS CURRENTLY ON VACATION IN [place] AND WILL NOT RETURN FOR SEVERAL WEEKS.   Anticipated DC Date:  01/03/2011   Anticipated DC Plan:  SKILLED NURSING FACILITY  In-house referral  Clinical Social Worker      DC Planning Services  CM consult      Choice offered to / List presented to:             Status of service:  In process, will continue to follow Medicare Important Message given?   (If response is "NO", the following Medicare IM given date fields will be blank) Date Medicare IM given:   Date Additional Medicare IM given:    Discharge Disposition:    Per UR Regulation:  Reviewed for med. necessity/level of care/duration of stay  Comments:  01/02/11 Calogero Geisen,RN,BSN 1215 MET WITH PT TO DISCUSS DISCHARGE PLANS.  PT STATES HE HAS NO CAREGIVER AVAILABLE AT DISCHARGE.  WILL NEED SHORT TERM SNF FOR REHAB, IF POSSIBLE.  PT IS A CITIZEN, HAS APPLIED FOR MEDICAID.  WILL CONSULT CSW TO FACILITATE DISCHARGE TO SNF WHEN MEDICALLY STABLE. Phone #[phone]  12-31-10 11:20am Avie Arenas, RNBSN - [phone] UR Completed.

## 2011-01-02 NOTE — Progress Notes (Signed)
Pt with rapid heart rate (A-fib vs. A-flutter), rate 180-200, BP 139/60, brief 'drop' to 120, 10 a.m. dose of lopressor 50 mg given early,  then rate increased and sustained 180-190.  Doree Fudge, PA aware.  Amio gtt started with 150 mg bolus.  After bolus rate 140-150's, BP 118/66.  Patient asymptomatic during this time, sleeping, forehead clammy.  O2 sats 92% one liter.  Monitoring. Baird Lyons 9:27 AM

## 2011-01-02 NOTE — Progress Notes (Signed)
CARDIAC REHAB PHASE I   PRE:  Rate/Rhythm: 116ST  BP:  Supine:   Sitting: 135/69  Standing:    SaO2: 93%1L  MODE:  Ambulation: 550 ft   POST:  Rate/Rhythem: 125 ST  BP:  Supine:   Sitting: 137/72  Standing:    SaO2: 89-90%2L 1130-1205 Pt walked 521ft on O2 at 2L With rolling walker and asst x 2.  Hr to 125 during walk. To bathroom after walk. To recliner with call bell. O2 sats 89-90% on 2L  After walk.  Duanne Limerick

## 2011-01-02 NOTE — Discharge Summary (Addendum)
Physician Discharge Summary  Patient ID: Keith Flores MRN: [id number] DOB/AGE: 09/04/1960 50 y.o.  Admit date: 12/30/2010 Discharge date: 01/06/2011  Admission Diagnoses: 1.History of multivessel CAD 2.History of hypertension 3.History of diabetes mellitus 4.History of hyperlipidemia 5.History of tobacco abuse  Discharge Diagnoses:  1.History of multivessel CAD 2.History of hypertension 3.History of diabetes mellitus 4.History of hyperlipidemia 5.History of tobacco abuse 6.Afib/flutter   Procedure (s):  1. Coronary artery bypass grafting x4 ( left internal mammary to the left anterior descending coronary artery, reversed saphenous vein graft to the diagonal coronary artery, reversed saphenous vein graft to the distal obtuse marginal coronary artery,  reversed saphenous vein graft to the posterior descending coronary  Artery)with the left leg, thigh and calf endo vein harvesting by Dr. Tyrone Sage 12/30/2010.  History of Presenting Illness: This is a 50 year old [place] male who has been having on and off dull retrosternal chest pain, which is not associated with any radiation, diaphoresis, nausea , vomiting or shortness of breath. His pain was vaguely described and it appears that he has not really had much pain in the last few days. He really came in because he had a recent cath in [place] (oct 2012) which revealed 70-90% proximal LAD stenosis , 90-99% OM 1 stenosis, and 50-70% mid RCA stenosis. He was advised to have CABG but was skeptical of the healthcare in [place]. As a result,  he flew in last week to have a second opinion and possible surgery or stent placement in the [place]. He does have a history of diabetes that was diagnosed 3 years ago. He has no history of previous myocardial infarction. He said 2 episodes of substernal chest pain one precipitated his evaluation in [place] and 1 that are precipitated this admission. A cardiothoracic consultation was obtained with Dr. Tyrone Sage for  consideration of coronary artery bypass grafting surgery at the time of his original admission on 12/23/2010. The patient had been loaded with Derinda Late to and as a result I bypass grafting surgery would have to be delayed for several days. A copy of the patient's cardiac catheterization) on CD was obtained by Dr. Tyrone Sage and reviewed. Potential risks, complications, and benefits of the surgery discussed with the patient and he agreed to proceed. Is admitted [facility] 12/30/2010 in order to undergo coronary bypass grafting surgery x4.  Brief Hospital Course:  Patient was extubated early evening of surgery without difficulty. He  remained afebrile and hemodynamically stable. Swan-Ganz, A-line, chest tubes, and Foley were removed early in his post operative course. He was started on a low-dose beta blocker. He was found to be volume overloaded and diuresed accordingly the previously stated, he history of diabetes mellitus. Was weaned off his insulin drip and restarted on his oral medication of Metformin.  His glucose remained fairly well controlled. His HGA1C pre oop was 6.7. He will need to  Follow up with his medical  doctor upon discharge. He was felt surgically stable for transfer from the intensive care unit to [facility] for further convalescence on 01/01/2011.       he did have complaints of sore throat. There is no evidence of thrush. Chloraseptic spray did not help. He was given that mouth wash. He then developed atrial fibrillation/flutter with RVR. He was given an amiodarone bolus followed by an Amiodarone drip.Provided  he converts to sinus rhythm, he will be continued on amiodarone by mouth. In addition, his Lopressor was increased from 12.5 mg by mouth 2 times daily to 25 mg by  mouth 2 times daily. His are then tolerating a diet and is passing flatus. His epicardial pacing wires and chest tube sutures will be removed provided.  His IV the infiltrated (Amiodarone). He developed warmth, swelling,  tenderness. He was placed on Keflex. He has remained in SR for the past 24 hours.Provided he remains afebrile, hemodynamically stable,  and pending around evaluation, he was surgically stable for discharge on 01/06/2011.  Filed Vitals:   01/02/11 0455  BP: 134/79  Pulse: 92  Temp: 98.3 F (36.8 C)  Resp: 18     Latest Vital Signs: Blood pressure 134/79, pulse 92, temperature 98.3 F (36.8 C), temperature source Oral, resp. rate 18, height 5\' 7"  (1.702 m), weight 210 lb 12.8 oz (95.3 kg), SpO2 92.00%.  Physical Exam: Cardiovascular: Slightly tachycardic, no murmurs, gallops, or rubs;later in the morning he went into afib/flutter. Pulmonary: Slightly decreased at bases bilaterally; no rales, wheezes, or rhonchi.  Abdomen: Soft, non tender, some distention,bowel sounds present.  Extremities: Mild bilateral lower extremity edema.  Wounds: Clean and dry. No erythema or signs of infection.   Discharge Condition:Stable  Recent laboratory studies:  Lab Results  Component Value Date   WBC 14.0* 01/02/2011   HGB 15.3 01/02/2011   HCT 44.2 01/02/2011   MCV 89.7 01/02/2011   PLT 179 01/02/2011   Lab Results  Component Value Date   NA 135 01/02/2011   K 3.9 01/02/2011   CL 97 01/02/2011   CO2 31 01/02/2011   CREATININE 0.96 01/02/2011   GLUCOSE 137* 01/02/2011      Diagnostic Studies: Dg Chest 2 View  01/02/2011  *RADIOLOGY REPORT*  Clinical Data: CABG  CHEST - 2 VIEW  Comparison: Chest radiograph 01/01/2011  Findings: Interval removal of right IJ sheath.  Sternotomy wires overlie stable cardiac silhouette.  There is bibasilar atelectasis and small effusions unchanged from prior.  Slight increased interstitial edema pattern.  Low lung volumes.  IMPRESSION:  1.  Slightly increased interstitial edema pattern. 2.  Stable atelectasis, effusions, and low lung volumes.  Original Report Authenticated By: Genevive Bi, M.D.    Discharge Orders    Future Appointments: Provider:  Department: Dept Phone: Center:   01/30/2011 9:30 AM Delight Ovens, MD [facility] [phone] [facility]      Discharge Medications: Current Discharge Medication List    START taking these medications   Details  amiodarone (PACERONE) 400 MG tablet Take 1 tablet (400 mg total) by mouth 2 (two) times daily. For 5 days; then take 1/2 tablet (Amiodarone 200 mg) po two times daily therafter. Qty: 60 tablet, Refills: 1    aspirin EC 325 MG EC tablet Take 1 tablet (325 mg total) by mouth daily. Qty: 30 tablet    furosemide (LASIX) 40 MG tablet Take 1 tablet (40 mg total) by mouth daily. For 4 days then stop. Qty: 4 tablet, Refills: 0    guaiFENesin (MUCINEX) 600 MG 12 hr tablet Take 1 tablet (600 mg total) by mouth every 12 (twelve) hours as needed for congestion.    oxyCODONE (OXY IR/ROXICODONE) 5 MG immediate release tablet Take 1-2 tablets (5-10 mg total) by mouth every 4 (four) hours as needed for pain. Qty: 45 tablet, Refills: 0         potassium chloride SA (K-DUR,KLOR-CON) 20 MEQ tablet Take 1 tablet (20 mEq total) by mouth daily. For 4 days then stop. Qty: 4 tablet, Refills: 0      CONTINUE these medications which have NOT CHANGED   Details  enalapril (VASOTEC) 10 MG tablet Take 10 mg by mouth daily before breakfast.      metFORMIN (GLUCOPHAGE) 1000 MG tablet Take 1,000 mg by mouth daily. One time daily after lunch     metoprolol tartrate (LOPRESSOR) 25 MG tablet Take 1 tablet (25 mg total) by mouth 2 (two) times daily. Qty: 60 tablet, Refills: 3    rosuvastatin (CRESTOR) 40 MG tablet Take 1 tablet (40 mg total) by mouth at bedtime. Qty: 30 tablet, Refills: 3      STOP taking these medications     aspirin 81 MG tablet      nitroGLYCERIN (NITROSTAT) 0.4 MG SL tablet         Follow Up Appointments: Follow-up Information    Follow up with GERHARDT,EDWARD B, MD. (PA/LAT CXR to be taken on 01/30/2011 at 8:45 am;Appointment with Dr. Elby Showers is on 01/30/2011 at 9:30  am)    Contact information:   [address] [place] [address] [phone]       Follow up with [name], MD. (Call for follow up appointment in 2 weeks)    Contact information:   [address] [place] [address] [phone]       Follow up with Medical doctor. (Call for an appointment regarding HGA1C 6.7)          Signed: Ardelle Balls, PA-C 01/02/2011, 10:29 AM

## 2011-01-03 LAB — GLUCOSE, CAPILLARY
Glucose-Capillary: 119 mg/dL — ABNORMAL HIGH (ref 70–99)
Glucose-Capillary: 167 mg/dL — ABNORMAL HIGH (ref 70–99)
Glucose-Capillary: 129 mg/dL — ABNORMAL HIGH (ref 70–99)

## 2011-01-03 LAB — CBC
MCHC: 33.4 g/dL (ref 30.0–36.0)
MCV: 89.2 fL (ref 78.0–100.0)
Platelets: 185 10*3/uL (ref 150–400)
RDW: 13.6 % (ref 11.5–15.5)
WBC: 10.5 10*3/uL (ref 4.0–10.5)

## 2011-01-03 MED ORDER — ENALAPRIL MALEATE 5 MG PO TABS: 5.0000 mg | ORAL_TABLET | Freq: Every day | ORAL | Status: DC

## 2011-01-03 MED ORDER — METOPROLOL TARTRATE 25 MG PO TABS: 25.0000 mg | ORAL_TABLET | Freq: Two times a day (BID) | ORAL | Status: DC

## 2011-01-03 MED ORDER — POTASSIUM CHLORIDE CRYS ER 20 MEQ PO TBCR: 20.0000 meq | EXTENDED_RELEASE_TABLET | Freq: Every day | ORAL | Status: DC

## 2011-01-03 MED ORDER — FUROSEMIDE 40 MG PO TABS: 40.0000 mg | ORAL_TABLET | Freq: Every day | ORAL | Status: DC

## 2011-01-03 MED ORDER — AMIODARONE HCL 400 MG PO TABS: 400.0000 mg | ORAL_TABLET | Freq: Two times a day (BID) | ORAL | Status: DC

## 2011-01-03 MED FILL — Electrolyte-R (PH 7.4) Solution: INTRAVENOUS | Qty: 5000 | Status: AC

## 2011-01-03 MED FILL — Sodium Chloride IV Soln 0.9%: INTRAVENOUS | Qty: 1000 | Status: AC

## 2011-01-03 MED FILL — Sodium Chloride Irrigation Soln 0.9%: Qty: 3000 | Status: AC

## 2011-01-03 MED FILL — Heparin Sodium (Porcine) Inj 1000 Unit/ML: INTRAMUSCULAR | Qty: 30 | Status: AC

## 2011-01-03 NOTE — Progress Notes (Signed)
CARDIAC REHAB PHASE I   PRE:  Rate/Rhythm: 100 SR  BP:  Supine:   Sitting: 120/80  Standing:    SaO2: 96 1L 92-93 RA  MODE:  Ambulation: 890 ft   POST:  Rate/Rhythem: 104  BP:  Supine:   Sitting: 122/70  Standing:    SaO2: 92 RA 1350-1430 Assisted X 1 with hand held assist to ambulate. Gait steady. Walked 890 ft. without c/o. Back to recliner with call light in reach. O2 d/c after walk sat 92% on RA. Encouraged use of I.S.   Beatrix Fetters

## 2011-01-03 NOTE — Progress Notes (Addendum)
4 Days Post-Op Procedure(s) (LRB): CORONARY ARTERY BYPASS GRAFTING (CABG) (N/A)  Subjective: Patient with bowel movement yesterday. Still has sore and somewhat hoarse throat.  Objective: Vital signs in last 24 hours: Patient Vitals for the past 24 hrs:  BP Temp Temp src Pulse Resp SpO2 Weight  01/02/11 0455 133/72 mmHg 99.6 F (37.6 C) Oral 105  20  90 % 219 lb 12.8 oz (99.7 kg)  01/02/11 0207 - - - - - 91 % -  01/01/11 2210 136/80 mmHg 98 F (36.7 C) Oral 111  20  91 % -  01/01/11 2118 135/79 mmHg - - 132  - - -  01/01/11 1951 - - - - - 91 % -  01/01/11 1537 - - - - - 93 % -  01/01/11 1300 149/85 mmHg 98.9 F (37.2 C) Oral 114  20  92 % -   Pre op weight  97.5 kg Current Weight  01/03/11 222 lb 0.1 oz (100.7 kg)      Intake/Output from previous day: 12/13 0701 - 12/14 0700 In: 2218.3 [I.V.:2218.3] Out: -    Physical Exam:  Cardiovascular: RRR no murmurs, gallops, or rubs. Pulmonary: Slightly decreased at bases bilaterally; no rales, wheezes, or rhonchi. Abdomen: Soft, non tender, some distention,bowel sounds present. Extremities: Mild bilateral lower extremity edema L>R Wounds: Clean and dry.  No erythema or signs of infection.  Lab Results: CBC:  Basename 01/03/11 0540 01/02/11 0650  WBC 10.5 14.0*  HGB 13.5 15.3  HCT 40.4 44.2  PLT 185 179   BMET:   Basename 01/02/11 0650 01/01/11 0400  NA 135 137  K 3.9 4.0  CL 97 100  CO2 31 27  GLUCOSE 137* 146*  BUN 16 13  CREATININE 0.96 0.83  CALCIUM 9.0 8.6    PT/INR: No results found for this basename: LABPROT,INR in the last 72 hours ABG:  INR: Will add last result for INR, ABG once components are confirmed Will add last 4 CBG results once components are confirmed  Assessment/Plan:  1. CV - SR/ST. On Lopressor 12.5 bid. Will increase to 25 bid. Continue Enalapril 10 daily. Went into afib/flutter with RVR yesterday morning. Was on an Amiodarone drip. Has converted to SR so has been change to po  Amiodarone.  Will stop Enalapril as SBP will not tolerate right now. Continue to monitor. 2.  Pulmonary - Encourage incentive spirometer.Wean O2 as tolerates.   3. Volume Overload - Continue diuresis. 4.DM-CBGs 194/150/119. Continue Metformin 500 bid. Will stop scheduled Insulin . HGA1C 6.7 pre op. 5.Continue CRPI 6.Placement will be an issue-will likely go to SNF.   Ardelle Balls, PA-C 01/03/2011    01/03/2011 7:25 AM

## 2011-01-03 NOTE — Progress Notes (Signed)
CSW faxed pt out to [facility] and surrounding areas as Medicaid pending. Will follow up with bed offers Monday morning.  Baxter Flattery, MSW [phone]

## 2011-01-04 LAB — GLUCOSE, CAPILLARY
Glucose-Capillary: 173 mg/dL — ABNORMAL HIGH (ref 70–99)
Glucose-Capillary: 122 mg/dL — ABNORMAL HIGH (ref 70–99)
Glucose-Capillary: 120 mg/dL — ABNORMAL HIGH (ref 70–99)
Glucose-Capillary: 187 mg/dL — ABNORMAL HIGH (ref 70–99)

## 2011-01-04 MED ORDER — ENALAPRIL MALEATE 5 MG PO TABS
5.0000 mg | ORAL_TABLET | Freq: Every day | ORAL | Status: DC
  Administered 2011-01-04: 5 mg via ORAL
  Filled 2011-01-04 (×2): qty 1

## 2011-01-04 MED ORDER — GUAIFENESIN ER 600 MG PO TB12
600.0000 mg | ORAL_TABLET | Freq: Two times a day (BID) | ORAL | Status: DC
  Administered 2011-01-04 – 2011-01-06 (×5): 600 mg via ORAL
  Filled 2011-01-04 (×6): qty 1

## 2011-01-04 MED ORDER — POTASSIUM CHLORIDE CRYS ER 20 MEQ PO TBCR
40.0000 meq | EXTENDED_RELEASE_TABLET | Freq: Two times a day (BID) | ORAL | Status: AC
  Administered 2011-01-04 (×2): 40 meq via ORAL
  Filled 2011-01-04 (×2): qty 2

## 2011-01-04 MED ORDER — FUROSEMIDE 40 MG PO TABS
40.0000 mg | ORAL_TABLET | Freq: Two times a day (BID) | ORAL | Status: AC
  Administered 2011-01-04 (×2): 40 mg via ORAL
  Filled 2011-01-04 (×2): qty 1

## 2011-01-04 MED ORDER — ENOXAPARIN SODIUM 40 MG/0.4ML ~~LOC~~ SOLN
40.0000 mg | SUBCUTANEOUS | Status: DC
  Administered 2011-01-05: 40 mg via SUBCUTANEOUS
  Filled 2011-01-04 (×3): qty 0.4

## 2011-01-04 NOTE — Progress Notes (Signed)
Late entry for 1000: Pt. Has order to d/c EPW. Pt. Had 3 bts of VT and rate increases to 140's when ambulating. Notified TCTS PA in reference to discontinuing EPW. PA instructed to monitor pt until 1400 for more arythmias and to continue with EPW d/c orders if no further problems. Harlow Asa

## 2011-01-04 NOTE — Progress Notes (Addendum)
5 Days Post-Op Procedure(s) (LRB): CORONARY ARTERY BYPASS GRAFTING (CABG) (N/A)  Subjective: Patient with complaints of a cough and has been up since 2 am;still with some hoarseness and sore throat.  Objective: Vital signs in last 24 hours: Patient Vitals for the past 24 hrs:  BP Temp Temp src Pulse Resp SpO2 Weight  01/02/11 0455 133/72 mmHg 99.6 F (37.6 C) Oral 105  20  90 % 219 lb 12.8 oz (99.7 kg)  01/02/11 0207 - - - - - 91 % -  01/01/11 2210 136/80 mmHg 98 F (36.7 C) Oral 111  20  91 % -  01/01/11 2118 135/79 mmHg - - 132  - - -  01/01/11 1951 - - - - - 91 % -  01/01/11 1537 - - - - - 93 % -  01/01/11 1300 149/85 mmHg 98.9 F (37.2 C) Oral 114  20  92 % -   Pre op weight  97.5 kg Current Weight  01/03/11 222 lb 0.1 oz (100.7 kg)      Intake/Output from previous day: 12/14 0701 - 12/15 0700 In: 340 [P.O.:340] Out: -    Physical Exam:  Cardiovascular: RRR no murmurs, gallops, or rubs. Pulmonary: Slightly decreased at bases bilaterally; no rales, wheezes, or rhonchi. Abdomen: Soft, non tender, some distention,bowel sounds present. Extremities: Mild bilateral lower extremity edema L>R Wounds: Clean and dry.  No erythema or signs of infection.  Lab Results: CBC:  Basename 01/03/11 0540 01/02/11 0650  WBC 10.5 14.0*  HGB 13.5 15.3  HCT 40.4 44.2  PLT 185 179   BMET:   Basename 01/02/11 0650  NA 135  K 3.9  CL 97  CO2 31  GLUCOSE 137*  BUN 16  CREATININE 0.96  CALCIUM 9.0    PT/INR: No results found for this basename: LABPROT,INR in the last 72 hours ABG:  INR: Will add last result for INR, ABG once components are confirmed Will add last 4 CBG results once components are confirmed  Assessment/Plan:  1. CV - Previous afib/flutter with RVR. HR increased briefly into 160's last night around 7 :30 pm. SR this am. Continue Lopressor 25 bid, Amiodarone 400 bid.Will restart low dose Enalapril. 2.  Pulmonary - Encourage incentive spirometer.Wean O2 as  tolerates.   3. Volume Overload - Continue diuresis. 4.DM-CBGs 167/129/173 . Continue Metformin 500 bid. Will stop scheduled Insulin . HGA1C 6.7 pre op. 5.Continue CRPI 6.Mucinex for cough. 7.Possible home in am.   Ardelle Balls, PA-C 01/04/2011    01/04/2011 7:42 AM   Agree with above.  Weight is still above preop. Will give bid lasix and kcl today

## 2011-01-04 NOTE — Progress Notes (Addendum)
CARDIAC REHAB PHASE I   PRE:  Rate/Rhythm: Sinus Tach 100  BP:  Supine:   Sitting: 142/60  Standing:    SaO2: 97 2 1 L nasal cannula decreased to to 1L  MODE:  Ambulation: 480  ft   POST:  Rate/Rhythm: Sinus Tach 103  BP:  Supine:   Sitting: 144/70  Standing:    SaO2: 97 on 1 Liter nasal cannula  Patient ambulated in the hallway on 1 liter of oxygen without complaints. Patient assisted back to recliner with call bell within reach.  Encouraged patient to use flutter valve.  10:30-11:00 am.  Discharge education reviewed with patient via [facility] [phone]. Mr Keith Flores is interested in outpatient cardiac rehab. Patient given application for financial assistance.    Harlon Flor, Arta Bruce

## 2011-01-05 LAB — BASIC METABOLIC PANEL
CO2: 22 mEq/L (ref 19–32)
GFR calc non Af Amer: 82 mL/min — ABNORMAL LOW (ref >90)
Glucose, Bld: 202 mg/dL — ABNORMAL HIGH (ref 70–99)
Potassium: 4.4 mEq/L (ref 3.5–5.1)
Sodium: 131 mEq/L — ABNORMAL LOW (ref 135–145)

## 2011-01-05 LAB — GLUCOSE, CAPILLARY
Glucose-Capillary: 171 mg/dL — ABNORMAL HIGH (ref 70–99)
Glucose-Capillary: 105 mg/dL — ABNORMAL HIGH (ref 70–99)
Glucose-Capillary: 127 mg/dL — ABNORMAL HIGH (ref 70–99)
Glucose-Capillary: 155 mg/dL — ABNORMAL HIGH (ref 70–99)

## 2011-01-05 MED ORDER — POTASSIUM CHLORIDE CRYS ER 20 MEQ PO TBCR: 20.0000 meq | EXTENDED_RELEASE_TABLET | Freq: Every day | ORAL | Status: DC

## 2011-01-05 MED ORDER — FUROSEMIDE 40 MG PO TABS: 40.0000 mg | ORAL_TABLET | Freq: Every day | ORAL | Status: DC

## 2011-01-05 MED ORDER — ALBUTEROL SULFATE HFA 108 (90 BASE) MCG/ACT IN AERS: 2.0000 | INHALATION_SPRAY | Freq: Four times a day (QID) | RESPIRATORY_TRACT | Status: DC | PRN

## 2011-01-05 MED ORDER — ENALAPRIL MALEATE 10 MG PO TABS: 10.0000 mg | ORAL_TABLET | Freq: Every day | ORAL | Status: DC

## 2011-01-05 MED ORDER — AMIODARONE HCL 400 MG PO TABS: 400.0000 mg | ORAL_TABLET | Freq: Two times a day (BID) | ORAL | Status: DC

## 2011-01-05 MED ORDER — CEPHALEXIN 500 MG PO CAPS: 500.0000 mg | ORAL_CAPSULE | Freq: Three times a day (TID) | ORAL | Status: DC

## 2011-01-05 MED ORDER — ENALAPRIL MALEATE 10 MG PO TABS
10.0000 mg | ORAL_TABLET | Freq: Every day | ORAL | Status: DC
  Administered 2011-01-05 – 2011-01-06 (×2): 10 mg via ORAL
  Filled 2011-01-05 (×3): qty 1

## 2011-01-05 MED ORDER — CEPHALEXIN 500 MG PO CAPS
500.0000 mg | ORAL_CAPSULE | Freq: Three times a day (TID) | ORAL | Status: DC
  Administered 2011-01-05 – 2011-01-06 (×4): 500 mg via ORAL
  Filled 2011-01-05 (×7): qty 1

## 2011-01-05 NOTE — Progress Notes (Addendum)
6 Days Post-Op Procedure(s) (LRB): CORONARY ARTERY BYPASS GRAFTING (CABG) (N/A)  Subjective: Patient had a bowel movement. Feeling better.  Objective: Vital signs in last 24 hours: Patient Vitals for the past 24 hrs:  BP Temp Temp src Pulse Resp SpO2 Weight  01/02/11 0455 133/72 mmHg 99.6 F (37.6 C) Oral 105  20  90 % 219 lb 12.8 oz (99.7 kg)  01/02/11 0207 - - - - - 91 % -  01/01/11 2210 136/80 mmHg 98 F (36.7 C) Oral 111  20  91 % -  01/01/11 2118 135/79 mmHg - - 132  - - -  01/01/11 1951 - - - - - 91 % -  01/01/11 1537 - - - - - 93 % -  01/01/11 1300 149/85 mmHg 98.9 F (37.2 C) Oral 114  20  92 % -   Pre op weight  97.5 kg Current Weight  01/05/11 210 lb 1.6 oz (95.3 kg)      Intake/Output from previous day: 12/15 0701 - 12/16 0700 In: 480 [P.O.:480] Out: 775 [Urine:775]   Physical Exam:  Cardiovascular: RRR, no murmurs, gallops, or rubs. Pulmonary: Slightly decreased at bases bilaterally; no rales, wheezes, or rhonchi. Abdomen: Soft, non tender, some distention,bowel sounds present. Extremities:  Bilateral lower extremity edema L>R;LUE with erythema and warmth (secondary to infiltrated iv-amiodarone) Wounds: Clean and dry.  No erythema or signs of infection.  Lab Results: CBC:  Basename 01/03/11 0540  WBC 10.5  HGB 13.5  HCT 40.4  PLT 185   BMET:  No results found for this basename: NA:2,K:2,CL:2,CO2:2,GLUCOSE:2,BUN:2,CREATININE:2,CALCIUM:2 in the last 72 hours  PT/INR: No results found for this basename: LABPROT,INR in the last 72 hours ABG:  INR: Will add last result for INR, ABG once components are confirmed Will add last 4 CBG results once components are confirmed  Assessment/Plan:  1. CV - Previous afib/flutter with RVR. HR increased briefly into 160's yesterday. SR this am. Continue Lopressor 25 bid, Amiodarone 400 bid.Will increase Enalapril to 10 for better SBP control. 2.  Pulmonary - Encourage incentive spirometer.  3. Volume Overload -  Continue diuresis. 4.DM-CBGs 120/187/171  . Continue Metformin 500 bid. Will stop scheduled Insulin . HGA1C 6.7 pre op. 5.Continue CRPI 6.Mucinex for cough. 7.Await BMET results. 8.Keflex for phlebitis. 9.Possible discharge later today vs am.   Ardelle Balls, PA-C 01/05/2011    01/05/2011 7:38 AM    Chart reviewed, patient examined, agree with above.  He should be able to go in am.

## 2011-01-05 NOTE — Progress Notes (Signed)
EPW and CTS discontinued per protocol. Tips intact. Patient tolerated well.  Patient advised Bedrest X 1 hour. Keith Flores, [name]

## 2011-01-06 LAB — GLUCOSE, CAPILLARY
Glucose-Capillary: 139 mg/dL — ABNORMAL HIGH (ref 70–99)
Glucose-Capillary: 194 mg/dL — ABNORMAL HIGH (ref 70–99)

## 2011-01-06 NOTE — Progress Notes (Signed)
UR Completed.  Keith Flores Jane. [phone]. 01/06/2011  

## 2011-01-06 NOTE — Progress Notes (Signed)
   CARE MANAGEMENT NOTE 01/06/2011  Patient:  Keith Flores,Keith Flores   Account Number:  [id number]  Date Initiated:  12/31/2010  Documentation initiated by:  Avie Arenas  Subjective/Objective Assessment:   Post op CABG x4on 12-30-10     Action/Plan:   PTA, PT INDEPENDENT, LIVES WITH SPOUSE.  PT STATES FAMILY IS CURRENTLY ON VACATION IN [place] AND WILL NOT RETURN FOR SEVERAL WEEKS.   Anticipated DC Date:  01/06/2011   Anticipated DC Plan:  SKILLED NURSING FACILITY  In-house referral  Clinical Social Worker      DC Planning Services  CM consult  Medication Assistance      Choice offered to / List presented to:             Status of service:  Completed, signed off Medicare Important Message given?   (If response is "NO", the following Medicare IM given date fields will be blank) Date Medicare IM given:   Date Additional Medicare IM given:    Discharge Disposition:  HOME/SELF CARE  Per UR Regulation:  Reviewed for med. necessity/level of care/duration of stay  Comments:  01/06/11 Casee Knepp,RN,BSN 1100 PT CONCERNED ABOUT PAYING FOR HIS MEDICATIONS, AS HE HAS NO INSURANCE.  MET WITH PT TO DISCUSS CONCERNS.  PT IS ON ESSENTIALLY ALL $4 GENERIC MEDICATIONS, EXCEPT FOR AMIODARONE.  PT GIVEN RX OUTREACH APPLICATION FROM [facility] FOR ASSISTANCE WITH AMIODARONE.  IF ELIGIBLE, PT CAN RECEIVE MED FOR $30/90 DAY  SUPPLY.  EXPLAINED PROCESS TO PT, AND HE VERBALIZES UNDERSTANDING.  PT TO DC HOME WITH HIS FRIEND TODAY; WILL NOT NEED SNF PLACEMENT AS EARLIER THOUGHT. Phone #[phone]  01-06-11 9am Avie Arenas, RNBSN - [phone] UR Completed.  01/02/11 Lameisha Schuenemann,RN,BSN 1215 MET WITH PT TO DISCUSS DISCHARGE PLANS.  PT STATES HE HAS NO CAREGIVER AVAILABLE AT DISCHARGE.  WILL NEED SHORT TERM SNF FOR REHAB, IF POSSIBLE.  PT IS A CITIZEN, HAS APPLIED FOR MEDICAID.  WILL CONSULT CSW TO FACILITATE DISCHARGE TO SNF WHEN MEDICALLY STABLE.  12-31-10 11:20am Avie Arenas, RNBSN [phone] UR Completed.

## 2011-01-06 NOTE — Progress Notes (Addendum)
[address]            [place] [address]          [phone]     7 Days Post-Op  Procedure(s) (LRB): CORONARY ARTERY BYPASS GRAFTING (CABG) (N/A) Subjective: Feeling stronger , mild sore throat  Objective  Telemetry NSR, Stachy  Temp:  [98.4 F (36.9 C)-98.9 F (37.2 C)] 98.9 F (37.2 C) (12/17 0654) Pulse Rate:  [90-98] 98  (12/16 2150) Resp:  [20-22] 20  (12/17 0654) BP: (110-135)/(72-82) 134/82 mmHg (12/17 0654) SpO2:  [90 %-95 %] 90 % (12/17 0654) Weight:  [208 lb 4.8 oz (94.484 kg)] 208 lb 4.8 oz (94.484 kg) (12/17 0654)   Intake/Output Summary (Last 24 hours) at 01/06/11 9147 Last data filed at 01/06/11 0700  Gross per 24 hour  Intake    720 ml  Output    750 ml  Net    -30 ml       General appearance: alert, cooperative and no distress Heart: regular rate and rhythm, S1, S2 normal, no murmur, click, rub or gallop Lungs: mildly diminished in the bases Abdomen: soft, non-tender; bowel sounds normal; no masses,  no organomegaly Extremities: minor lower extremity edema Wound: incisions healing well without signs of infection  Lab Results:  Basename 01/05/11 0500  NA 131*  K 4.4  CL 95*  CO2 22  GLUCOSE 202*  BUN 22  CREATININE 1.03  CALCIUM 9.6  MG 2.3  PHOS --   No results found for this basename: AST:2,ALT:2,ALKPHOS:2,BILITOT:2,PROT:2,ALBUMIN:2 in the last 72 hours No results found for this basename: LIPASE:2,AMYLASE:2 in the last 72 hours No results found for this basename: WBC:2,NEUTROABS:2,HGB:2,HCT:2,MCV:2,PLT:2 in the last 72 hours No results found for this basename: CKTOTAL:4,CKMB:4,TROPONINI:4 in the last 72 hours No components found with this basename: POCBNP:3 No results found for this basename: DDIMER in the last 72 hours No results found for this basename: HGBA1C in the last 72 hours No results found for this basename: CHOL,HDL,LDLCALC,TRIG,CHOLHDL in the last 72 hours No results found for this  basename: TSH,T4TOTAL,FREET3,T3FREE,THYROIDAB in the last 72 hours No results found for this basename: VITAMINB12,FOLATE,FERRITIN,TIBC,IRON,RETICCTPCT in the last 72 hours  Medications: Scheduled    . amiodarone  400 mg Oral Q12H   Followed by  . amiodarone  400 mg Oral Daily  . aspirin EC  325 mg Oral Daily  . cephALEXin  500 mg Oral Q8H  . docusate sodium  200 mg Oral Daily  . enalapril  10 mg Oral Daily  . enoxaparin  40 mg Subcutaneous Q24H  . guaiFENesin  600 mg Oral BID  . insulin aspart  0-24 Units Subcutaneous TID AC & HS  . metFORMIN  500 mg Oral BID WC  . metoprolol tartrate  25 mg Oral BID  . pantoprazole  40 mg Oral QAC breakfast  . povidone-iodine  1 application Topical BID  . rosuvastatin  20 mg Oral q1800  . sodium chloride  3 mL Intravenous Q12H     Radiology/Studies:  No results found.  INR: Will add last result for INR, ABG once components are confirmed Will add last 4 CBG results once components are confirmed  Assessment/Plan: S/P Procedure(s) (LRB): CORONARY ARTERY BYPASS GRAFTING (CABG) (N/A)  1. Rhythm stable, tachy at times, but no further afib. 2. BP well controlled 3. CBG 105-171 range for 24 hours 4. L arm feeling better, erethema  and induration improved 5. Stable for d/c home  LOS: 7 days    GOLD,WAYNE E 12/17/20128:12 AM   Patient feels strong today, Plan dc home with friends helping him. I have seen and examined [name] and agree with the above assessment  and plan.  Delight Ovens MD Beeper [phone] Office [phone] 01/06/2011 11:46 AM

## 2011-01-16 ENCOUNTER — Ambulatory Visit (INDEPENDENT_AMBULATORY_CARE_PROVIDER_SITE_OTHER): Payer: Medicaid Other | Admitting: Cardiovascular Disease

## 2011-01-16 ENCOUNTER — Encounter: Payer: Self-pay | Admitting: Cardiovascular Disease

## 2011-01-16 DIAGNOSIS — E782 Mixed hyperlipidemia: Secondary | ICD-10-CM | POA: Insufficient documentation

## 2011-01-16 DIAGNOSIS — R0602 Shortness of breath: Secondary | ICD-10-CM

## 2011-01-16 DIAGNOSIS — I4891 Unspecified atrial fibrillation: Secondary | ICD-10-CM

## 2011-01-16 DIAGNOSIS — I2510 Atherosclerotic heart disease of native coronary artery without angina pectoris: Secondary | ICD-10-CM | POA: Insufficient documentation

## 2011-01-16 DIAGNOSIS — I10 Essential (primary) hypertension: Secondary | ICD-10-CM | POA: Insufficient documentation

## 2011-01-16 MED ORDER — METFORMIN HCL 1000 MG PO TABS: 1000.0000 mg | ORAL_TABLET | Freq: Every day | ORAL | Status: DC

## 2011-01-16 MED ORDER — AMIODARONE HCL 200 MG PO TABS: 200.0000 mg | ORAL_TABLET | Freq: Every day | ORAL | Status: DC

## 2011-01-16 MED ORDER — ENALAPRIL MALEATE 10 MG PO TABS: 10.0000 mg | ORAL_TABLET | Freq: Two times a day (BID) | ORAL | Status: DC

## 2011-01-16 MED ORDER — METOPROLOL TARTRATE 50 MG PO TABS
50.0000 mg | ORAL_TABLET | Freq: Two times a day (BID) | ORAL | Status: DC
Start: 2011-01-16 — End: 2011-02-20

## 2011-01-16 NOTE — Assessment & Plan Note (Signed)
The patient had postoperative atrial fibrillation and is maintaining sinus rhythm on oral amiodarone. I asked him to reduce his dose to 200 mg daily. I will see him back in 4 weeks and we'll discontinue amiodarone if he remains in sinus.

## 2011-01-16 NOTE — Progress Notes (Signed)
HPI:  This is a 50 year old gentleman presenting for followup evaluation.  The patient was admitted to the hospital December 21, 2010 with symptoms of unstable angina. He had recently undergone cardiac catheterization in [place] in October 2012. He brought his catheterization films on CD to the hospital.  This demonstrated diffuse multivessel coronary artery disease and the patient was referred for coronary bypass surgery. He ultimately underwent 4 vessel CABG by Dr. Tyrone Sage with the LIMA to LAD, vein graft to diagonal, vein graft to obtuse marginal, and vein graft to right PDA.  His postoperative course was complicated by atrial fibrillation, but otherwise he had no problems. He converted to sinus rhythm on amiodarone and was discharged on oral amiodarone.  He presents today for hospital followup evaluation with an interpreter. The patient is complaining of pleuritic left-sided chest pain. He has some shortness of breath as well. He denies orthopnea or PND. His edema is improving. He denies exertional chest pressure or pain. He denies lightheadedness, palpitations, or syncope. He has completed courses of furosemide and potassium as well as Keflex and he remains on his other medications from hospital discharge.  Outpatient Encounter Prescriptions as of 01/16/2011  Medication Sig Dispense Refill  . amiodarone (PACERONE) 200 MG tablet Take 1 tablet (200 mg total) by mouth daily.  30 tablet  2  . enalapril (VASOTEC) 10 MG tablet Take 1 tablet (10 mg total) by mouth 2 (two) times daily.  30 tablet  2  . guaiFENesin (MUCINEX) 600 MG 12 hr tablet Take 1 tablet (600 mg total) by mouth every 12 (twelve) hours as needed for congestion.      . metFORMIN (GLUCOPHAGE) 1000 MG tablet Take 1 tablet (1,000 mg total) by mouth daily. One time daily after lunch  30 tablet  2  . metoprolol tartrate (LOPRESSOR) 50 MG tablet Take 1 tablet (50 mg total) by mouth 2 (two) times daily.  60 tablet  2  . OXYCODONE HCL PO Take by  mouth as needed.        . rosuvastatin (CRESTOR) 40 MG tablet Take 1 tablet (40 mg total) by mouth at bedtime.  30 tablet  3  . DISCONTD: amiodarone (PACERONE) 400 MG tablet Take 1 tablet (400 mg total) by mouth 2 (two) times daily. For  5 days; then take 1/2 tablet ( Amiodarone 200 mg total)  po two times daily therafter.  60 tablet  1  . DISCONTD: enalapril (VASOTEC) 10 MG tablet Take 1 tablet (10 mg total) by mouth daily before breakfast.  30 tablet  1  . DISCONTD: furosemide (LASIX) 40 MG tablet Take 1 tablet (40 mg total) by mouth daily. For 4 days then stop.  4 tablet  0  . DISCONTD: metFORMIN (GLUCOPHAGE) 1000 MG tablet Take 1,000 mg by mouth daily. One time daily after lunch       . DISCONTD: metoprolol tartrate (LOPRESSOR) 25 MG tablet Take 1 tablet (25 mg total) by mouth 2 (two) times daily.  60 tablet  1  . DISCONTD: potassium chloride SA (K-DUR,KLOR-CON) 20 MEQ tablet Take 1 tablet (20 mEq total) by mouth daily. For 4 days then stop.  4 tablet  0  . docusate sodium (COLACE) 100 MG capsule Take 1 capsule (100 mg total) by mouth 2 (two) times daily.      Marland Kitchen DISCONTD: cephALEXin (KEFLEX) 500 MG capsule Take 1 capsule (500 mg total) by mouth every 8 (eight) hours.  13 capsule  0   Facility-Administered Encounter Medications as of  01/16/2011  Medication Dose Route Frequency Provider Last Rate Last Dose  . DISCONTD: metoprolol tartrate (LOPRESSOR) tablet 12.5 mg  12.5 mg Oral Once Delight Ovens, MD        No Known Allergies  Past Medical History  Diagnosis Date  . Hypertension   . Diabetes mellitus   . Asthma   . Hyperlipidemia   . Coronary artery disease   . Angina     ROS: Negative except as per HPI  BP 150/82  Pulse 73  Ht 5\' 7"  (1.702 m)  Wt 95.437 kg (210 lb 6.4 oz)  BMI 32.95 kg/m2  PHYSICAL EXAM: Pt is alert and oriented, overweight male in NAD HEENT: normal Neck: JVP - normal, carotids 2+= without bruits Lungs: CTA bilaterally CV: RRR without murmur or  gallop Abd: soft, NT, Positive BS, no hepatomegaly Ext: mild pretibial edema bilaterally left greater than right, distal pulses intact and equal Skin: surgical incisions are healing well  EKG:  Sinus rhythm 74 beats per minute, prolonged QT (QTC 490 ms), otherwise within normal limit  ASSESSMENT AND PLAN:

## 2011-01-16 NOTE — Patient Instructions (Signed)
Your physician has recommended you make the following change in your medication:  DECREASE Amiodarone to 200mg  take one by mouth daily INCREASE Enalapril to 10mg  take one by mouth twice a day INCREASE Metoprolol Tartrate to 50mg  take one by mouth twice a day START Colace 100mg  take one by mouth twice a day as needed for constipation (this medication is over the counter) Please make sure that you are not taking Furosemide, Keflex and Potassium   Your physician recommends that you return for lab work in 2 WEEKS--BMP and HgbA1C, lab hours are 8:30-1:30 and 2:30-4:30  Your physician recommends that you schedule a follow-up appointment in: 1 MONTH  Please go to the [place] office across from [facility] on [address] for a CHEST X-RAY.  You will go to the basement to have this test performed.  You can walk-in between 8-4:30 for this test.

## 2011-01-16 NOTE — Assessment & Plan Note (Signed)
Blood pressure is elevated. I recommended the patient increase his enalapril to 10 mg twice daily and increase his metoprolol to 50 mg twice daily. Will followup in one month.

## 2011-01-16 NOTE — Assessment & Plan Note (Addendum)
The patient is stable following coronary bypass surgery. We'll check a chest x-ray to make sure that he does not have a postop effusion in the setting of his shortness of breath and pleuritic chest pain. I think the likelihood of this is low based on his physical exam. He complains of constipation and we will advise him to take Colace 100 mg twice daily.  We need to review the patient's medicines with him and make sure that he is on a daily aspirin.  The patient has several challenges to ongoing medication adherence. These include language barrier and limited resources. He has not been able to check his blood glucoses because he does not have a glucometer. I'll check a hemoglobin A1c in about 2 weeks when he returns back for lab work and will keep him on metformin 1000 mg in the meantime. I'll treat his diabetes until he establishes with a primary care physician. He will probably need to establish at [facility] and we'll refer him there when he comes back for his followup office visit.

## 2011-01-17 ENCOUNTER — Telehealth: Payer: Self-pay

## 2011-01-17 NOTE — Telephone Encounter (Signed)
I spoke with the pt and made him aware that Dr Excell Seltzer would like him to take Aspirin 325mg  daily.  Pt verbalized understanding.

## 2011-01-20 ENCOUNTER — Ambulatory Visit (INDEPENDENT_AMBULATORY_CARE_PROVIDER_SITE_OTHER)
Admission: RE | Admit: 2011-01-20 | Discharge: 2011-01-20 | Disposition: A | Discharge status: Home / Self Care | Payer: Medicaid Other | Source: Ambulatory Visit | Attending: Cardiovascular Disease | Admitting: Cardiovascular Disease

## 2011-01-20 DIAGNOSIS — R0602 Shortness of breath: Secondary | ICD-10-CM

## 2011-01-29 ENCOUNTER — Other Ambulatory Visit: Payer: Self-pay | Admitting: Cardiothoracic Surgery

## 2011-01-29 DIAGNOSIS — I2510 Atherosclerotic heart disease of native coronary artery without angina pectoris: Secondary | ICD-10-CM

## 2011-01-30 ENCOUNTER — Other Ambulatory Visit (INDEPENDENT_AMBULATORY_CARE_PROVIDER_SITE_OTHER): Payer: Self-pay | Admitting: *Deleted

## 2011-01-30 ENCOUNTER — Ambulatory Visit: Payer: Self-pay | Admitting: Cardiothoracic Surgery

## 2011-01-30 DIAGNOSIS — I10 Essential (primary) hypertension: Secondary | ICD-10-CM

## 2011-01-30 DIAGNOSIS — I2510 Atherosclerotic heart disease of native coronary artery without angina pectoris: Secondary | ICD-10-CM

## 2011-01-30 LAB — BASIC METABOLIC PANEL
CO2: 26 mEq/L (ref 19–32)
Calcium: 9.3 mg/dL (ref 8.4–10.5)
Chloride: 100 mEq/L (ref 96–112)
Glucose, Bld: 150 mg/dL — ABNORMAL HIGH (ref 70–99)
Sodium: 136 mEq/L (ref 135–145)

## 2011-02-03 ENCOUNTER — Encounter: Payer: Self-pay | Admitting: Cardiothoracic Surgery

## 2011-02-03 ENCOUNTER — Ambulatory Visit
Admission: RE | Admit: 2011-02-03 | Discharge: 2011-02-03 | Disposition: A | Discharge status: Home / Self Care | Payer: No Typology Code available for payment source | Source: Ambulatory Visit | Attending: Cardiothoracic Surgery | Admitting: Cardiothoracic Surgery

## 2011-02-03 ENCOUNTER — Ambulatory Visit (INDEPENDENT_AMBULATORY_CARE_PROVIDER_SITE_OTHER): Payer: Self-pay | Admitting: Cardiothoracic Surgery

## 2011-02-03 VITALS — BP 181/103 | HR 75 | Resp 16 | Ht 67.0 in | Wt 209.0 lb

## 2011-02-03 DIAGNOSIS — Z09 Encounter for follow-up examination after completed treatment for conditions other than malignant neoplasm: Secondary | ICD-10-CM

## 2011-02-03 DIAGNOSIS — Z951 Presence of aortocoronary bypass graft: Secondary | ICD-10-CM

## 2011-02-03 DIAGNOSIS — I2510 Atherosclerotic heart disease of native coronary artery without angina pectoris: Secondary | ICD-10-CM

## 2011-02-03 NOTE — Patient Instructions (Addendum)
Take BP medications, Very Important Follow up with cardiology for checking BP No lifting over  25  Lbs for 3 months Need Primary Care MD to follow Diabetes and BP

## 2011-02-03 NOTE — Progress Notes (Signed)
[address]            [place] [address]          [phone]       [facility] Medical Record #[id number] Date of Birth: 1960/05/29  Micheline Chapman, MD No primary provider on file.  Chief Complaint:   PostOp Follow Up Visit SURGICAL PROCEDURE: Coronary artery bypass grafting x4 with the left  internal mammary to the left anterior descending coronary artery,  reversed saphenous vein graft to the diagonal coronary artery, reversed  saphenous vein graft to the distal obtuse marginal coronary artery,  reversed saphenous vein graft to the posterior descending coronary  artery with the left leg, thigh and calf endo vein harvesting.    History of Present Illness:     Patient is doing well postoperatively. He's had some paresthesias of the left chest associated with use of the mammary artery. He's had no recurrent angina or evidence of congestive heart failure. His blood pressure is elevated. On his last visit to cardiology his doses of ACE and beta blocker were increased. In discussing this with the patient even with the interpreter I'm not sure he was taking his medication properly.   History  Smoking status  . Former Smoker  . Quit date: 10/21/2010  Smokeless tobacco  . Never Used       No Known Allergies  Current Outpatient Prescriptions  Medication Sig Dispense Refill  . aspirin 325 MG tablet Take 1 tablet (325 mg total) by mouth daily.      Marland Kitchen docusate sodium (COLACE) 100 MG capsule Take 1 capsule (100 mg total) by mouth 2 (two) times daily.      . enalapril (VASOTEC) 10 MG tablet Take 1 tablet (10 mg total) by mouth 2 (two) times daily.  30 tablet  2  . metFORMIN (GLUCOPHAGE) 1000 MG tablet Take 1 tablet (1,000 mg total) by mouth daily. One time daily after lunch  30 tablet  2  . metoprolol tartrate (LOPRESSOR) 50 MG tablet Take 1 tablet (50 mg total) by mouth 2 (two) times daily.  60 tablet  2  . rosuvastatin  (CRESTOR) 40 MG tablet Take 1 tablet (40 mg total) by mouth at bedtime.  30 tablet  3  . guaiFENesin (MUCINEX) 600 MG 12 hr tablet Take 1 tablet (600 mg total) by mouth every 12 (twelve) hours as needed for congestion.      . OXYCODONE HCL PO Take by mouth as needed.             Physical Exam: BP 181/103  Pulse 75  Resp 16  Ht 5\' 7"  (1.702 m)  Wt 209 lb (94.802 kg)  BMI 32.73 kg/m2  SpO2 95%  General appearance: alert, cooperative and appears stated age Neurologic: intact Heart: regular rate and rhythm, S1, S2 normal, no murmur, click, rub or gallop Lungs: clear to auscultation bilaterally Abdomen: soft, non-tender; bowel sounds normal; no masses,  no organomegaly Extremities: extremities normal, atraumatic, no cyanosis or edema, Homans sign is negative, no sign of DVT, no edema, redness or tenderness in the calves or thighs and no ulcers, gangrene or trophic changes Wound: sternum stable well healed   Diagnostic Studies & Laboratory data:         Recent Radiology Findings: Dg Chest 2 View  02/03/2011  *RADIOLOGY REPORT*  Clinical Data: Recent CABG.  Follow-up.  CHEST - 2 VIEW  Comparison: 01/20/2011.  Findings: Trachea is midline.  Heart is at the upper limits of normal in size, stable.  Small left pleural effusion and left basilar volume loss are stable.  Right lung is clear.  No pneumothorax.  IMPRESSION: Small left pleural effusion and mild left basilar volume loss are stable.  Original Report Authenticated By: Reyes Ivan, M.D.      Recent Labs: Lab Results  Component Value Date   WBC 10.5 01/03/2011   HGB 13.5 01/03/2011   HCT 40.4 01/03/2011   PLT 185 01/03/2011   GLUCOSE 150* 01/30/2011   CHOL 168 12/23/2010   TRIG 257* 12/23/2010   HDL 29* 12/23/2010   LDLCALC 88 12/23/2010   ALT 89* 12/27/2010   AST 31 12/27/2010   NA 136 01/30/2011   K 3.9 01/30/2011   CL 100 01/30/2011   CREATININE 1.1 01/30/2011   BUN 16 01/30/2011   CO2 26 01/30/2011   INR 1.29 12/30/2010     HGBA1C 5.9 01/30/2011      Assessment / Plan:    Doing well post op BP is high today patient instructed on need for taking BP meds Follow up with cardiology for checking BP No lifting over  25  Lbs for 3 months Need Primary Care MD to follow Diabetes and BP       Delight Ovens MD 02/03/2011 5:31 PM

## 2011-02-14 ENCOUNTER — Ambulatory Visit: Payer: Self-pay | Admitting: Cardiovascular Disease

## 2011-02-17 ENCOUNTER — Encounter (HOSPITAL_COMMUNITY): Payer: Self-pay | Admitting: *Deleted

## 2011-02-17 ENCOUNTER — Inpatient Hospital Stay (HOSPITAL_COMMUNITY)
Admission: EM | Admit: 2011-02-17 | Discharge: 2011-02-20 | DRG: 305 | Disposition: A | Discharge status: Home / Self Care | Payer: Medicaid Other | Attending: Cardiovascular Disease | Admitting: Cardiovascular Disease

## 2011-02-17 DIAGNOSIS — I2510 Atherosclerotic heart disease of native coronary artery without angina pectoris: Secondary | ICD-10-CM | POA: Insufficient documentation

## 2011-02-17 DIAGNOSIS — Z79899 Other long term (current) drug therapy: Secondary | ICD-10-CM

## 2011-02-17 DIAGNOSIS — Z7982 Long term (current) use of aspirin: Secondary | ICD-10-CM

## 2011-02-17 DIAGNOSIS — Z951 Presence of aortocoronary bypass graft: Secondary | ICD-10-CM

## 2011-02-17 DIAGNOSIS — R079 Chest pain, unspecified: Secondary | ICD-10-CM

## 2011-02-17 DIAGNOSIS — I169 Hypertensive crisis, unspecified: Secondary | ICD-10-CM

## 2011-02-17 DIAGNOSIS — I10 Essential (primary) hypertension: Principal | ICD-10-CM | POA: Diagnosis present

## 2011-02-17 DIAGNOSIS — R0602 Shortness of breath: Secondary | ICD-10-CM

## 2011-02-17 DIAGNOSIS — E782 Mixed hyperlipidemia: Secondary | ICD-10-CM | POA: Insufficient documentation

## 2011-02-17 DIAGNOSIS — I160 Hypertensive urgency: Secondary | ICD-10-CM

## 2011-02-17 DIAGNOSIS — R0789 Other chest pain: Secondary | ICD-10-CM | POA: Diagnosis present

## 2011-02-17 DIAGNOSIS — E785 Hyperlipidemia, unspecified: Secondary | ICD-10-CM | POA: Diagnosis present

## 2011-02-17 DIAGNOSIS — E119 Type 2 diabetes mellitus without complications: Secondary | ICD-10-CM | POA: Diagnosis present

## 2011-02-17 MED ORDER — LABETALOL HCL 5 MG/ML IV SOLN
20.0000 mg | Freq: Once | INTRAVENOUS | Status: AC
  Administered 2011-02-18: 20 mg via INTRAVENOUS
  Filled 2011-02-17: qty 4

## 2011-02-17 NOTE — ED Notes (Signed)
Pt. C/o intermittent CP, heaviness, denies n/v.

## 2011-02-17 NOTE — ED Provider Notes (Signed)
History     CSN: [id number]  Arrival date & time 02/17/11  2315   First MD Initiated Contact with Patient 02/17/11 2332      Chief Complaint  Patient presents with  . Chest Pain    (Consider location/radiation/quality/duration/timing/severity/associated sxs/prior treatment) HPI  50yoM h/o two-vessel coronary artery disease status post four-vessel CABG in December 2012, hypertension, diabetes, hyperlipidemia presents with chest pressure. The patient states he had left-sided chest pressure which he describes as an elephant sitting on his chest yesterday while at rest. He states that this would only after several minutes. He states it returned today approximately 2 hours prior to arrival and is now resolved. Denies radiation of the pain to his back but states that he has had some mild back discomfort since his surgery. There is no arm pain. He denies nausea, vomiting, diaphoresis. He does state that this feels like his angina in the past. He did not take anything prior to arrival except for Aleve yesterday which did nothing for the pain. He also states that his "head hurts in the front and ". He states that he normally has a headache like this when his sugar is higher when his blood pressure is high. He denies numbness, tingling, weakness of extremities. As fevers, chills, cough. There is no neck stiffness. He states he's been compliant with his metformin, enalapril, metoprolol. He states that his blood pressures usually systolic 180s over diastolic 110s.  Cardiology- Dr. Excell Seltzer  ED Notes, ED Provider Notes from 02/17/11 0000 to 02/17/11 23:22:49       Wynona Canes Chrisco, RN 02/17/2011 23:22      The pt has had some lt sided chest pain since yesterday/ He just had a cabg last month. He currently has chest pain. His son is at bedside i interpreting for him    Past Medical History  Diagnosis Date  . Hypertension   . Diabetes mellitus   . Asthma   . Hyperlipidemia   . Coronary artery disease     . Angina     Past Surgical History  Procedure Date  . Cardiac catheterization   . Coronary artery bypass graft 12/30/2010    Procedure: CORONARY ARTERY BYPASS GRAFTING (CABG);  Surgeon: Delight Ovens, MD;  Location: [facility] OR;  Service: Open Heart Surgery;  Laterality: N/A;    Family History  Problem Relation Age of Onset  . Anesthesia problems Neg Hx   . Hypotension Neg Hx   . Malignant hyperthermia Neg Hx   . Pseudochol deficiency Neg Hx     History  Substance Use Topics  . Smoking status: Former Smoker    Quit date: 10/21/2010  . Smokeless tobacco: Never Used  . Alcohol Use: No      Review of Systems  All other systems reviewed and are negative.   except as noted HPI   Allergies  Review of patient's allergies indicates no known allergies.  Home Medications   Current Outpatient Rx  Name Route Sig Dispense Refill  . ASPIRIN 325 MG PO TABS Oral Take 1 tablet (325 mg total) by mouth daily.    . ENALAPRIL MALEATE 10 MG PO TABS Oral Take 1 tablet (10 mg total) by mouth 2 (two) times daily. 30 tablet 2  . METFORMIN HCL 1000 MG PO TABS Oral Take 1 tablet (1,000 mg total) by mouth daily. One time daily after lunch 30 tablet 2  . METOPROLOL TARTRATE 50 MG PO TABS Oral Take 1 tablet (50 mg total) by mouth  2 (two) times daily. 60 tablet 2    BP 180/109  Pulse 87  Temp(Src) 98.2 F (36.8 C) (Oral)  Resp 23  SpO2 97%  Physical Exam  Nursing note and vitals reviewed. Constitutional: He is oriented to person, place, and time. He appears well-developed and well-nourished. No distress.  HENT:  Head: Atraumatic.  Mouth/Throat: Oropharynx is clear and moist.  Eyes: Conjunctivae are normal. Pupils are equal, round, and reactive to light.  Neck: Neck supple.  Cardiovascular: Normal rate, regular rhythm, normal heart sounds and intact distal pulses.  Exam reveals no gallop and no friction rub.   No murmur heard. Pulmonary/Chest: Effort normal. No respiratory distress.  He has no wheezes. He has no rales. He exhibits tenderness.       +lt cw ttp-- does not reproduce pressure   Abdominal: Soft. Bowel sounds are normal. There is no tenderness. There is no rebound and no guarding.  Musculoskeletal: Normal range of motion. He exhibits no edema and no tenderness.  Neurological: He is alert and oriented to person, place, and time.  Skin: Skin is warm and dry.  Psychiatric: He has a normal mood and affect.      Date: 02/17/2011  Rate: 88  Rhythm: normal sinus rhythm  QRS Axis: normal  Intervals: normal  ST/T Wave abnormalities: nonspecific ST changes  Conduction Disutrbances:none  Narrative Interpretation:   Old EKG Reviewed: no significant change from prior   ED Course  Procedures (including critical care time)  Labs Reviewed  CBC - Abnormal; Notable for the following:    RBC 5.87 (*)    Hemoglobin 17.6 (*)    All other components within normal limits  BASIC METABOLIC PANEL - Abnormal; Notable for the following:    Glucose, Bld 134 (*)    GFR calc non Af Amer 74 (*)    GFR calc Af Amer 86 (*)    All other components within normal limits  GLUCOSE, CAPILLARY - Abnormal; Notable for the following:    Glucose-Capillary 128 (*)    All other components within normal limits  DIFFERENTIAL  CARDIAC PANEL(CRET KIN+CKTOT+MB+TROPI)   Dg Chest 2 View  02/18/2011  *RADIOLOGY REPORT*  Clinical Data: Chest pain, recent CABG  CHEST - 2 VIEW  Comparison: 02/03/2011  Findings: Enlargement of cardiac silhouette post CABG. Pulmonary vascular congestion. Mediastinal contours stable. Left basilar atelectasis. Decreased left pleural effusion since prior study. Basilar hypoinflation. No acute infiltrate, right pleural effusion or pneumothorax. Cardiac monitoring lines noted tubing project over chest.  IMPRESSION: Enlargement of cardiac silhouette post CABG. Left basilar atelectasis.  Original Report Authenticated By: Lollie Marrow, M.D.     1. Chest pain   2.  Hypertensive crisis     MDM  history of multivessel area disease status post CABG since with chest pressure. His blood pressure is quite elevated on arrival SBP >220 DBP > 120. Slightly improved with labetalol. Patient was asymptomatic on arrival now complaining of recurrent chest pressure. Nitroglycerin drip ordered. Labs reviewed and unremarkable. Chest x-ray unremarkable. I discussed the case with radiology who will see the patient in the emergency department, likely admit.        Forbes Cellar, MD 02/18/11 [phone]

## 2011-02-17 NOTE — ED Notes (Signed)
CBG was 128 

## 2011-02-17 NOTE — ED Notes (Signed)
The pt has had some lt sided chest pain since yesterday/  He just had a cabg last month.  He currently has chest pain.  His son is at bedside  i interpreting for him

## 2011-02-18 ENCOUNTER — Other Ambulatory Visit: Payer: Self-pay

## 2011-02-18 ENCOUNTER — Emergency Department (HOSPITAL_COMMUNITY): Payer: Medicaid Other

## 2011-02-18 ENCOUNTER — Encounter (HOSPITAL_COMMUNITY): Payer: Self-pay | Admitting: *Deleted

## 2011-02-18 ENCOUNTER — Inpatient Hospital Stay (HOSPITAL_COMMUNITY): Payer: Medicaid Other

## 2011-02-18 DIAGNOSIS — I160 Hypertensive urgency: Secondary | ICD-10-CM

## 2011-02-18 DIAGNOSIS — R079 Chest pain, unspecified: Secondary | ICD-10-CM

## 2011-02-18 LAB — HEMOGLOBIN A1C
Hgb A1c MFr Bld: 5.7 % — ABNORMAL HIGH (ref <5.7)
Mean Plasma Glucose: 117 mg/dL — ABNORMAL HIGH (ref <117)

## 2011-02-18 LAB — CBC
MCH: 30 pg (ref 26.0–34.0)
MCHC: 34.3 g/dL (ref 30.0–36.0)
MCV: 86 fL (ref 78.0–100.0)
Platelets: 183 10*3/uL (ref 150–400)
Platelets: 193 10*3/uL (ref 150–400)
RDW: 14.1 % (ref 11.5–15.5)
RDW: 14.1 % (ref 11.5–15.5)
WBC: 6.8 10*3/uL (ref 4.0–10.5)
WBC: 7.4 10*3/uL (ref 4.0–10.5)

## 2011-02-18 LAB — TSH: TSH: 2.95 u[IU]/mL (ref 0.350–4.500)

## 2011-02-18 LAB — DIFFERENTIAL
Basophils Absolute: 0 10*3/uL (ref 0.0–0.1)
Eosinophils Absolute: 0.4 10*3/uL (ref 0.0–0.7)
Eosinophils Relative: 5 % (ref 0–5)
Lymphocytes Relative: 34 % (ref 12–46)

## 2011-02-18 LAB — CARDIAC PANEL(CRET KIN+CKTOT+MB+TROPI)
CK, MB: 2.8 ng/mL (ref 0.3–4.0)
Relative Index: INVALID (ref 0.0–2.5)
Relative Index: INVALID (ref 0.0–2.5)
Total CK: 56 U/L (ref 7–232)
Total CK: 57 U/L (ref 7–232)
Total CK: 61 U/L (ref 7–232)
Total CK: 66 U/L (ref 7–232)
Troponin I: <0.3 ng/mL (ref <0.30)

## 2011-02-18 LAB — LIPID PANEL
HDL: 32 mg/dL — ABNORMAL LOW (ref >39)
LDL Cholesterol: 88 mg/dL (ref 0–99)

## 2011-02-18 LAB — GLUCOSE, CAPILLARY: Glucose-Capillary: 109 mg/dL — ABNORMAL HIGH (ref 70–99)

## 2011-02-18 LAB — D-DIMER, QUANTITATIVE: D-Dimer, Quant: 0.59 ug/mL-FEU — ABNORMAL HIGH (ref 0.00–0.48)

## 2011-02-18 LAB — APTT: aPTT: 37 seconds (ref 24–37)

## 2011-02-18 LAB — CREATININE, SERUM
Creatinine, Ser: 0.93 mg/dL (ref 0.50–1.35)
GFR calc Af Amer: >90 mL/min (ref >90)
GFR calc non Af Amer: >90 mL/min (ref >90)

## 2011-02-18 LAB — BASIC METABOLIC PANEL
Calcium: 9.7 mg/dL (ref 8.4–10.5)
GFR calc non Af Amer: 74 mL/min — ABNORMAL LOW (ref >90)
Sodium: 139 mEq/L (ref 135–145)

## 2011-02-18 MED ORDER — NITROGLYCERIN 0.4 MG SL SUBL
0.4000 mg | SUBLINGUAL_TABLET | SUBLINGUAL | Status: DC | PRN
  Administered 2011-02-18 (×2): 0.4 mg via SUBLINGUAL
  Filled 2011-02-18: qty 25
  Filled 2011-02-18: qty 75

## 2011-02-18 MED ORDER — ENOXAPARIN SODIUM 40 MG/0.4ML ~~LOC~~ SOLN
40.0000 mg | SUBCUTANEOUS | Status: DC
  Administered 2011-02-18 – 2011-02-20 (×3): 40 mg via SUBCUTANEOUS
  Filled 2011-02-18 (×3): qty 0.4

## 2011-02-18 MED ORDER — LABETALOL HCL 5 MG/ML IV SOLN: 20.0000 mg | Freq: Once | INTRAVENOUS | Status: DC

## 2011-02-18 MED ORDER — SODIUM CHLORIDE 0.9 % IJ SOLN
3.0000 mL | Freq: Two times a day (BID) | INTRAMUSCULAR | Status: DC
  Administered 2011-02-20: 3 mL via INTRAVENOUS

## 2011-02-18 MED ORDER — MORPHINE SULFATE 4 MG/ML IJ SOLN
4.0000 mg | Freq: Once | INTRAMUSCULAR | Status: AC
  Administered 2011-02-18: 4 mg via INTRAVENOUS
  Filled 2011-02-18: qty 1

## 2011-02-18 MED ORDER — METOPROLOL TARTRATE 50 MG PO TABS
50.0000 mg | ORAL_TABLET | Freq: Two times a day (BID) | ORAL | Status: DC
  Administered 2011-02-18 (×2): 50 mg via ORAL
  Filled 2011-02-18 (×4): qty 1

## 2011-02-18 MED ORDER — NITROGLYCERIN IN D5W 200-5 MCG/ML-% IV SOLN
2.0000 ug/min | Freq: Once | INTRAVENOUS | Status: AC
  Administered 2011-02-18: 5 ug/min via INTRAVENOUS
  Filled 2011-02-18: qty 250

## 2011-02-18 MED ORDER — HYDROCHLOROTHIAZIDE 12.5 MG PO CAPS
12.5000 mg | ORAL_CAPSULE | Freq: Every day | ORAL | Status: DC
  Administered 2011-02-18 – 2011-02-20 (×3): 12.5 mg via ORAL
  Filled 2011-02-18 (×3): qty 1

## 2011-02-18 MED ORDER — SODIUM CHLORIDE 0.9 % IJ SOLN: 3.0000 mL | INTRAMUSCULAR | Status: DC | PRN

## 2011-02-18 MED ORDER — IOHEXOL 350 MG/ML SOLN
80.0000 mL | Freq: Once | INTRAVENOUS | Status: AC | PRN
  Administered 2011-02-18: 80 mL via INTRAVENOUS

## 2011-02-18 MED ORDER — NITROGLYCERIN IN D5W 200-5 MCG/ML-% IV SOLN: 2.0000 ug/min | INTRAVENOUS | Status: DC

## 2011-02-18 MED ORDER — METFORMIN HCL 500 MG PO TABS: 1000.0000 mg | ORAL_TABLET | Freq: Every day | ORAL | Status: DC

## 2011-02-18 MED ORDER — NITROGLYCERIN IN D5W 200-5 MCG/ML-% IV SOLN
5.0000 ug/min | INTRAVENOUS | Status: DC
  Administered 2011-02-18: 5 ug/min via INTRAVENOUS
  Administered 2011-02-18: 10 ug/min via INTRAVENOUS

## 2011-02-18 MED ORDER — METFORMIN HCL 500 MG PO TABS
1000.0000 mg | ORAL_TABLET | Freq: Every day | ORAL | Status: DC
  Administered 2011-02-20: 1000 mg via ORAL
  Filled 2011-02-18 (×2): qty 2

## 2011-02-18 MED ORDER — ACETAMINOPHEN 325 MG PO TABS
650.0000 mg | ORAL_TABLET | ORAL | Status: DC | PRN
  Administered 2011-02-18 – 2011-02-20 (×9): 650 mg via ORAL
  Filled 2011-02-18 (×9): qty 2

## 2011-02-18 MED ORDER — ENALAPRIL MALEATE 20 MG PO TABS
20.0000 mg | ORAL_TABLET | Freq: Two times a day (BID) | ORAL | Status: DC
  Administered 2011-02-18 – 2011-02-20 (×5): 20 mg via ORAL
  Filled 2011-02-18 (×6): qty 1

## 2011-02-18 MED ORDER — METFORMIN HCL 500 MG PO TABS
1000.0000 mg | ORAL_TABLET | Freq: Every day | ORAL | Status: DC
  Administered 2011-02-18: 1000 mg via ORAL
  Filled 2011-02-18 (×2): qty 2

## 2011-02-18 MED ORDER — ONDANSETRON HCL 4 MG/2ML IJ SOLN: 4.0000 mg | Freq: Four times a day (QID) | INTRAMUSCULAR | Status: DC | PRN

## 2011-02-18 MED ORDER — ASPIRIN 325 MG PO TABS
325.0000 mg | ORAL_TABLET | Freq: Every day | ORAL | Status: DC
  Administered 2011-02-18 – 2011-02-20 (×3): 325 mg via ORAL
  Filled 2011-02-18 (×3): qty 1

## 2011-02-18 MED ORDER — SODIUM CHLORIDE 0.9 % IV SOLN
250.0000 mL | INTRAVENOUS | Status: DC | PRN
  Administered 2011-02-18 – 2011-02-20 (×3): 250 mL via INTRAVENOUS

## 2011-02-18 NOTE — ED Notes (Signed)
BP 190/108 prior to administration of labetalol 20mg  IV

## 2011-02-18 NOTE — Progress Notes (Signed)
Pt with chest pain this am. 7/10 radiating towards back. Pt states same pain as what brought him in to the hospital last night pt also with head ache and diaphoresis.  Nitro drip intycreased to and bp 167/96. After about 5 min pt pain 7/10 still, one nitro given SL as ordered bp 162/99 pain 2/10 and decreasing. Will monitor patient. Riddick Nuon, Randall An RN

## 2011-02-18 NOTE — Progress Notes (Signed)
Pt Troponin .35 . [facility] made aware and no new orders received. Pt resting in bed will monitor patient.Summerlyn Fickel, Randall An RN

## 2011-02-18 NOTE — H&P (Addendum)
Cardiology H&P  Primary Care Povider: No primary provider on file.  Primary Cardiologist: Dr. Excell Seltzer   HPI: [name] is a 51 y.o.male with difficult to control HTN, HLD, DM, CAD s/p recent CABG 12/12 (LIMA to LAD, SVG to Diagonal, SVG to OM1, SVG to PDA) who presented to the ER with the complaints of chest pain. Pt reports intermittent episodes of moderate to severe central chest pressure without clear radiation to the back (has some back pain he feels is not related) with episodes lasting about 30 minutes.  He reports this is similar to pain he has had in the past prior to his CABG.  He reports that he has done well since the surgery until 2 days ago.  He denies orthopnea, PND, LEE, syncope, palpitations.  He denies numbness, tingling, weakness of extremities, has had mild frontal headache for the last few days.  Denies fevers, chills, cough. There is no neck stiffness. He states he's been compliant with his metformin, enalapril, metoprolol. He states that his blood pressures usually systolic 180s over diastolic 110s. Upon arrival to the floor Pt BP > 200/120s.  He is currently chest pain free.   Past Medical History  Diagnosis Date  . Hypertension   . Diabetes mellitus   . Asthma   . Hyperlipidemia   . Coronary artery disease   . Angina     Past Surgical History  Procedure Date  . Cardiac catheterization   . Coronary artery bypass graft 12/30/2010    Procedure: CORONARY ARTERY BYPASS GRAFTING (CABG);  Surgeon: Delight Ovens, MD;  Location: [facility] OR;  Service: Open Heart Surgery;  Laterality: N/A;    Family History  Problem Relation Age of Onset  . Anesthesia problems Neg Hx   . Hypotension Neg Hx   . Malignant hyperthermia Neg Hx   . Pseudochol deficiency Neg Hx     Social History:  reports that he quit smoking about 3 months ago. He has never used smokeless tobacco. He reports that he does not drink alcohol or use illicit drugs.  Allergies: No Known Allergies  Current  Facility-Administered Medications  Medication Dose Route Frequency Provider Last Rate Last Dose  . labetalol (NORMODYNE,TRANDATE) injection 20 mg  20 mg Intravenous Once Forbes Cellar, MD   20 mg at 02/18/11 0020  . morphine 4 MG/ML injection 4 mg  4 mg Intravenous Once Forbes Cellar, MD   4 mg at 02/18/11 0139  . nitroGLYCERIN 0.2 mg/mL in dextrose 5 % infusion  2-200 mcg/min Intravenous Once Forbes Cellar, MD   5 mcg/min at 02/18/11 0137  . nitroGLYCERIN 0.2 mg/mL in dextrose 5 % infusion  5 mcg/min Intravenous Continuous Shawnie Pons, MD 1.5 mL/hr at 02/18/11 0402 5 mcg/min at 02/18/11 0402  . DISCONTD: labetalol (NORMODYNE,TRANDATE) injection 20 mg  20 mg Intravenous Once Forbes Cellar, MD       Current Outpatient Prescriptions  Medication Sig Dispense Refill  . aspirin 325 MG tablet Take 1 tablet (325 mg total) by mouth daily.      . enalapril (VASOTEC) 10 MG tablet Take 1 tablet (10 mg total) by mouth 2 (two) times daily.  30 tablet  2  . metFORMIN (GLUCOPHAGE) 1000 MG tablet Take 1 tablet (1,000 mg total) by mouth daily. One time daily after lunch  30 tablet  2  . metoprolol tartrate (LOPRESSOR) 50 MG tablet Take 1 tablet (50 mg total) by mouth 2 (two) times daily.  60 tablet  2    ROS: A  full review of systems is obtained and is negative except as noted in the HPI.  Physical Exam: Blood pressure 157/98, pulse 81, temperature 98.2 F (36.8 C), temperature source Oral, resp. rate 23, SpO2 97.00%.  GENERAL: no acute distress.  EYES: Extra ocular movements are intact. There is no lid lag. Sclera is anicteric.  ENT: Oropharynx is clear. Dentition is within normal limits.  NECK: Supple. The thyroid is not enlarged.  CHEST: sternotomy incision well healed, no overt abnormalities  HEART: Regular rate and rhythm with no m/g/r.  Normal S1/S2. No JVD LUNGS: Clear to auscultation There are no rales, rhonchi, or wheezes.  ABDOMEN: Soft, non-tender, and non-distended with normoactive  bowel sounds. There is no hepatosplenomegaly.  EXTREMITIES: No clubbing, cyanosis, or edema.  PULSES: Carotids were +2 and equal bilaterally with no bruits. Femoral pulses were +2 and equal bilaterally. DP/PT pulses were +2 and equal bilaterally.  SKIN: Warm, dry, and intact.  NEUROLOGIC: The patient was oriented to person, place, and time. No overt neurologic deficits were detected.  PSYCH: Normal judgment and insight, mood is appropriate.   Results: Results for orders placed during the hospital encounter of 02/17/11 (from the past 24 hour(s))  GLUCOSE, CAPILLARY     Status: Abnormal   Collection Time   02/17/11 11:52 PM      Component Value Range   Glucose-Capillary 128 (*) 70 - 99 (mg/dL)  CBC     Status: Abnormal   Collection Time   02/18/11 12:03 AM      Component Value Range   WBC 7.4  4.0 - 10.5 (K/uL)   RBC 5.87 (*) 4.22 - 5.81 (MIL/uL)   Hemoglobin 17.6 (*) 13.0 - 17.0 (g/dL)   HCT 30.8  65.7 - 84.6 (%)   MCV 86.0  78.0 - 100.0 (fL)   MCH 30.0  26.0 - 34.0 (pg)   MCHC 34.9  30.0 - 36.0 (g/dL)   RDW 96.2  95.2 - 84.1 (%)   Platelets 193  150 - 400 (K/uL)  DIFFERENTIAL     Status: Normal   Collection Time   02/18/11 12:03 AM      Component Value Range   Neutrophils Relative 54  43 - 77 (%)   Neutro Abs 4.0  1.7 - 7.7 (K/uL)   Lymphocytes Relative 34  12 - 46 (%)   Lymphs Abs 2.5  0.7 - 4.0 (K/uL)   Monocytes Relative 7  3 - 12 (%)   Monocytes Absolute 0.5  0.1 - 1.0 (K/uL)   Eosinophils Relative 5  0 - 5 (%)   Eosinophils Absolute 0.4  0.0 - 0.7 (K/uL)   Basophils Relative 1  0 - 1 (%)   Basophils Absolute 0.0  0.0 - 0.1 (K/uL)  BASIC METABOLIC PANEL     Status: Abnormal   Collection Time   02/18/11 12:03 AM      Component Value Range   Sodium 139  135 - 145 (mEq/L)   Potassium 3.6  3.5 - 5.1 (mEq/L)   Chloride 99  96 - 112 (mEq/L)   CO2 28  19 - 32 (mEq/L)   Glucose, Bld 134 (*) 70 - 99 (mg/dL)   BUN 17  6 - 23 (mg/dL)   Creatinine, Ser 3.24  0.50 - 1.35 (mg/dL)    Calcium 9.7  8.4 - 10.5 (mg/dL)   GFR calc non Af Amer 74 (*) >90 (mL/min)   GFR calc Af Amer 86 (*) >90 (mL/min)  CARDIAC PANEL(CRET KIN+CKTOT+MB+TROPI)  Status: Normal   Collection Time   02/18/11 12:08 AM      Component Value Range   Total CK 66  7 - 232 (U/L)   CK, MB 2.8  0.3 - 4.0 (ng/mL)   Troponin I <0.30  <0.30 (ng/mL)   Relative Index RELATIVE INDEX IS INVALID  0.0 - 2.5     EKG: NSR, NSST   CXR: Enlargement of cardiac silhouette post CABG. Left basilar atelectasis  Assessment/Plan: 50 y.o.male with difficult to control HTN, HLD, DM, CAD s/p recent CABG 12/12 (LIMA to LAD, SVG to Diagonal, SVG to OM1, SVG to PDA) who presented to the ER with the complaints of chest pain with BPs > 200/120s most consistent with hypertensive urgency without objective evidence of ischemia with unremarkable ECG and initial biomarkers negative.  --admit, telemetry, cycle cardiac markers  --NTG drip initiated in ER, wean as tolerated  --will initiate HCTZ, increase enalapril, continue metoprolol  --risk stratify with lipid panel and hgb A1C  --blood pressures equal in both arms, CXR neg for widened mediastinum and Pt CP free  --screen with urine drug screen  --If BP continues to be difficult to control, consider OSA evaluation and work up for secondary causes  --further plan pending the above work up  --heparin for DVT PPx    SELLERS, MATTHEW 02/18/2011, 4:23 AM       Patient seen; history difficult due to language barrier; complains of intermittent chest and back pain; not pleuritic; lasts approximately one hour; ? Reproduced with palpation. Enzymes negative and no definitive ECG changes; Given relation to recent surgery, will check d-dimer and if elevated, CT chest. Pain may be muskuloskeletal. BP elevated. Enalapril increased; HCTZ added; follow and increase meds as needed. Olga Millers

## 2011-02-19 ENCOUNTER — Ambulatory Visit: Payer: Self-pay | Admitting: Cardiovascular Disease

## 2011-02-19 ENCOUNTER — Other Ambulatory Visit: Payer: Self-pay

## 2011-02-19 DIAGNOSIS — R079 Chest pain, unspecified: Secondary | ICD-10-CM

## 2011-02-19 LAB — CBC
HCT: 50.7 % (ref 39.0–52.0)
MCH: 29.8 pg (ref 26.0–34.0)
MCHC: 34.9 g/dL (ref 30.0–36.0)
MCV: 85.4 fL (ref 78.0–100.0)
RDW: 14.1 % (ref 11.5–15.5)
WBC: 6.8 10*3/uL (ref 4.0–10.5)

## 2011-02-19 LAB — BASIC METABOLIC PANEL
BUN: 14 mg/dL (ref 6–23)
Chloride: 99 mEq/L (ref 96–112)
Creatinine, Ser: 0.94 mg/dL (ref 0.50–1.35)
GFR calc Af Amer: >90 mL/min (ref >90)

## 2011-02-19 LAB — CARDIAC PANEL(CRET KIN+CKTOT+MB+TROPI)
Relative Index: INVALID (ref 0.0–2.5)
Total CK: 49 U/L (ref 7–232)

## 2011-02-19 LAB — GLUCOSE, CAPILLARY
Glucose-Capillary: 117 mg/dL — ABNORMAL HIGH (ref 70–99)
Glucose-Capillary: 98 mg/dL (ref 70–99)

## 2011-02-19 MED ORDER — METOPROLOL TARTRATE 100 MG PO TABS
100.0000 mg | ORAL_TABLET | Freq: Two times a day (BID) | ORAL | Status: DC
  Administered 2011-02-19 – 2011-02-20 (×3): 100 mg via ORAL
  Filled 2011-02-19 (×5): qty 1

## 2011-02-19 NOTE — Progress Notes (Signed)
   CARE MANAGEMENT NOTE 02/19/2011  Patient:  Keith Flores,Keith Flores   Account Number:  [id number]  Date Initiated:  02/19/2011  Documentation initiated by:  GRAVES-BIGELOW,Algenis Ballin  Subjective/Objective Assessment:   Admitted difficult to control HTN, HLD, DM, CAD s/p recent CABG 12/12 (LIMA to LAD, SVG to Diagonal, SVG to OM1, SVG to PDA) who presented to the ER with the complaints of chest pain     Action/Plan:   Pt has medicaid -CM wil send card to admitting so they can place on file. CM will give pt a listing og MD's accepting new patients.   Anticipated DC Date:  02/12/2011   Anticipated DC Plan:  HOME/SELF CARE      DC Planning Services  CM consult      Choice offered to / List presented to:             Status of service:  Completed, signed off Medicare Important Message given?   (If response is "NO", the following Medicare IM given date fields will be blank) Date Medicare IM given:   Date Additional Medicare IM given:    Discharge Disposition:  HOME/SELF CARE  Per UR Regulation:  Reviewed for med. necessity/level of care/duration of stay  Comments:  02-19-11 [address] [place], [place] [phone] CM  will fax medicaid card to admitting to place on file. MD please write all generics for pt if possible at d/c.  No other needs assessed by CM at this time. Thanks

## 2011-02-19 NOTE — Progress Notes (Signed)
@   Subjective:  Episode of chest pain last PM; none this AM and no SOB    Objective:  Filed Vitals:   02/18/11 1900 02/18/11 2113 02/18/11 2211 02/19/11 0440  BP: 130/70 147/75 188/105 157/92  Pulse:  87 91 88  Temp:  97.8 F (36.6 C)  98.3 F (36.8 C)  TempSrc:  Oral  Oral  Resp:  20  19  Height:      Weight:      SpO2:  98% 96% 90%    Intake/Output from previous day:  Intake/Output Summary (Last 24 hours) at 02/19/11 0713 Last data filed at 02/18/11 2100  Gross per 24 hour  Intake 692.67 ml  Output    400 ml  Net 292.67 ml    Physical Exam: Physical exam: Well-developed well-nourished in no acute distress.  Skin is warm and dry.  HEENT is normal.  Neck is supple. No thyromegaly.  Chest is clear to auscultation with normal expansion. S/p sternotomy Cardiovascular exam is regular rate and rhythm.  Abdominal exam nontender or distended. No masses palpated. Extremities show no edema. neuro grossly intact    Lab Results: Basic Metabolic Panel:  Basename 02/19/11 0513 02/18/11 0524 02/18/11 0003  NA 137 -- 139  K 3.8 -- 3.6  CL 99 -- 99  CO2 28 -- 28  GLUCOSE 111* -- 134*  BUN 14 -- 17  CREATININE 0.94 0.93 --  CALCIUM 9.7 -- 9.7  MG -- 2.0 --  PHOS -- -- --   CBC:  Basename 02/19/11 0513 02/18/11 0524 02/18/11 0003  WBC 6.8 6.8 --  NEUTROABS -- -- 4.0  HGB 17.7* 16.7 --  HCT 50.7 48.7 --  MCV 85.4 86.5 --  PLT 199 183 --   Cardiac Enzymes:  Basename 02/18/11 1648 02/18/11 1102 02/18/11 0524  CKTOTAL 61 57 56  CKMB 3.6 3.6 3.3  CKMBINDEX -- -- --  TROPONINI 0.35* <0.30 <0.30     Assessment/Plan:  1) Chest pain - etiology unclear; ECG with no ST changes; CT shows no PE and normal aorta. Troponins were neg with most recent with minimal elevation. Will recheck. If continues to be elevated may need cath given recurrence of symptoms. If negative, favor medical therapy. History is difficult due to language barrier. 2) Hypertension - BP remains  elevated. Change lopressor to 100 mg po BID. Add norvasc later if BP remains elevated. May need renal dopplers as outpatient. 3) Hyperlipidemia - continue statin 4) Diabetes Mellitus.  Olga Millers 02/19/2011, 7:13 AM

## 2011-02-20 ENCOUNTER — Other Ambulatory Visit: Payer: Self-pay

## 2011-02-20 DIAGNOSIS — E119 Type 2 diabetes mellitus without complications: Secondary | ICD-10-CM

## 2011-02-20 LAB — GLUCOSE, CAPILLARY

## 2011-02-20 MED ORDER — AMLODIPINE BESYLATE 5 MG PO TABS: 5.0000 mg | ORAL_TABLET | Freq: Every day | ORAL | Status: DC

## 2011-02-20 MED ORDER — METOPROLOL TARTRATE 100 MG PO TABS: 100.0000 mg | ORAL_TABLET | Freq: Two times a day (BID) | ORAL | Status: DC

## 2011-02-20 MED ORDER — NITROGLYCERIN 0.4 MG SL SUBL: 0.4000 mg | SUBLINGUAL_TABLET | SUBLINGUAL | Status: DC | PRN

## 2011-02-20 MED ORDER — AMLODIPINE BESYLATE 5 MG PO TABS
5.0000 mg | ORAL_TABLET | Freq: Every day | ORAL | Status: DC
  Administered 2011-02-20: 5 mg via ORAL
  Filled 2011-02-20: qty 1

## 2011-02-20 MED ORDER — HYDROCHLOROTHIAZIDE 25 MG PO TABS: 12.5000 mg | ORAL_TABLET | Freq: Every day | ORAL | Status: DC

## 2011-02-20 NOTE — Progress Notes (Signed)
@   Subjective:  No further CP or SOB.   Objective:  Filed Vitals:   02/19/11 2059 02/19/11 2237 02/20/11 0454 02/20/11 0557  BP: 147/101 140/86  144/99  Pulse: 76 82  82  Temp: 98.1 F (36.7 C)   98.1 F (36.7 C)  TempSrc: Oral   Oral  Resp: 18   18  Height:      Weight:   205 lb 12.8 oz (93.35 kg)   SpO2: 93%   90%    Intake/Output from previous day:  Intake/Output Summary (Last 24 hours) at 02/20/11 0703 Last data filed at 02/19/11 1900  Gross per 24 hour  Intake   1080 ml  Output    400 ml  Net    680 ml    Physical Exam: Physical exam: Well-developed well-nourished in no acute distress.  Skin is warm and dry.  HEENT is normal.  Neck is supple. No thyromegaly.  Chest is clear to auscultation with normal expansion. S/p sternotomy Cardiovascular exam is regular rate and rhythm.  Abdominal exam nontender or distended. No masses palpated. Extremities show no edema. neuro grossly intact    Lab Results: Basic Metabolic Panel:  Basename 02/19/11 0513 02/18/11 0524 02/18/11 0003  NA 137 -- 139  K 3.8 -- 3.6  CL 99 -- 99  CO2 28 -- 28  GLUCOSE 111* -- 134*  BUN 14 -- 17  CREATININE 0.94 0.93 --  CALCIUM 9.7 -- 9.7  MG -- 2.0 --  PHOS -- -- --   CBC:  Basename 02/19/11 0513 02/18/11 0524 02/18/11 0003  WBC 6.8 6.8 --  NEUTROABS -- -- 4.0  HGB 17.7* 16.7 --  HCT 50.7 48.7 --  MCV 85.4 86.5 --  PLT 199 183 --   Cardiac Enzymes:  Basename 02/19/11 1134 02/18/11 1648 02/18/11 1102  CKTOTAL 49 61 57  CKMB 2.4 3.6 3.6  CKMBINDEX -- -- --  TROPONINI <0.30 0.35* <0.30     Assessment/Plan:  1) Chest pain - etiology unclear; possibly muskuloskeletal; ECG with no ST changes; CT shows no PE and normal aorta. FU troponin negative (minimal elevation of 0.35 of doubtful clinical significance as true MI should cause persistent elevation for 7-10 days). 2) Hypertension - BP remains elevated. Continue present meds; DC NTG; add norvasc 5 mg daily. Renal dopplers  as outpatient. DC today; FU Dr Excell Seltzer 2 weeks for further med titration. I have stressed compliance with patient. 3) Hyperlipidemia - continue statin 4) Diabetes Mellitus - Needs primary care physician. >30 min PA and physician time D2  Olga Millers 02/20/2011, 7:03 AM

## 2011-02-20 NOTE — Progress Notes (Signed)
Pt was smoking 1 ppd but quit back in October and has remained tobacco free since. Congratulated and encouraged pt to remain tobacco free. Discussed relapse prevention strategies. Referred to 1-800 quit now for f/u and support. Discussed oral fixation substitutes, second hand smoke and in home smoking policy. Reviewed and gave pt Written education/contact information.

## 2011-02-20 NOTE — Discharge Summary (Signed)
Patient ID: Keith Flores,  MRN: [id number], DOB/AGE: 1960/12/13 51 y.o.  Admit date: 02/17/2011 Discharge date: 02/20/2011  Primary Care Provider: None Primary Cardiologist: M. Cooper  Discharge Diagnoses Principal Problem:  *Hypertensive urgency Active Problems:  S/P CABG x 4  Coronary atherosclerosis of native coronary artery  Mixed hyperlipidemia  Chest pain  Diabetes mellitus   Allergies No Known Allergies  Procedures 02/18/2011 CTA Chest IMPRESSION:  1.  Technically adequate study without pulmonary embolus. 2.  Expected postoperative appearance following CABG.  No complicating features. 3.  Small left pleural effusion and dependent atelectasis in the left lung. _______________________________________________________  History of Present Illness  51 y/o male with recent h/o CABG x 4 in Dec 2012 who was in his USOH until approx 2 days prior to admission when he began to experience intermittent chest pressure, similar to prior angina, lasting 30 mins and resolving spontaneously.  He presented to [facility] ED on 1/29 with these complaints and was found to be markedly hypertensive with systolic pressures > and diastolics in the 120's.  His ECG was non-acute and initial troponin was negative.  He was placed on IV NTG and admitted for further evaluation and mgmt of Hypertensive Urgency.  Hospital Course   Following admission, chest pain resolved as BP improved.  He was weaned off of IV NTG.  His home dose of Beta-blocker was titrated and he was placed on oral HCTZ.  With these changes his BP improved though was still trending in the 140's-150's.  As a result, we have added amlodipine therapy today.  As noted above, his c/p resolved.  His ECG remained non-acute.  His second troponin was mildly elevated @ 0.35 but his f/u troponin was again less than 0.30.  It was felt that slight elevation was non-specific.  Pt will be d/c home today in good condition.  We have arranged  for early f/u in our office for BP mgmt and medication titration.  Further, we have arranged for an outpt Renal artery duplex in a few weeks.  Discharge Vitals:  Blood pressure 163/98, pulse 86, temperature 98.1 F (36.7 C), temperature source Oral, resp. rate 18, height 5\' 7"  (1.702 m), weight 205 lb 12.8 oz (93.35 kg), SpO2 90.00%.   Labs: CBC:  Basename 02/19/11 0513 02/18/11 0524 02/18/11 0003  WBC 6.8 6.8 --  NEUTROABS -- -- 4.0  HGB 17.7* 16.7 --  HCT 50.7 48.7 --  MCV 85.4 86.5 --  PLT 199 183 --   Basic Metabolic Panel:  Basename 02/19/11 0513 02/18/11 0524 02/18/11 0003  NA 137 -- 139  K 3.8 -- 3.6  CL 99 -- 99  CO2 28 -- 28  GLUCOSE 111* -- 134*  BUN 14 -- 17  CREATININE 0.94 0.93 --  CALCIUM 9.7 -- 9.7  MG -- 2.0 --  PHOS -- -- --   Cardiac Enzymes:  Basename 02/19/11 1134 02/18/11 1648 02/18/11 1102  CKTOTAL 49 61 57  CKMB 2.4 3.6 3.6  CKMBINDEX -- -- --  TROPONINI <0.30 0.35* <0.30   D-Dimer:  Basename 02/18/11 1102  DDIMER 0.59*   Hemoglobin A1C:  Basename 02/18/11 0524  HGBA1C 5.7*   Fasting Lipid Panel:  Basename 02/18/11 0524  CHOL 142  HDL 32*  LDLCALC 88  TRIG 409  CHOLHDL 4.4  LDLDIRECT --   Thyroid Function Tests:  Basename 02/18/11 0524  TSH 2.950  T4TOTAL --  T3FREE --  THYROIDAB --    Disposition:  Follow-up Information    Follow  up with Tereso Newcomer, PA on 02/25/2011. (8:30am)    Contact information:   [address] [place] [address] [phone]       Follow up with [facility] on 03/12/2011. (8:30am - For Renal (Kidney) Artery ultrasound)    Contact information:   [address] [place], [place] [phone]         Discharge Medications:  Medication List  As of 02/20/2011 11:50 AM   TAKE these medications         amLODipine 5 MG tablet   Commonly known as: NORVASC   Take 1 tablet (5 mg total) by mouth daily.      aspirin 325 MG tablet   Take 1 tablet  (325 mg total) by mouth daily.      enalapril 10 MG tablet   Commonly known as: VASOTEC   Take 1 tablet (10 mg total) by mouth 2 (two) times daily.      hydrochlorothiazide 25 MG tablet   Commonly known as: HYDRODIURIL   Take 0.5 tablets (12.5 mg total) by mouth daily.      metFORMIN 1000 MG tablet   Commonly known as: GLUCOPHAGE   Take 1 tablet (1,000 mg total) by mouth daily. One time daily after lunch      metoprolol 100 MG tablet   Commonly known as: LOPRESSOR   Take 1 tablet (100 mg total) by mouth 2 (two) times daily.      nitroGLYCERIN 0.4 MG SL tablet   Commonly known as: NITROSTAT   Place 1 tablet (0.4 mg total) under the tongue every 5 (five) minutes x 3 doses as needed for chest pain.            Outstanding Labs/Studies  F/u BMET at time of cardiology f/u (new HCTZ)  Duration of Discharge Encounter: Greater than 30 minutes including physician time.  Signed, Nicolasa Ducking NP 02/20/2011, 11:50 AM

## 2011-02-21 ENCOUNTER — Telehealth: Payer: Self-pay | Admitting: Cardiovascular Disease

## 2011-02-21 ENCOUNTER — Other Ambulatory Visit: Payer: Self-pay

## 2011-02-21 DIAGNOSIS — I2510 Atherosclerotic heart disease of native coronary artery without angina pectoris: Secondary | ICD-10-CM

## 2011-02-21 DIAGNOSIS — I10 Essential (primary) hypertension: Secondary | ICD-10-CM

## 2011-02-21 DIAGNOSIS — R0602 Shortness of breath: Secondary | ICD-10-CM

## 2011-02-21 MED ORDER — ENALAPRIL MALEATE 10 MG PO TABS: 10.0000 mg | ORAL_TABLET | Freq: Two times a day (BID) | ORAL | Status: DC

## 2011-02-21 NOTE — Telephone Encounter (Signed)
Patient called need rx refill for  Enalapril 10 mg , sent reorder to pharmancy

## 2011-02-21 NOTE — Progress Notes (Signed)
02/20/11 - 1500 -  Pt d/c home with instructions, r/x, and f/u appointments.  Pt verbalized understanding of instructions, escorted self out (per his request).  Ninetta Lights

## 2011-02-24 NOTE — Discharge Summary (Signed)
See progress notes Brian Crenshaw  

## 2011-02-25 ENCOUNTER — Ambulatory Visit (INDEPENDENT_AMBULATORY_CARE_PROVIDER_SITE_OTHER): Payer: Medicaid Other | Admitting: Physician Assistant

## 2011-02-25 ENCOUNTER — Encounter: Payer: Self-pay | Admitting: Physician Assistant

## 2011-02-25 VITALS — BP 132/80 | HR 87 | Ht 67.0 in | Wt 207.0 lb

## 2011-02-25 DIAGNOSIS — R079 Chest pain, unspecified: Secondary | ICD-10-CM

## 2011-02-25 DIAGNOSIS — E119 Type 2 diabetes mellitus without complications: Secondary | ICD-10-CM

## 2011-02-25 DIAGNOSIS — I2510 Atherosclerotic heart disease of native coronary artery without angina pectoris: Secondary | ICD-10-CM

## 2011-02-25 DIAGNOSIS — I10 Essential (primary) hypertension: Secondary | ICD-10-CM

## 2011-02-25 DIAGNOSIS — E782 Mixed hyperlipidemia: Secondary | ICD-10-CM

## 2011-02-25 LAB — BASIC METABOLIC PANEL
CO2: 27 mEq/L (ref 19–32)
Creatinine, Ser: 1.1 mg/dL (ref 0.4–1.5)
Potassium: 3.9 mEq/L (ref 3.5–5.1)
Sodium: 138 mEq/L (ref 135–145)

## 2011-02-25 MED ORDER — AMLODIPINE BESYLATE 10 MG PO TABS: 10.0000 mg | ORAL_TABLET | Freq: Every day | ORAL | Status: DC

## 2011-02-25 NOTE — Assessment & Plan Note (Signed)
He will establish with Dr. Mikeal Hawthorne next week.

## 2011-02-25 NOTE — Assessment & Plan Note (Signed)
Proceed to stress testing as noted.  Continue aspirin.

## 2011-02-25 NOTE — Patient Instructions (Signed)
Your physician recommends that you schedule a follow-up appointment in: 2 weeks with Dr Excell Seltzer or Tereso Newcomer,  PA-C on a day Dr Excell Seltzer is here Your physician recommends that you return for lab work in: today [facility]) Your physician has requested that you have an echocardiogram. Echocardiography is a painless test that uses sound waves to create images of your heart. It provides your doctor with information about the size and shape of your heart and how well your heart's chambers and valves are working. This procedure takes approximately one hour. There are no restrictions for this procedure.  Your physician has requested that you have en exercise stress myoview. For further information please visit [email or web address] Please follow instruction sheet, as given.

## 2011-02-25 NOTE — Assessment & Plan Note (Signed)
I am not sure why he is not currently on a statin.  We will address this further at his followup visit.

## 2011-02-25 NOTE — Assessment & Plan Note (Addendum)
He did not take his medicines today.  However, he still needs better control.  Increase Norvasc to 10 mg daily.  Check a basic metabolic panel today.  Renal arterial Dopplers are pending.

## 2011-02-25 NOTE — Progress Notes (Signed)
[address] [place], [place]  [address] Phone: [phone] Fax:  [phone]  Date:  02/25/2011   Name:  Keith Flores       DOB:  09-Sep-1960 MRN:  [id number]  PCP:  Dr. Mikeal Hawthorne  Primary Cardiologist:  Dr. [name]  Primary Electrophysiologist:  None    History of Present Illness: Keith Flores is a 51 y.o. male who presents for post hospital follow up.  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  He had previously undergone cardiac catheterization in [place] 10/12.  Postop course was complicated by atrial fibrillation treated with amiodarone.  Other history includes hypertension, hyperlipidemia and diabetes.  He was last seen in followup by Dr. Excell Seltzer 12/27.  He was then admitted 1/28-1/31 with chest pain.  This is similar to his previous angina.  He had a nonspecific elevation in his troponin.  One troponin was 0.35.  The other 2 were negative.  D-dimer was slightly elevated.  Chest CT demonstrated no pulmonary embolus, postoperative appearance following CABG and small left pleural effusion and dependent atelectasis in the left lung.  The patient's blood pressure was significantly elevated (200/120 or higher).  His chest pain improved with better blood pressure control.  He was discharged to home with plans for followup outpatient renal arterial Dopplers.  Labs: Hemoglobin 17.7, potassium 3.8, creatinine 0.94, A1c 5.7, TC 142, HDL 32, LDL 88, TG 109, TSH 2.950.  He is here with an interpreter today.  He continues to c/o chest pain.  He points to his anterior chest.  He feels it in his back.  He notes it with exertion when he walks in the cold.  He has it when he lays down.  He puts a pillow on his chest to help.  He denies shortness of breath.  He denies syncope or palpitations.  He denies PND or significant pedal edema.  He has not taken his blood pressure medicines yet today.  Past Medical History  Diagnosis Date    . Hypertension   . Diabetes mellitus   . Asthma   . Hyperlipidemia   . Coronary artery disease     CABG 12/12 (LIMA-LAD, SVG-DX, SVG-OM, SVG-PDA) c/b post op AFib Rx with amio Rx  . Chest pain     01/2011 in setting of HTN urgency: Chest CT demonstrated no pulmonary embolus, postoperative appearance following CABG and small left pleural effusion and dependent atelectasis in the left lung    Current Outpatient Prescriptions  Medication Sig Dispense Refill  . amLODipine (NORVASC) 5 MG tablet Take 1 tablet (5 mg total) by mouth daily.  30 tablet  6  . aspirin 325 MG tablet Take 1 tablet (325 mg total) by mouth daily.      . enalapril (VASOTEC) 10 MG tablet Take 1 tablet (10 mg total) by mouth 2 (two) times daily.  30 tablet  3  . hydrochlorothiazide (HYDRODIURIL) 25 MG tablet Take 0.5 tablets (12.5 mg total) by mouth daily.  30 tablet  3  . metFORMIN (GLUCOPHAGE) 1000 MG tablet Take 1 tablet (1,000 mg total) by mouth daily. One time daily after lunch  30 tablet  2  . metoprolol (LOPRESSOR) 100 MG tablet Take 1 tablet (100 mg total) by mouth 2 (two) times daily.  60 tablet  6  . nitroGLYCERIN (NITROSTAT) 0.4 MG SL tablet Place 1 tablet (0.4 mg total) under the tongue every 5 (five) minutes x 3 doses as needed for  chest pain.  25 tablet  3    Allergies: No Known Allergies  History  Substance Use Topics  . Smoking status: Former Smoker    Quit date: 10/21/2010  . Smokeless tobacco: Never Used  . Alcohol Use: No     ROS:  Please see the history of present illness.    All other systems reviewed and negative.   PHYSICAL EXAM: VS:  BP 132/80  Pulse 87  Ht 5\' 7"  (1.702 m)  Wt 207 lb (93.895 kg)  BMI 32.42 kg/m2 Repeat blood pressure by me: Left 144/94; right 150/100  Well nourished, well developed, in no acute distress HEENT: normal Neck: no JVD Cardiac:  normal S1, S2; RRR; no murmur Chest: Median sternotomy scar well healed without erythema or discharge; slight amount of pain  with palpation noted over left chest Lungs:  clear to auscultation bilaterally, no wheezing, rhonchi or rales Abd: soft, nontender, no hepatomegaly Ext: no edema Skin: warm and dry Neuro:  CNs 2-12 intact, no focal abnormalities noted  EKG:  Sinus rhythm, heart rate 87, rightward axis, Nonspecific ST-T wave changes prolonged QT (QTc 462 ms), No change from prior tracing  ASSESSMENT AND PLAN:

## 2011-02-25 NOTE — Assessment & Plan Note (Signed)
His chest pain is somewhat atypical.  I question whether or not this may be musculoskeletal symptoms from bypass.  However, he notes that he did not have chest pain when he was discharged after his bypass.  I discussed his case further with Dr. Excell Seltzer.  We will arrange an ETT-Myoview.  I will also set him up for an echocardiogram.  As noted above, he had a chest CT that was unremarkable.  Followup in 2 weeks.

## 2011-02-27 ENCOUNTER — Telehealth: Payer: Self-pay | Admitting: Cardiovascular Disease

## 2011-02-27 NOTE — Telephone Encounter (Signed)
Walk In Pt Form " Need Note ASAP stating he Cannot Return to Work" sent to  [facility]   02/27/11/KM

## 2011-03-03 ENCOUNTER — Other Ambulatory Visit: Payer: Self-pay

## 2011-03-03 ENCOUNTER — Emergency Department (HOSPITAL_COMMUNITY)
Admission: EM | Admit: 2011-03-03 | Discharge: 2011-03-04 | Disposition: A | Discharge status: Home / Self Care | Payer: Medicaid Other | Attending: Internal Medicine | Admitting: Internal Medicine

## 2011-03-03 ENCOUNTER — Ambulatory Visit (HOSPITAL_COMMUNITY): Payer: Medicaid Other | Attending: Cardiology | Admitting: Radiology

## 2011-03-03 ENCOUNTER — Emergency Department (HOSPITAL_COMMUNITY): Payer: Medicaid Other

## 2011-03-03 ENCOUNTER — Encounter (HOSPITAL_COMMUNITY): Payer: Self-pay | Admitting: *Deleted

## 2011-03-03 DIAGNOSIS — R0789 Other chest pain: Secondary | ICD-10-CM | POA: Insufficient documentation

## 2011-03-03 DIAGNOSIS — E785 Hyperlipidemia, unspecified: Secondary | ICD-10-CM | POA: Insufficient documentation

## 2011-03-03 DIAGNOSIS — R9431 Abnormal electrocardiogram [ECG] [EKG]: Secondary | ICD-10-CM | POA: Insufficient documentation

## 2011-03-03 DIAGNOSIS — Z79899 Other long term (current) drug therapy: Secondary | ICD-10-CM | POA: Insufficient documentation

## 2011-03-03 DIAGNOSIS — R42 Dizziness and giddiness: Secondary | ICD-10-CM | POA: Insufficient documentation

## 2011-03-03 DIAGNOSIS — I2510 Atherosclerotic heart disease of native coronary artery without angina pectoris: Secondary | ICD-10-CM | POA: Insufficient documentation

## 2011-03-03 DIAGNOSIS — R079 Chest pain, unspecified: Secondary | ICD-10-CM

## 2011-03-03 DIAGNOSIS — R072 Precordial pain: Secondary | ICD-10-CM

## 2011-03-03 DIAGNOSIS — J45909 Unspecified asthma, uncomplicated: Secondary | ICD-10-CM | POA: Insufficient documentation

## 2011-03-03 DIAGNOSIS — E119 Type 2 diabetes mellitus without complications: Secondary | ICD-10-CM | POA: Insufficient documentation

## 2011-03-03 DIAGNOSIS — R61 Generalized hyperhidrosis: Secondary | ICD-10-CM | POA: Insufficient documentation

## 2011-03-03 DIAGNOSIS — M542 Cervicalgia: Secondary | ICD-10-CM | POA: Insufficient documentation

## 2011-03-03 DIAGNOSIS — R002 Palpitations: Secondary | ICD-10-CM | POA: Diagnosis present

## 2011-03-03 DIAGNOSIS — I10 Essential (primary) hypertension: Secondary | ICD-10-CM | POA: Insufficient documentation

## 2011-03-03 DIAGNOSIS — Z7982 Long term (current) use of aspirin: Secondary | ICD-10-CM | POA: Insufficient documentation

## 2011-03-03 DIAGNOSIS — R6884 Jaw pain: Secondary | ICD-10-CM | POA: Insufficient documentation

## 2011-03-03 LAB — PRO B NATRIURETIC PEPTIDE: Pro B Natriuretic peptide (BNP): 146.4 pg/mL — ABNORMAL HIGH (ref 0–125)

## 2011-03-03 LAB — POCT I-STAT, CHEM 8
Chloride: 98 mEq/L (ref 96–112)
HCT: 58 % — ABNORMAL HIGH (ref 39.0–52.0)
Hemoglobin: 19.7 g/dL — ABNORMAL HIGH (ref 13.0–17.0)
Potassium: 3.2 mEq/L — ABNORMAL LOW (ref 3.5–5.1)
Sodium: 138 mEq/L (ref 135–145)

## 2011-03-03 LAB — DIFFERENTIAL
Basophils Relative: 1 % (ref 0–1)
Eosinophils Absolute: 0.4 10*3/uL (ref 0.0–0.7)
Lymphs Abs: 2 10*3/uL (ref 0.7–4.0)
Neutro Abs: 4.5 10*3/uL (ref 1.7–7.7)
Neutrophils Relative %: 61 % (ref 43–77)

## 2011-03-03 LAB — CBC
MCH: 29.4 pg (ref 26.0–34.0)
MCHC: 34.8 g/dL (ref 30.0–36.0)
Platelets: 210 10*3/uL (ref 150–400)
RBC: 6.29 MIL/uL — ABNORMAL HIGH (ref 4.22–5.81)

## 2011-03-03 LAB — PROTIME-INR
INR: 1.06 (ref 0.00–1.49)
Prothrombin Time: 14 seconds (ref 11.6–15.2)

## 2011-03-03 MED ORDER — ASPIRIN 81 MG PO CHEW: 324.0000 mg | CHEWABLE_TABLET | Freq: Once | ORAL | Status: DC

## 2011-03-03 MED ORDER — POTASSIUM CHLORIDE CRYS ER 20 MEQ PO TBCR
40.0000 meq | EXTENDED_RELEASE_TABLET | Freq: Once | ORAL | Status: AC
  Administered 2011-03-03: 40 meq via ORAL
  Filled 2011-03-03: qty 2

## 2011-03-03 MED ORDER — SODIUM CHLORIDE 0.9 % IV SOLN
999.0000 mL | INTRAVENOUS | Status: DC
  Administered 2011-03-03: 999 mL via INTRAVENOUS

## 2011-03-03 NOTE — ED Notes (Signed)
Juice of choice given per his request. Stated not hunger. Zero need voiced.

## 2011-03-03 NOTE — ED Notes (Signed)
EDP in room to see pt now.

## 2011-03-03 NOTE — ED Notes (Signed)
Pt here from home.  Had CABG in Dec 2012.  Reports CP that is on the (L) side, radiating to his (R) neck and shoulder.  States that he is non-tender on palpation.  Pt took 2 pills that he 'put under his tongue at home without relief.  States that the pills made him feel like he was going to pass out and made his head feel 'hot'.  Pt denies a HA.  States that his BP and CBG was high at home.  BP in the 200s.  Unsure of CBG.  Pt ambulatory without difficulty.  Pt noted to be red in the face and diaphoretic.  Denies SOB or N/V.

## 2011-03-03 NOTE — ED Provider Notes (Addendum)
History     CSN: [id number]  Arrival date & time 03/03/11  2051   First MD Initiated Contact with Patient 03/03/11 2134      Chief Complaint  Patient presents with  . Chest Pain    (Consider location/radiation/quality/duration/timing/severity/associated sxs/prior treatment) Patient is a 51 y.o. male presenting with chest pain. The history is provided by the patient and medical records.  Chest Pain The chest pain began 1 - 2 hours ago. Duration of episode(s) is 2 hours. Chest pain occurs constantly. The chest pain is resolved. The pain is associated with eating. The pain is currently at 0/10. The severity of the pain is severe. The quality of the pain is described as heavy and pounding. The pain radiates to the right jaw and right neck. Primary symptoms include palpitations. Pertinent negatives for primary symptoms include no fever, no fatigue, no syncope, no shortness of breath, no cough, no wheezing, no abdominal pain, no nausea, no vomiting and no dizziness.  The palpitations did not occur with syncope, dizziness or shortness of breath.   Associated symptoms include diaphoresis.  Pertinent negatives for associated symptoms include no claudication, no lower extremity edema, no near-syncope, no numbness, no orthopnea and no weakness. He tried nothing for the symptoms. Risk factors include male gender and smoking/tobacco exposure.  His past medical history is significant for CAD, diabetes, hyperlipidemia and hypertension.  Pertinent negatives for past medical history include no PE.  Procedure history is positive for cardiac catheterization. Procedure history comments: cabg x 4 on 12/12.     Past Medical History  Diagnosis Date  . Hypertension   . Diabetes mellitus   . Asthma   . Hyperlipidemia   . Coronary artery disease     CABG 12/12 (LIMA-LAD, SVG-DX, SVG-OM, SVG-PDA) c/b post op AFib Rx with amio Rx  . Chest pain     01/2011 in setting of HTN urgency: Chest CT demonstrated no  pulmonary embolus, postoperative appearance following CABG and small left pleural effusion and dependent atelectasis in the left lung    Past Surgical History  Procedure Date  . Cardiac catheterization   . Coronary artery bypass graft 12/30/2010    Procedure: CORONARY ARTERY BYPASS GRAFTING (CABG);  Surgeon: Delight Ovens, MD;  Location: [facility] OR;  Service: Open Heart Surgery;  Laterality: N/A;    Family History  Problem Relation Age of Onset  . Anesthesia problems Neg Hx   . Hypotension Neg Hx   . Malignant hyperthermia Neg Hx   . Pseudochol deficiency Neg Hx     History  Substance Use Topics  . Smoking status: Former Smoker    Quit date: 10/21/2010  . Smokeless tobacco: Never Used  . Alcohol Use: No      Review of Systems  Constitutional: Positive for diaphoresis. Negative for fever, chills, activity change, appetite change and fatigue.  HENT: Negative for ear pain, congestion, sore throat, rhinorrhea, neck pain, postnasal drip and sinus pressure.   Eyes: Negative for visual disturbance.  Respiratory: Positive for chest tightness. Negative for cough, shortness of breath and wheezing.   Cardiovascular: Positive for chest pain and palpitations. Negative for orthopnea, claudication, leg swelling, syncope and near-syncope.  Gastrointestinal: Negative for nausea, vomiting, abdominal pain and diarrhea.  Genitourinary: Negative.   Musculoskeletal: Negative for myalgias and back pain.  Skin: Negative for color change, pallor and rash.  Neurological: Positive for light-headedness and headaches. Negative for dizziness, tremors, syncope, weakness and numbness.  Hematological: Does not bruise/bleed easily.  Psychiatric/Behavioral: Negative  for confusion.    Allergies  Review of patient's allergies indicates no known allergies.  Home Medications   Current Outpatient Rx  Name Route Sig Dispense Refill  . AMLODIPINE BESYLATE 10 MG PO TABS Oral Take 1 tablet (10 mg total) by  mouth daily. 30 tablet 6  . ASPIRIN 325 MG PO TABS Oral Take 1 tablet (325 mg total) by mouth daily.    . ENALAPRIL MALEATE 10 MG PO TABS Oral Take 1 tablet (10 mg total) by mouth 2 (two) times daily. 30 tablet 3  . HYDROCHLOROTHIAZIDE 25 MG PO TABS Oral Take 0.5 tablets (12.5 mg total) by mouth daily. 30 tablet 3  . METFORMIN HCL 1000 MG PO TABS Oral Take 1 tablet (1,000 mg total) by mouth daily. One time daily after lunch 30 tablet 2  . METOPROLOL TARTRATE 100 MG PO TABS Oral Take 1 tablet (100 mg total) by mouth 2 (two) times daily. 60 tablet 6  . NITROGLYCERIN 0.4 MG SL SUBL Sublingual Place 1 tablet (0.4 mg total) under the tongue every 5 (five) minutes x 3 doses as needed for chest pain. 25 tablet 3    BP 142/86  Pulse 83  Temp(Src) 98 F (36.7 C) (Oral)  Resp 21  SpO2 95%  Physical Exam  Nursing note and vitals reviewed. Constitutional: He is oriented to person, place, and time. He appears well-developed and well-nourished. No distress.  HENT:  Head: Normocephalic and atraumatic.  Mouth/Throat: Oropharynx is clear and moist.  Eyes: EOM are normal. Pupils are equal, round, and reactive to light.  Neck: Normal range of motion. Neck supple. No JVD present. No tracheal deviation present.  Cardiovascular: Normal rate, regular rhythm, S1 normal, S2 normal, normal heart sounds and intact distal pulses.   No extrasystoles are present. PMI is not displaced.  Exam reveals no gallop and no friction rub.   No murmur heard. Pulmonary/Chest: Effort normal and breath sounds normal. No accessory muscle usage or stridor. Not tachypneic. No respiratory distress. He has no decreased breath sounds. He has no wheezes. He has no rhonchi. He has no rales. He exhibits no tenderness, no bony tenderness, no crepitus and no retraction.       Well-healing midline sternotomy scar. Pressure applied to the chest wall does not reproduce pain.  Abdominal: Soft. Bowel sounds are normal. He exhibits no distension  and no mass. There is no tenderness. There is no rebound and no guarding.  Musculoskeletal: Normal range of motion. He exhibits no edema and no tenderness.  Neurological: He is alert and oriented to person, place, and time. No cranial nerve deficit. He exhibits normal muscle tone.  Skin: Skin is warm and dry. No rash noted. He is not diaphoretic. No erythema. No pallor.  Psychiatric: He has a normal mood and affect. His behavior is normal. Judgment and thought content normal.    ED Course  Procedures (including critical care time)   Date: 03/03/2011  Rate: 83  Rhythm: normal sinus rhythm  QRS Axis: normal  Intervals: normal  ST/T Wave abnormalities: T-wave inversions in the inferior and anterolateral leads that are new since previous tracing just 2 weeks ago.  Conduction Disutrbances:none  Narrative Interpretation: T-wave inversions concerning for possible ischemia. No STEMI.  Old EKG Reviewed: changes noted   Labs Reviewed  POCT I-STAT, CHEM 8 - Abnormal; Notable for the following:    Potassium 3.2 (*)    Glucose, Bld 187 (*)    Hemoglobin 19.7 (*)    HCT 58.0 (*)  All other components within normal limits  POCT I-STAT TROPONIN I  CBC  DIFFERENTIAL  PRO B NATRIURETIC PEPTIDE  PROTIME-INR  APTT  CK TOTAL AND CKMB  11:05 PM I discussed the patient by telephone with Dr. Mikeal Hawthorne who on hearing of the ECG changes in the patient's recent Cardiologic interventions requests that I discussed the patient with cardiology. The patient tells me that he has an appointment with Dr. Excell Seltzer of [place] cardiology in the morning. Dr. Mikeal Hawthorne says he will come by to see the patient, and I will speak with the on-call cardiologist about the patient's presentation to discuss disposition. 11:28 PM I discussed the patient's case with Dr. Mayford Knife of cardiology on-call by telephone, and he defers admission to internal medicine and acknowledges that cardiology will get consultation. Dr. Mikeal Hawthorne has been  paged. No results found.   No diagnosis found.    MDM  Unstable/new onset angina, ACS, AMI, Musculoskeletal chest pain, costochondritis, GERD, Gastrointestinal Chest Pain, Pleuritic Chest Pain, Pneumonia, Pneumothorax, Pulmonary Embolism, Esophageal Spasm, Arrhythmia considered among other potential etiologies in the patient's differential diagnosis.          Felisa Bonier, MD 03/03/11 2202  Felisa Bonier, MD 03/03/11 4540  Felisa Bonier, MD 03/03/11 2329

## 2011-03-03 NOTE — ED Notes (Signed)
Pt denies pain.  No distress noted.  Pt resting, awaiting admitting MD.

## 2011-03-03 NOTE — ED Notes (Signed)
Admitted pt from home with complain of SOB, CP and  fainting after taking home med,  Stated "he felt like he will pass out", also stated BP was very high at home. Pt is S/P CABG dec of 2012.  Pt is very sweating, warm to touch. Pt is A/Ox3, MAEx4. denies any CP on admission, VSS. Waiting on MD to see pt. Pt oriented to room and call light and will cont to monitor.

## 2011-03-03 NOTE — ED Notes (Addendum)
C/o CP, onset 2 hrs ago, took 2 ntg PTA, denies pain at this time. Pt did not c/o pain or CP on arrival. Stated he was here for high blood pressure & HA.  Language barrier. Had CABG 1 month ago.

## 2011-03-04 ENCOUNTER — Encounter (HOSPITAL_COMMUNITY): Payer: Self-pay | Admitting: Internal Medicine

## 2011-03-04 ENCOUNTER — Ambulatory Visit (HOSPITAL_COMMUNITY): Payer: Medicaid Other | Attending: Physician Assistant | Admitting: Radiology

## 2011-03-04 VITALS — BP 143/100 | Ht 67.0 in | Wt 204.0 lb

## 2011-03-04 DIAGNOSIS — E119 Type 2 diabetes mellitus without complications: Secondary | ICD-10-CM | POA: Insufficient documentation

## 2011-03-04 DIAGNOSIS — I10 Essential (primary) hypertension: Secondary | ICD-10-CM | POA: Insufficient documentation

## 2011-03-04 DIAGNOSIS — R002 Palpitations: Secondary | ICD-10-CM | POA: Diagnosis present

## 2011-03-04 DIAGNOSIS — Z951 Presence of aortocoronary bypass graft: Secondary | ICD-10-CM | POA: Insufficient documentation

## 2011-03-04 DIAGNOSIS — R079 Chest pain, unspecified: Secondary | ICD-10-CM | POA: Insufficient documentation

## 2011-03-04 DIAGNOSIS — Z87891 Personal history of nicotine dependence: Secondary | ICD-10-CM | POA: Insufficient documentation

## 2011-03-04 DIAGNOSIS — E669 Obesity, unspecified: Secondary | ICD-10-CM | POA: Insufficient documentation

## 2011-03-04 DIAGNOSIS — I2510 Atherosclerotic heart disease of native coronary artery without angina pectoris: Secondary | ICD-10-CM

## 2011-03-04 DIAGNOSIS — E785 Hyperlipidemia, unspecified: Secondary | ICD-10-CM | POA: Insufficient documentation

## 2011-03-04 LAB — CARDIAC PANEL(CRET KIN+CKTOT+MB+TROPI)
CK, MB: 2.1 ng/mL (ref 0.3–4.0)
Relative Index: INVALID (ref 0.0–2.5)
Total CK: 60 U/L (ref 7–232)
Troponin I: <0.3 ng/mL (ref <0.30)

## 2011-03-04 MED ORDER — TECHNETIUM TC 99M TETROFOSMIN IV KIT
11.0000 | PACK | Freq: Once | INTRAVENOUS | Status: AC | PRN
  Administered 2011-03-04: 11 via INTRAVENOUS

## 2011-03-04 MED ORDER — REGADENOSON 0.4 MG/5ML IV SOLN
0.4000 mg | Freq: Once | INTRAVENOUS | Status: AC
  Administered 2011-03-04: 0.4 mg via INTRAVENOUS

## 2011-03-04 MED ORDER — TECHNETIUM TC 99M TETROFOSMIN IV KIT
33.0000 | PACK | Freq: Once | INTRAVENOUS | Status: AC | PRN
  Administered 2011-03-04: 33 via INTRAVENOUS

## 2011-03-04 NOTE — Discharge Instructions (Signed)
Chest Pain (Nonspecific) It is often hard to give a specific diagnosis for the cause of chest pain. There is always a chance that your pain could be related to something serious, such as a heart attack or a blood clot in the lungs. You need to follow up with your caregiver for further evaluation. CAUSES   Heartburn.   Pneumonia or bronchitis.   Anxiety and stress.   Inflammation around your heart (pericarditis) or lung (pleuritis or pleurisy).   A blood clot in the lung.   A collapsed lung (pneumothorax). It can develop suddenly on its own (spontaneous pneumothorax) or from injury (trauma) to the chest.  The chest wall is composed of bones, muscles, and cartilage. Any of these can be the source of the pain.  The bones can be bruised by injury.   The muscles or cartilage can be strained by coughing or overwork.   The cartilage can be affected by inflammation and become sore (costochondritis).  DIAGNOSIS  Lab tests or other studies, such as X-rays, an EKG, stress testing, or cardiac imaging, may be needed to find the cause of your pain.  TREATMENT   Treatment depends on what may be causing your chest pain. Treatment may include:   Acid blockers for heartburn.   Anti-inflammatory medicine.   Pain medicine for inflammatory conditions.   Antibiotics if an infection is present.   You may be advised to change lifestyle habits. This includes stopping smoking and avoiding caffeine and chocolate.   You may be advised to keep your head raised (elevated) when sleeping. This reduces the chance of acid going backward from your stomach into your esophagus.   Most of the time, nonspecific chest pain will improve within 2 to 3 days with rest and mild pain medicine.  HOME CARE INSTRUCTIONS   If antibiotics were prescribed, take the full amount even if you start to feel better.   For the next few days, avoid physical activities that bring on chest pain. Continue physical activities as  directed.   Do not smoke cigarettes or drink alcohol until your symptoms are gone.   Only take over-the-counter or prescription medicine for pain, discomfort, or fever as directed by your caregiver.   Follow your caregiver's suggestions for further testing if your chest pain does not go away.   Keep any follow-up appointments you made. If you do not go to an appointment, you could develop lasting (chronic) problems with pain. If there is any problem keeping an appointment, you must call to reschedule.  SEEK MEDICAL CARE IF:   You think you are having problems from the medicine you are taking. Read your medicine instructions carefully.   Your chest pain does not go away, even after treatment.   You develop a rash with blisters on your chest.  SEEK IMMEDIATE MEDICAL CARE IF:   You have increased chest pain or pain that spreads to your arm, neck, jaw, back, or belly (abdomen).   You develop shortness of breath, an increasing cough, or you are coughing up blood.   You have severe back or abdominal pain, feel sick to your stomach (nauseous) or throw up (vomit).   You develop severe weakness, fainting, or chills.   You have an oral temperature above 102 F (38.9 C), not controlled by medicine.  THIS IS AN EMERGENCY. Do not wait to see if the pain will go away. Get medical help at once. Call your local emergency services (911 in [place] Do not drive yourself to   the hospital. MAKE SURE YOU:   Understand these instructions.   Will watch your condition.   Will get help right away if you are not doing well or get worse.  Document Released: 10/16/2004 Document Revised: 09/18/2010 Document Reviewed: 08/12/2007 [employer] Patient Information 2012 [employer]. 

## 2011-03-04 NOTE — Consult Note (Signed)
Reason for Consult:Palpitations Referring Physician:  Dr Carolyne Littles Keith Flores is an 51 y.o. male.  HPI: 51 yo man with hx of CAd s/P CABG in 12/12 here with some palpitations after taking some hot pepper tonight. Was seen in ED and ha some flipped T waves on lateral leads compared with January 2013. He is scheduled to have a Stress test tomorrow by Dr Excell Seltzer and wants to keep that appoint ment. No Chest pain reported.   Past Medical History  Diagnosis Date  . Hypertension   . Diabetes mellitus   . Asthma   . Hyperlipidemia   . Coronary artery disease     CABG 12/12 (LIMA-LAD, SVG-DX, SVG-OM, SVG-PDA) c/b post op AFib Rx with amio Rx  . Chest pain     01/2011 in setting of HTN urgency: Chest CT demonstrated no pulmonary embolus, postoperative appearance following CABG and small left pleural effusion and dependent atelectasis in the left lung    Past Surgical History  Procedure Date  . Cardiac catheterization   . Coronary artery bypass graft 12/30/2010    Procedure: CORONARY ARTERY BYPASS GRAFTING (CABG);  Surgeon: Delight Ovens, MD;  Location: [facility] OR;  Service: Open Heart Surgery;  Laterality: N/A;    Family History  Problem Relation Age of Onset  . Anesthesia problems Neg Hx   . Hypotension Neg Hx   . Malignant hyperthermia Neg Hx   . Pseudochol deficiency Neg Hx     Social History:  reports that he quit smoking about 4 months ago. He has never used smokeless tobacco. He reports that he does not drink alcohol or use illicit drugs.  Allergies: No Known Allergies  Medications: I have reviewed the patient's current medications.  Results for orders placed during the hospital encounter of 03/03/11 (from the past 48 hour(s))  CBC     Status: Abnormal   Collection Time   03/03/11  9:32 PM      Component Value Range Comment   WBC 7.5  4.0 - 10.5 (K/uL)    RBC 6.29 (*) 4.22 - 5.81 (MIL/uL)    Hemoglobin 18.5 (*) 13.0 - 17.0 (g/dL)    HCT 16.1 (*) 09.6 - 52.0 (%)    MCV  84.4  78.0 - 100.0 (fL)    MCH 29.4  26.0 - 34.0 (pg)    MCHC 34.8  30.0 - 36.0 (g/dL)    RDW 04.5  40.9 - 81.1 (%)    Platelets 210  150 - 400 (K/uL)   DIFFERENTIAL     Status: Normal   Collection Time   03/03/11  9:32 PM      Component Value Range Comment   Neutrophils Relative 61  43 - 77 (%)    Neutro Abs 4.5  1.7 - 7.7 (K/uL)    Lymphocytes Relative 26  12 - 46 (%)    Lymphs Abs 2.0  0.7 - 4.0 (K/uL)    Monocytes Relative 7  3 - 12 (%)    Monocytes Absolute 0.5  0.1 - 1.0 (K/uL)    Eosinophils Relative 5  0 - 5 (%)    Eosinophils Absolute 0.4  0.0 - 0.7 (K/uL)    Basophils Relative 1  0 - 1 (%)    Basophils Absolute 0.1  0.0 - 0.1 (K/uL)   POCT I-STAT TROPONIN I     Status: Normal   Collection Time   03/03/11  9:45 PM      Component Value Range Comment   Troponin  i, poc 0.02  0.00 - 0.08 (ng/mL)    Comment 3            POCT I-STAT, CHEM 8     Status: Abnormal   Collection Time   03/03/11  9:46 PM      Component Value Range Comment   Sodium 138  135 - 145 (mEq/L)    Potassium 3.2 (*) 3.5 - 5.1 (mEq/L)    Chloride 98  96 - 112 (mEq/L)    BUN 17  6 - 23 (mg/dL)    Creatinine, Ser 8.11  0.50 - 1.35 (mg/dL)    Glucose, Bld 914 (*) 70 - 99 (mg/dL)    Calcium, Ion 7.82  1.12 - 1.32 (mmol/L)    TCO2 28  0 - 100 (mmol/L)    Hemoglobin 19.7 (*) 13.0 - 17.0 (g/dL)    HCT 95.6 (*) 21.3 - 52.0 (%)   PRO B NATRIURETIC PEPTIDE     Status: Abnormal   Collection Time   03/03/11  9:55 PM      Component Value Range Comment   Pro B Natriuretic peptide (BNP) 146.4 (*) 0 - 125 (pg/mL)   CK TOTAL AND CKMB     Status: Normal   Collection Time   03/03/11  9:55 PM      Component Value Range Comment   Total CK 63  7 - 232 (U/L)    CK, MB 2.0  0.3 - 4.0 (ng/mL)    Relative Index RELATIVE INDEX IS INVALID  0.0 - 2.5    PROTIME-INR     Status: Normal   Collection Time   03/03/11 10:00 PM      Component Value Range Comment   Prothrombin Time 14.0  11.6 - 15.2 (seconds)    INR 1.06  0.00 -  1.49    APTT     Status: Normal   Collection Time   03/03/11 10:00 PM      Component Value Range Comment   aPTT 36  24 - 37 (seconds)     Dg Chest Portable 1 View  03/03/2011  *RADIOLOGY REPORT*  Clinical Data: Chest pain.  PORTABLE CHEST - 1 VIEW  Comparison: Two-view chest 02/17/2011.  Findings: The heart is enlarged.  No edema or effusions are present to suggest failure.  The visualized soft tissues and bony thorax are unremarkable.  The lungs are clear.  IMPRESSION: Stable cardiomegaly without failure.  Original Report Authenticated By: Jamesetta Orleans. MATTERN, M.D.    Review of Systems  Cardiovascular: Positive for palpitations.  All other systems reviewed and are negative.   Blood pressure 132/77, pulse 75, temperature 98 F (36.7 C), temperature source Oral, resp. rate 22, SpO2 94.00%. Physical Exam  Nursing note and vitals reviewed. Constitutional: He is oriented to person, place, and time. He appears well-developed and well-nourished.  HENT:  Head: Normocephalic and atraumatic.  Right Ear: External ear normal.  Left Ear: External ear normal.  Mouth/Throat: Oropharynx is clear and moist.  Eyes: Conjunctivae and EOM are normal. Pupils are equal, round, and reactive to light.  Neck: Normal range of motion. Neck supple.  Cardiovascular: Normal rate, regular rhythm, normal heart sounds and intact distal pulses.   Respiratory: Effort normal and breath sounds normal.  GI: Soft. Bowel sounds are normal.  Musculoskeletal: Normal range of motion.  Neurological: He is alert and oriented to person, place, and time. He has normal reflexes.  Skin: Skin is warm and dry.  Psychiatric: He has a normal mood  and affect. His behavior is normal. Judgment and thought content normal.    Assessment/Plan: 1. Palpitations and EKG changes: patient seen and evaluated. Discussed case with Dr Excell Seltzer the cardiologist on call. Patient does not want to be admir=tted he wants to have a follow up with Dr  Excell Seltzer in am. In light of no Chest pain, we will repeat his Cardiac enzymes. If negative we will let him go home and follow up with Dr Excell Seltzer. If abnormal, we will admit him to the hospital  [facility] 03/04/2011, 12:15 AM

## 2011-03-04 NOTE — ED Notes (Signed)
Pt dc' ed home with best friend, pt is a/ox3, denies any CP or discomfort. dc' ed instruction given and explained to pt no question. Encourage pt to keep his appointment tomorrow as stated early. Zero need voiced.

## 2011-03-04 NOTE — ED Notes (Signed)
Assisted pt to call for his ride.

## 2011-03-04 NOTE — Progress Notes (Signed)
[facility] SITE 3 NUCLEAR MED [address] [place] [address] [phone]  Cardiology Nuclear Med Study  Keith Flores is a 51 y.o. male [id number] [birth year]   Nuclear Med Background Indication for Stress Test:  Evaluation for Ischemia and Graft Patency ; Post Hospital on 02/17/11 for CP and HTN urgency, (-) Enzymes and Post Hospital again on 03/03/11 for CP/Palps with (-) Enzymes History:  12/12 CABG with post op atrial fibrillation Cardiac Risk Factors: History of Smoking, Hypertension, Lipids, NIDDM and Obesity  Symptoms:  Chest Pressure>neck and arm with and without Exertion (last episode of chest discomfort was yesterday and he went to the ED.)   Nuclear Pre-Procedure Caffeine/Decaff Intake:  None NPO After: 12:00am   Lungs:  Clear.  O2 SAT 95% on RA. IV 0.9% NS with Angio Cath:  22g  IV Site: R Hand x 1, tolerated well IV Started by:  Irean Hong, RN  Chest Size (in):  44 Cup Size: n/a  Height: 5\' 7"  (1.702 m)  Weight:  204 lb (92.534 kg)  BMI:  Body mass index is 31.95 kg/(m^2). Tech Comments:  Took Metoprolol 8:00am per patient.  Patient hypertensive today. NOTE: BP was take again post Stress Imaging- 140/90.  W.Deal, RT-N    Nuclear Med Study 1 or 2 day study: 1 day  Stress Test Type:  Lexiscan  Reading MD: [name], MD  Order Authorizing Provider:  [name], MD, and Tereso Newcomer, PA-C  Resting Radionuclide: Technetium 54m Tetrofosmin  Resting Radionuclide Dose: 11.0 mCi   Stress Radionuclide:  Technetium 53m Tetrofosmin  Stress Radionuclide Dose: 33.0 mCi           Stress Protocol Rest HR: 70 Stress HR: 89  Rest BP: Sitting 136/103  Standing 143/100 Stress BP: 143/106  Exercise Time (min): n/a METS: n/a   Predicted Max HR: 169 bpm % Max HR: 52.66 bpm Rate Pressure Product: 21308   Dose of Adenosine (mg):  n/a Dose of Lexiscan: 0.4 mg  Dose of Atropine (mg): n/a Dose of Dobutamine: n/a mcg/kg/min (at max HR)  Stress Test  Technologist: Smiley Houseman, CMA-N  Nuclear Technologist:  Domenic Polite, CNMT     Rest Procedure:  Myocardial perfusion imaging was performed at rest 45 minutes following the intravenous administration of Technetium 35m Tetrofosmin.  Rest ECG: Nonspecific T-wave changes.  Stress Procedure:  The patient received IV Lexiscan 0.4 mg over 15-seconds.  Technetium 53m Tetrofosmin injected at 30-seconds.  There were no significant changes with Lexiscan.  Quantitative spect images were obtained after a 45 minute delay.  Stress ECG: No significant change from baseline ECG  QPS Raw Data Images:  Patient motion noted; appropriate software correction applied. Stress Images:  There is moderate decrease in activity in a small area of of the inferior wall from the base to the apex. There is mild decreased activity at the base of the inferolateral wall Rest Images:  There is normal activity in the inferior wall. There continues to be very slight decreased activity at the base of the inferolateral wall. Subtraction (SDS):  There is ischemia in the inferior wall and very slight ischemia in the inferolateral wall. Transient Ischemic Dilatation (Normal <1.22):  1.10 Lung/Heart Ratio (Normal <0.45):  0.32  Quantitative Gated Spect Images QGS EDV:  106 ml QGS ESV:  45 ml QGS cine images:  Mild hypokinesis at the base of the inferior wall. QGS EF: 57%  Impression Exercise Capacity:  Lexiscan with no exercise. BP Response:  Normal  blood pressure response. Clinical Symptoms:  Shortness of breath and chest pressure ECG Impression:  No significant ST segment change suggestive of ischemia. Comparison with Prior Nuclear Study: No previous nuclear study performed  Overall Impression:  There is moderate ischemia in a small area of the inferior wall that does extend from the base to the apex. There is also a small area of probable scar and ischemia at the base of the inferolateral wall.  [name],  MD

## 2011-03-04 NOTE — ED Provider Notes (Signed)
Second set of cardiac enzymes negative.  Patient is pain free.  Has followup appointment with Dr. Excell Seltzer tomorrow.  Will discharge according to plan of Dr. Fredricka Bonine, Dr. Mikeal Hawthorne, and Dr. Mayford Knife.  Glynn Octave, MD 03/04/11 0130

## 2011-03-05 ENCOUNTER — Encounter: Payer: Self-pay | Admitting: *Deleted

## 2011-03-05 ENCOUNTER — Encounter (HOSPITAL_COMMUNITY): Payer: Self-pay | Admitting: Pharmacy Technician

## 2011-03-05 ENCOUNTER — Ambulatory Visit (INDEPENDENT_AMBULATORY_CARE_PROVIDER_SITE_OTHER): Payer: Medicaid Other | Admitting: Physician Assistant

## 2011-03-05 ENCOUNTER — Encounter: Payer: Self-pay | Admitting: Physician Assistant

## 2011-03-05 VITALS — BP 143/91 | HR 78 | Ht 67.0 in | Wt 204.0 lb

## 2011-03-05 DIAGNOSIS — I2510 Atherosclerotic heart disease of native coronary artery without angina pectoris: Secondary | ICD-10-CM

## 2011-03-05 DIAGNOSIS — R9430 Abnormal result of cardiovascular function study, unspecified: Secondary | ICD-10-CM

## 2011-03-05 DIAGNOSIS — R002 Palpitations: Secondary | ICD-10-CM

## 2011-03-05 DIAGNOSIS — I10 Essential (primary) hypertension: Secondary | ICD-10-CM

## 2011-03-05 DIAGNOSIS — E785 Hyperlipidemia, unspecified: Secondary | ICD-10-CM

## 2011-03-05 DIAGNOSIS — R079 Chest pain, unspecified: Secondary | ICD-10-CM

## 2011-03-05 LAB — CBC WITH DIFFERENTIAL/PLATELET
Basophils Relative: 0.4 % (ref 0.0–3.0)
Eosinophils Absolute: 0.3 10*3/uL (ref 0.0–0.7)
HCT: 55.8 % — ABNORMAL HIGH (ref 39.0–52.0)
Lymphs Abs: 2.1 10*3/uL (ref 0.7–4.0)
MCHC: 32.8 g/dL (ref 30.0–36.0)
MCV: 87.4 fl (ref 78.0–100.0)
Monocytes Absolute: 0.5 10*3/uL (ref 0.1–1.0)
Neutrophils Relative %: 59.4 % (ref 43.0–77.0)
Platelets: 212 10*3/uL (ref 150.0–400.0)
RBC: 6.39 Mil/uL — ABNORMAL HIGH (ref 4.22–5.81)

## 2011-03-05 LAB — BASIC METABOLIC PANEL
BUN: 19 mg/dL (ref 6–23)
CO2: 29 mEq/L (ref 19–32)
Chloride: 98 mEq/L (ref 96–112)
Creatinine, Ser: 1.1 mg/dL (ref 0.4–1.5)

## 2011-03-05 LAB — HEPATIC FUNCTION PANEL
ALT: 43 U/L (ref 0–53)
AST: 24 U/L (ref 0–37)
Albumin: 4.3 g/dL (ref 3.5–5.2)
Total Protein: 7.9 g/dL (ref 6.0–8.3)

## 2011-03-05 LAB — PROTIME-INR: INR: 1 ratio (ref 0.8–1.0)

## 2011-03-05 MED ORDER — PRAVASTATIN SODIUM 40 MG PO TABS: 40.0000 mg | ORAL_TABLET | Freq: Every evening | ORAL | Status: DC

## 2011-03-05 MED ORDER — ISOSORBIDE MONONITRATE ER 30 MG PO TB24: 30.0000 mg | ORAL_TABLET | Freq: Every day | ORAL | Status: DC

## 2011-03-05 NOTE — H&P (Signed)
History and Physical   Date: 03/05/2011   Name: Keith Flores  DOB: 27-Apr-1960  MRN: [id number]   PCP: Dr. Mikeal Hawthorne  Primary Cardiologist: Dr. [name]  Primary Electrophysiologist: None   History of Present Illness:  Keith Flores is a 51 y.o. male who presents for follow up on an abnormal stress test.  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 He had previously undergone cardiac catheterization in [place] 10/12. Postop course was complicated by atrial fibrillation treated with amiodarone. Other history includes hypertension, hyperlipidemia and diabetes. He was admitted last month with chest pain. CEs were essentially normal. Chest CT demonstrated no pulmonary embolus, postoperative appearance following CABG and small left pleural effusion and dependent atelectasis in the left lung. He had very high BP and his symptoms improved with better BP control.   I saw him in follow up 2/5. He continued to note chest pain with exertion. I discussed this with Dr. [name] and we decided to set him up for a stress myoview. Echo was done and demonstrated mild LVH, EF 55%, grade 2 diast dysfxn, mild dilated asc aorta at 3.8 cm. Myoview done yesterday demonstrates mod Inf ischemia and scar + ischemia in inf-lat wall with EF 57%. He went to the ED 2 nights ago with chest pain. Troponins were neg. CXR was unremarkable. K+ was 3.2. He notes he ate some jalapeno pepper prior to chest pain beginning. He denies pain today. No exertional symptoms. No dyspnea, orthopnea, PND or edema. No syncope.    Past Medical History   Diagnosis  Date   .  Hypertension    .  Diabetes mellitus    .  Asthma    .  Hyperlipidemia    .  Coronary artery disease      CABG 12/12 (LIMA-LAD, SVG-DX, SVG-OM, SVG-PDA) c/b post op AFib Rx with amio Rx   .  Chest pain      01/2011 in setting of HTN urgency: Chest CT demonstrated no pulmonary embolus, postoperative appearance  following CABG and small left pleural effusion and dependent atelectasis in the left lung     Current Outpatient Prescriptions   Medication  Sig  Dispense  Refill   .  amLODipine (NORVASC) 5 MG tablet  Take 5 mg by mouth daily.     Marland Kitchen  aspirin 325 MG tablet  Take 1 tablet (325 mg total) by mouth daily.     .  enalapril (VASOTEC) 10 MG tablet  Take 1 tablet (10 mg total) by mouth 2 (two) times daily.  30 tablet  3   .  hydrochlorothiazide (HYDRODIURIL) 25 MG tablet  Take 0.5 tablets (12.5 mg total) by mouth daily.  30 tablet  3   .  metFORMIN (GLUCOPHAGE) 1000 MG tablet  Take 1 tablet (1,000 mg total) by mouth daily. One time daily after lunch  30 tablet  2   .  metoprolol (LOPRESSOR) 100 MG tablet  Take 100 mg by mouth 2 (two) times daily.     .  nitroGLYCERIN (NITROSTAT) 0.4 MG SL tablet  Place 0.4 mg under the tongue every 5 (five) minutes x 3 doses as needed. For chest pain       Allergies:  No Known Allergies  History   Substance Use Topics   .  Smoking status:  Former Smoker     Quit date:  10/21/2010   .  Smokeless tobacco:  Never Used   .  Alcohol Use:  No     Family History   Problem  Relation  Age of Onset   .  Anesthesia problems  Neg Hx    .  Hypotension  Neg Hx    .  Malignant hyperthermia  Neg Hx    .  Pseudochol deficiency  Neg Hx      ROS: Please see the history of present illness. All other systems reviewed and negative.   PHYSICAL EXAM:  VS: BP 143/91  Pulse 78  Ht 5\' 7"  (1.702 m)  Wt 204 lb (92.534 kg)  BMI 31.95 kg/m2  Well nourished, well developed, in no acute distress  HEENT: normal  Neck: no JVD  Cardiac: normal S1, S2; RRR; no murmur  Lungs: clear to auscultation bilaterally, no wheezing, rhonchi or rales  Abd: soft, nontender, no hepatomegaly  Ext: no edema  Skin: warm and dry  Neuro: CNs 2-12 intact, no focal abnormalities noted   EKG: NSR, HR 78, normal axis, NSSTTW changes, similar to prior tracings   ASSESSMENT AND PLAN:   Chest pain -  Tereso Newcomer, PA 03/05/2011 1:27 PM Signed  He is s/p CABG 7 weeks ago. The likelihood he has lost one or more of his grafts is low. But now he has an abnormal myoview with inferior ischemia. Dr. [name] asked me to see him today to arrange LHC. Risks and benefits of cardiac catheterization have been discussed with the patient. These include bleeding, infection, kidney damage, stroke, heart attack, death. The patient understands these risks and is willing to proceed.   Coronary atherosclerosis of native coronary artery - Tereso Newcomer, PA 03/05/2011 1:28 PM Signed  I gave him a note today to remain out of work until March (3 mos post CABG). Continue ASA. I will also start Imdur 30 mg QD.  Mixed hyperlipidemia - Tereso Newcomer, [place] 03/05/2011 1:29 PM Signed  Lab Results   Component  Value  Date    ALT  89*  12/27/2010    Will check LFTs today. If ok, start Pravastatin 40 mg QHS. I asked him to wait to start until he hears from [place] regarding his LFTs. Check follow up FLP and LFTs in 6 weeks.   Essential hypertension, benign - Tereso Newcomer, [place] 03/05/2011 1:29 PM Addendum  Improved. Add Imdur as noted. Renal arterial dopplers pending.  Previous Version  Palpitations - Tereso Newcomer, [place] 03/05/2011 1:31 PM Signed  He notes palpitations as part of his symptoms that prompted his recent visit to the ED. He did have post op AFib. May need to consider event monitor if these symptoms continue.   Diabetes mellitus - Tereso Newcomer, [place] 03/05/2011 1:32 PM Signed  Hold metformin 24 hrs before and 48 hrs after cath.  Signed, Tereso Newcomer, PA-C  1:42 PM 03/05/2011

## 2011-03-05 NOTE — Progress Notes (Signed)
[address] [place], [place]  [address] Phone: [phone] Fax:  [phone]  Date:  03/05/2011   Name:  Keith Flores       DOB:  01-10-1961 MRN:  [id number]  PCP:  Dr. Mikeal Hawthorne  Primary Cardiologist:  Dr. [name]  Primary Electrophysiologist:  None    History of Present Illness: Keith Flores is a 51 y.o. male who presents for follow up on an abnormal stress test.    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  He had previously undergone cardiac catheterization in [place] 10/12.  Postop course was complicated by atrial fibrillation treated with amiodarone.  Other history includes hypertension, hyperlipidemia and diabetes.  He was admitted last month with chest pain.  CEs were essentially normal.  Chest CT demonstrated no pulmonary embolus, postoperative appearance following CABG and small left pleural effusion and dependent atelectasis in the left lung.  He had very high BP and his symptoms improved with better BP control.    I saw him in follow up 2/5.  He continued to note chest pain with exertion.  I discussed this with Dr. [name] and we decided to set him up for a stress myoview.  Echo was done and demonstrated mild LVH, EF 55%, grade 2 diast dysfxn, mild dilated asc aorta at 3.8 cm.  Myoview done yesterday demonstrates mod Inf ischemia and scar + ischemia in inf-lat wall with EF 57%.  He went to the ED 2 nights ago with chest pain.  Troponins were neg.  CXR was unremarkable.  K+ was 3.2.  He notes he ate some jalapeno pepper prior to chest pain beginning.  He denies pain today.  No exertional symptoms.  No dyspnea, orthopnea, PND or edema.  No syncope.    Past Medical History  Diagnosis Date  . Hypertension   . Diabetes mellitus   . Asthma   . Hyperlipidemia   . Coronary artery disease     CABG 12/12 (LIMA-LAD, SVG-DX, SVG-OM, SVG-PDA) c/b post op AFib Rx with amio Rx  . Chest pain     01/2011  in setting of HTN urgency: Chest CT demonstrated no pulmonary embolus, postoperative appearance following CABG and small left pleural effusion and dependent atelectasis in the left lung    Current Outpatient Prescriptions  Medication Sig Dispense Refill  . amLODipine (NORVASC) 5 MG tablet Take 5 mg by mouth daily.      Marland Kitchen aspirin 325 MG tablet Take 1 tablet (325 mg total) by mouth daily.      . enalapril (VASOTEC) 10 MG tablet Take 1 tablet (10 mg total) by mouth 2 (two) times daily.  30 tablet  3  . hydrochlorothiazide (HYDRODIURIL) 25 MG tablet Take 0.5 tablets (12.5 mg total) by mouth daily.  30 tablet  3  . metFORMIN (GLUCOPHAGE) 1000 MG tablet Take 1 tablet (1,000 mg total) by mouth daily. One time daily after lunch  30 tablet  2  . metoprolol (LOPRESSOR) 100 MG tablet Take 100 mg by mouth 2 (two) times daily.      . nitroGLYCERIN (NITROSTAT) 0.4 MG SL tablet Place 0.4 mg under the tongue every 5 (five) minutes x 3 doses as needed. For chest pain        Allergies: No Known Allergies  History  Substance Use Topics  . Smoking status: Former Smoker    Quit date: 10/21/2010  . Smokeless tobacco: Never Used  . Alcohol Use: No  Family History  Problem Relation Age of Onset  . Anesthesia problems Neg Hx   . Hypotension Neg Hx   . Malignant hyperthermia Neg Hx   . Pseudochol deficiency Neg Hx      ROS:  Please see the history of present illness.    All other systems reviewed and negative.   PHYSICAL EXAM: VS:  BP 143/91  Pulse 78  Ht 5\' 7"  (1.702 m)  Wt 204 lb (92.534 kg)  BMI 31.95 kg/m2 Well nourished, well developed, in no acute distress HEENT: normal Neck: no JVD Cardiac:  normal S1, S2; RRR; no murmur Lungs:  clear to auscultation bilaterally, no wheezing, rhonchi or rales Abd: soft, nontender, no hepatomegaly Ext: no edema Skin: warm and dry Neuro:  CNs 2-12 intact, no focal abnormalities noted  EKG:  NSR, HR 78, normal axis, NSSTTW changes, similar to prior  tracings  ASSESSMENT AND PLAN:

## 2011-03-05 NOTE — H&P (Signed)
Agree with plan as outlined by Mr Keith Flores. Pt has had multiple ER and office evaluations for chest pain following CABG. Myoview shows moderate inferior ischemia. Recommend cardiac catheterization and possible PCI. Pt understands risks, indication, and alternatives and agrees to proceed. [name] 03/05/2011 11:22 PM

## 2011-03-05 NOTE — Assessment & Plan Note (Signed)
I gave him a note today to remain out of work until March (3 mos post CABG).  Continue ASA.  I will also start Imdur 30 mg QD.

## 2011-03-05 NOTE — Assessment & Plan Note (Signed)
He notes palpitations as part of his symptoms that prompted his recent visit to the ED.  He did have post op AFib.  May need to consider event monitor if these symptoms continue.

## 2011-03-05 NOTE — Assessment & Plan Note (Addendum)
Improved.  Add Imdur as noted.  Renal arterial dopplers pending.

## 2011-03-05 NOTE — Assessment & Plan Note (Signed)
He is s/p CABG 7 weeks ago.  The likelihood he has lost one or more of his grafts is low.  But now he has an abnormal myoview with inferior ischemia.  Dr. [name] asked me to see him today to arrange LHC.  Risks and benefits of cardiac catheterization have been discussed with the patient.  These include bleeding, infection, kidney damage, stroke, heart attack, death.  The patient understands these risks and is willing to proceed.

## 2011-03-05 NOTE — Assessment & Plan Note (Signed)
Hold metformin 24 hrs before and 48 hrs after cath.

## 2011-03-05 NOTE — Patient Instructions (Signed)
Your physician recommends that you schedule a follow-up appointment in: after your procedure Your physician recommends that you return for lab work in: today and in 6 weeks Your physician has recommended you make the following change in your medication: START Isosorbide 30 mg daily and Pravastatin 40 mg at bedtime   Your physician has requested that you have a cardiac catheterization. Cardiac catheterization is used to diagnose and/or treat various heart conditions. Doctors may recommend this procedure for a number of different reasons. The most common reason is to evaluate chest pain. Chest pain can be a symptom of coronary artery disease (CAD), and cardiac catheterization can show whether plaque is narrowing or blocking your heart's arteries. This procedure is also used to evaluate the valves, as well as measure the blood flow and oxygen levels in different parts of your heart. For further information please visit [email or web address] Please follow instruction sheet, as given.

## 2011-03-05 NOTE — Assessment & Plan Note (Signed)
Lab Results  Component Value Date   ALT 89* 12/27/2010    Will check LFTs today.  If ok, start Pravastatin 40 mg QHS.  I asked him to wait to start until he hears from [place] regarding his LFTs.  Check follow up FLP and LFTs in 6 weeks.

## 2011-03-06 ENCOUNTER — Other Ambulatory Visit: Payer: Self-pay | Admitting: Physician Assistant

## 2011-03-06 ENCOUNTER — Telehealth: Payer: Self-pay | Admitting: *Deleted

## 2011-03-06 DIAGNOSIS — D582 Other hemoglobinopathies: Secondary | ICD-10-CM

## 2011-03-06 MED ORDER — PRAVASTATIN SODIUM 40 MG PO TABS: 40.0000 mg | ORAL_TABLET | Freq: Every evening | ORAL | Status: DC

## 2011-03-06 NOTE — Telephone Encounter (Signed)
rx sent in today for pravastatin 40 mg qhs # 30 x 6. Pt aware lab results and will be referred to Hematology and that our office will call pt when he has been scheduled. Danielle Rankin

## 2011-03-06 NOTE — Telephone Encounter (Signed)
Message copied by Tarri Fuller on Thu Mar 06, 2011  2:14 PM ------      Message from: [place], [place] T      Created: Thu Mar 06, 2011 11:21 AM       Ok to start Pravastatin            Hgb is very high.      He is not a smoker.      Refer to hematology      Tereso Newcomer, PA-C  11:20 AM 03/06/2011

## 2011-03-07 ENCOUNTER — Encounter (HOSPITAL_COMMUNITY)
Admission: RE | Disposition: A | Discharge status: Home / Self Care | Payer: Self-pay | Source: Ambulatory Visit | Attending: Cardiovascular Disease

## 2011-03-07 ENCOUNTER — Other Ambulatory Visit: Payer: Self-pay

## 2011-03-07 ENCOUNTER — Ambulatory Visit (HOSPITAL_COMMUNITY)
Admission: RE | Admit: 2011-03-07 | Discharge: 2011-03-08 | Disposition: A | Discharge status: Home / Self Care | Payer: Medicaid Other | Source: Ambulatory Visit | Attending: Cardiovascular Disease | Admitting: Cardiovascular Disease

## 2011-03-07 DIAGNOSIS — I2510 Atherosclerotic heart disease of native coronary artery without angina pectoris: Secondary | ICD-10-CM | POA: Insufficient documentation

## 2011-03-07 DIAGNOSIS — E119 Type 2 diabetes mellitus without complications: Secondary | ICD-10-CM | POA: Insufficient documentation

## 2011-03-07 DIAGNOSIS — R9439 Abnormal result of other cardiovascular function study: Secondary | ICD-10-CM | POA: Insufficient documentation

## 2011-03-07 DIAGNOSIS — E785 Hyperlipidemia, unspecified: Secondary | ICD-10-CM | POA: Insufficient documentation

## 2011-03-07 DIAGNOSIS — I200 Unstable angina: Secondary | ICD-10-CM | POA: Insufficient documentation

## 2011-03-07 DIAGNOSIS — Z951 Presence of aortocoronary bypass graft: Secondary | ICD-10-CM

## 2011-03-07 DIAGNOSIS — I25810 Atherosclerosis of coronary artery bypass graft(s) without angina pectoris: Secondary | ICD-10-CM | POA: Insufficient documentation

## 2011-03-07 DIAGNOSIS — R079 Chest pain, unspecified: Secondary | ICD-10-CM

## 2011-03-07 DIAGNOSIS — R0602 Shortness of breath: Secondary | ICD-10-CM

## 2011-03-07 DIAGNOSIS — I10 Essential (primary) hypertension: Secondary | ICD-10-CM | POA: Insufficient documentation

## 2011-03-07 HISTORY — DX: Other hemoglobinopathies: D58.2

## 2011-03-07 HISTORY — PX: LEFT HEART CATHETERIZATION WITH CORONARY/GRAFT ANGIOGRAM: SHX5450

## 2011-03-07 HISTORY — PX: PERCUTANEOUS CORONARY STENT INTERVENTION (PCI-S): SHX5485

## 2011-03-07 LAB — GLUCOSE, CAPILLARY
Glucose-Capillary: 106 mg/dL — ABNORMAL HIGH (ref 70–99)
Glucose-Capillary: 158 mg/dL — ABNORMAL HIGH (ref 70–99)
Glucose-Capillary: 155 mg/dL — ABNORMAL HIGH (ref 70–99)

## 2011-03-07 LAB — POCT ACTIVATED CLOTTING TIME: Activated Clotting Time: 342 seconds

## 2011-03-07 SURGERY — LEFT HEART CATHETERIZATION WITH CORONARY/GRAFT ANGIOGRAM

## 2011-03-07 MED ORDER — SODIUM CHLORIDE 0.9 % IV SOLN: 250.0000 mL | INTRAVENOUS | Status: DC | PRN

## 2011-03-07 MED ORDER — MIDAZOLAM HCL 2 MG/2ML IJ SOLN
INTRAMUSCULAR | Status: AC
  Filled 2011-03-07: qty 2

## 2011-03-07 MED ORDER — BIVALIRUDIN 250 MG IV SOLR
INTRAVENOUS | Status: AC
  Filled 2011-03-07: qty 250

## 2011-03-07 MED ORDER — HYDROCHLOROTHIAZIDE 25 MG PO TABS
12.5000 mg | ORAL_TABLET | Freq: Every day | ORAL | Status: DC
  Filled 2011-03-07: qty 0.5

## 2011-03-07 MED ORDER — NITROGLYCERIN 0.2 MG/ML ON CALL CATH LAB
INTRAVENOUS | Status: AC
  Filled 2011-03-07: qty 1

## 2011-03-07 MED ORDER — SODIUM CHLORIDE 0.9 % IV SOLN: 1.0000 mL/kg/h | INTRAVENOUS | Status: AC

## 2011-03-07 MED ORDER — SODIUM CHLORIDE 0.9 % IJ SOLN: 3.0000 mL | INTRAMUSCULAR | Status: DC | PRN

## 2011-03-07 MED ORDER — ENALAPRIL MALEATE 10 MG PO TABS
10.0000 mg | ORAL_TABLET | Freq: Two times a day (BID) | ORAL | Status: DC
  Administered 2011-03-07 – 2011-03-08 (×2): 10 mg via ORAL
  Filled 2011-03-07 (×3): qty 1

## 2011-03-07 MED ORDER — NITROGLYCERIN 0.4 MG SL SUBL: 0.4000 mg | SUBLINGUAL_TABLET | SUBLINGUAL | Status: DC | PRN

## 2011-03-07 MED ORDER — TICAGRELOR 90 MG PO TABS
90.0000 mg | ORAL_TABLET | Freq: Two times a day (BID) | ORAL | Status: DC
  Administered 2011-03-07 – 2011-03-08 (×2): 90 mg via ORAL
  Filled 2011-03-07 (×3): qty 1

## 2011-03-07 MED ORDER — ASPIRIN 81 MG PO CHEW
81.0000 mg | CHEWABLE_TABLET | Freq: Every day | ORAL | Status: DC
  Administered 2011-03-08: 81 mg via ORAL
  Filled 2011-03-07: qty 1

## 2011-03-07 MED ORDER — OXYCODONE-ACETAMINOPHEN 5-325 MG PO TABS
1.0000 | ORAL_TABLET | ORAL | Status: DC | PRN
  Administered 2011-03-07: 2 via ORAL
  Filled 2011-03-07: qty 2

## 2011-03-07 MED ORDER — METOPROLOL TARTRATE 100 MG PO TABS
100.0000 mg | ORAL_TABLET | Freq: Two times a day (BID) | ORAL | Status: DC
  Administered 2011-03-07 – 2011-03-08 (×2): 100 mg via ORAL
  Filled 2011-03-07 (×3): qty 1

## 2011-03-07 MED ORDER — ACETAMINOPHEN 325 MG PO TABS: 650.0000 mg | ORAL_TABLET | ORAL | Status: DC | PRN

## 2011-03-07 MED ORDER — HEPARIN (PORCINE) IN NACL 2-0.9 UNIT/ML-% IJ SOLN
INTRAMUSCULAR | Status: AC
  Filled 2011-03-07: qty 2000

## 2011-03-07 MED ORDER — ASPIRIN 81 MG PO CHEW
324.0000 mg | CHEWABLE_TABLET | ORAL | Status: AC
  Administered 2011-03-07: 324 mg via ORAL
  Filled 2011-03-07: qty 4

## 2011-03-07 MED ORDER — TICAGRELOR 90 MG PO TABS
ORAL_TABLET | ORAL | Status: AC
  Filled 2011-03-07: qty 2

## 2011-03-07 MED ORDER — SODIUM CHLORIDE 0.9 % IJ SOLN: 3.0000 mL | Freq: Two times a day (BID) | INTRAMUSCULAR | Status: DC

## 2011-03-07 MED ORDER — LIDOCAINE HCL (PF) 1 % IJ SOLN
INTRAMUSCULAR | Status: AC
  Filled 2011-03-07: qty 30

## 2011-03-07 MED ORDER — SIMVASTATIN 20 MG PO TABS
20.0000 mg | ORAL_TABLET | Freq: Every day | ORAL | Status: DC
  Administered 2011-03-07: 20 mg via ORAL
  Filled 2011-03-07 (×2): qty 1

## 2011-03-07 MED ORDER — FENTANYL CITRATE 0.05 MG/ML IJ SOLN
INTRAMUSCULAR | Status: AC
  Filled 2011-03-07: qty 2

## 2011-03-07 MED ORDER — AMLODIPINE BESYLATE 5 MG PO TABS
5.0000 mg | ORAL_TABLET | Freq: Every day | ORAL | Status: DC
  Administered 2011-03-08: 5 mg via ORAL
  Filled 2011-03-07: qty 1

## 2011-03-07 MED ORDER — SODIUM CHLORIDE 0.9 % IV SOLN
INTRAVENOUS | Status: DC
  Administered 2011-03-07: 1000 mL via INTRAVENOUS

## 2011-03-07 MED ORDER — ONDANSETRON HCL 4 MG/2ML IJ SOLN: 4.0000 mg | Freq: Four times a day (QID) | INTRAMUSCULAR | Status: DC | PRN

## 2011-03-07 MED ORDER — ISOSORBIDE MONONITRATE ER 30 MG PO TB24
30.0000 mg | ORAL_TABLET | Freq: Every day | ORAL | Status: DC
  Administered 2011-03-08: 30 mg via ORAL
  Filled 2011-03-07: qty 1

## 2011-03-07 NOTE — Op Note (Signed)
Cardiac Catheterization Procedure Note  Name: Keith Flores MRN: [id number] DOB: 05/22/1960  Procedure: Left Heart Cath, Selective Coronary Angiography, LV angiography,  PTCA/Stent of the right coronary artery, Perclose of the right femoral artery.  Indication: Progressive angina following bypass surgery with moderate ischemia in the inferior wall on a nuclear study   Diagnostic Procedure Details: The right groin was prepped, draped, and anesthetized with 1% lidocaine. Using the modified Seldinger technique, a 5 French sheath was introduced into the right femoral artery. Standard Judkins catheters were used for selective coronary angiography and left ventriculography. Saphenous vein graft and LIMA angiography were performed. Catheter exchanges were performed over a wire.  The diagnostic procedure was well-tolerated without immediate complications.  PROCEDURAL FINDINGS Hemodynamics: AO 135/81 LV 133/9  Coronary angiography: Coronary dominance: right  Left mainstem: The left main stem is patent with mild disease but no significant obstruction is noted  Left anterior descending (LAD): The LAD has a 75-80% stenosis at the first diagonal origin. There is segmental severe disease of the second diagonal. There is a 90% mid LAD stenosis. The distal LAD fills competitively from the mammary artery.  Left circumflex (LCx): The left circumflex is severely diseased. The first OM is totally occluded. There is a long diffuse stenosis in the second OM branch of 80%. There segmental stenosis in the branch vessels of the second obtuse marginal.  Right coronary artery (RCA): The right coronary artery is severely diseased. There is mild 40-50% proximal stenosis. The entire mid and distal vessel is severely diseased with subtotal occlusion and diffuse 90% stenosis with a focal 99% stenosis in the midsection. The distal vessel fills slowly and there is competitive filling from left to right  collaterals.  Saphenous vein graft to PDA: Total occlusion, aorto ostial.  Saphenous vein graft obtuse marginal. There is mild proximal stenosis. The remaining portion of the graft appear widely patent. The distal anastomotic site is widely patent. Multiple OM subbranch is filled from this graft.  Saphenous vein graft to first diagonal call this is a large graft with mild to moderate proximal body stenosis. The diagonal target vessel is diffusely diseased throughout. Some of the LAD fills retrograde from flow from this graft.  LIMA LAD: The graft is patent. Just beyond coronary anastomotic site the LAD appears totally occluded there are multiple collaterals arising from septal perforators supplying flow to the right PDA branch.  Left ventriculography: Left ventriculography was deferred.  PCI Procedure Note:  Following the diagnostic procedure, the decision was made to proceed with PCI. The sheath was upsized to a 6 [place]. Weight-based bivalirudin was given for anticoagulation. The patient was preloaded with 180 mg Brilinta. Once a therapeutic ACT was achieved, a 6 [place] JR-4 guide catheter was inserted.  A cougar coronary guidewire was used to cross the lesion.  The lesion was predilated with a 2.5x20 mm balloon.  There was severe diffuse disease and I was concerned that it would be difficult to pass stents into this lesion. I elected to predilated again with a 3.0 x 20 mm noncompliant balloon. The balloon would not cross the mid RCA. I then decided to use a guideliner to facilitate balloon and stent passage into the distal right coronary artery. The guidewire was advanced successfully and the entire segment of disease throughout the mid and distal RCA was predilated with a 3.0 x 20 mm noncompliant balloon. Both lesions were then stented with overlapping drug-eluting stents. In the distal RCA, I used a 3.5 x 32 mm Promus element  stent. In the mid vessel I used a 3.5 x 24 mm Promus element stent. Both  stents were postdilated with a 4.0 x 20 mm Brooks Quantum Apex noncompliant balloon. There was an excellent angiographic result at the completion of the procedure was 0% residual stenosis and TIMI-3 flow into the PDA branch. The patient tolerated the procedure well. There were no immediate complications. Femoral hemostasis was achieved with a Perclose device.   PCI Data: Vessel - RCA/Segment - Mid and Distal Percent Stenosis (pre)  99 TIMI-flow 2 Stent 3.5 x 32 and 3.5 x 24 Promus Element DES Percent Stenosis (post) 0 TIMI-flow (post) 3  Final Conclusions:   1. Severe native three-vessel coronary artery disease 2. Continued patency of the LIMA to LAD, saphenous vein graft to obtuse marginal, and saphenous vein graft to first diagonal. 3. Total occlusion of the saphenous vein graft to right PDA. 4. Successful PCI of the right coronary artery utilizing overlapping drug-eluting stents in the mid and distal vessel.  Recommendations: Dual antiplatelet therapy for a minimum of 12 months. Brilinta was chosen because the patient has extensive diffuse coronary disease with diabetes and is at continued risk for progressive atheroembolic events.  [name] 03/07/2011, 1:13 PM

## 2011-03-07 NOTE — Progress Notes (Signed)
Pt pain-free. No complaints tonight. Stable following PCI. Plan discharge tomorrow am. Needs case manager consult for assistance with Brilinta and will need counseling about meds.

## 2011-03-07 NOTE — Interval H&P Note (Signed)
History and Physical Interval Note:  03/07/2011 11:55 AM  Keith Flores  has presented today for surgery, with the diagnosis of Chest pain  The various methods of treatment have been discussed with the patient and family. After consideration of risks, benefits and other options for treatment, the patient has consented to  Procedure(s) (LRB): LEFT HEART CATHETERIZATION WITH CORONARY/GRAFT ANGIOGRAM () as a surgical intervention .  The patients' history has been reviewed, patient examined, no change in status, stable for surgery.  I have reviewed the patients' chart and labs.  Questions were answered to the patient's satisfaction.     [name]  Pt is well-known to me. I have reviewed his stress test results. He had recent CABG but has had multiple ER and office evaluations for chest pain since CABG. With significant ischemia on his nuclear scan we have decided to perform cardiac cath and possible PCI.   [name] 03/07/2011 11:56 AM

## 2011-03-08 ENCOUNTER — Other Ambulatory Visit: Payer: Self-pay

## 2011-03-08 ENCOUNTER — Encounter (HOSPITAL_COMMUNITY): Payer: Self-pay | Admitting: Physician Assistant

## 2011-03-08 DIAGNOSIS — I2510 Atherosclerotic heart disease of native coronary artery without angina pectoris: Secondary | ICD-10-CM

## 2011-03-08 LAB — BASIC METABOLIC PANEL
BUN: 17 mg/dL (ref 6–23)
Chloride: 100 mEq/L (ref 96–112)
GFR calc Af Amer: >90 mL/min (ref >90)
Potassium: 3.3 mEq/L — ABNORMAL LOW (ref 3.5–5.1)
Sodium: 138 mEq/L (ref 135–145)

## 2011-03-08 LAB — CBC
HCT: 52.1 % — ABNORMAL HIGH (ref 39.0–52.0)
RDW: 13.6 % (ref 11.5–15.5)
WBC: 7.6 10*3/uL (ref 4.0–10.5)

## 2011-03-08 LAB — GLUCOSE, CAPILLARY: Glucose-Capillary: 150 mg/dL — ABNORMAL HIGH (ref 70–99)

## 2011-03-08 MED ORDER — POTASSIUM CHLORIDE CRYS ER 20 MEQ PO TBCR: 20.0000 meq | EXTENDED_RELEASE_TABLET | Freq: Every day | ORAL | Status: DC

## 2011-03-08 MED ORDER — ASPIRIN 81 MG PO TABS: 81.0000 mg | ORAL_TABLET | Freq: Every day | ORAL | Status: DC

## 2011-03-08 MED ORDER — METFORMIN HCL 1000 MG PO TABS: 1000.0000 mg | ORAL_TABLET | Freq: Every day | ORAL | Status: DC

## 2011-03-08 MED ORDER — TICAGRELOR 90 MG PO TABS: 90.0000 mg | ORAL_TABLET | Freq: Two times a day (BID) | ORAL | Status: DC

## 2011-03-08 MED ORDER — HYDROCHLOROTHIAZIDE 12.5 MG PO CAPS
12.5000 mg | ORAL_CAPSULE | Freq: Every day | ORAL | Status: DC
  Administered 2011-03-08: 12.5 mg via ORAL
  Filled 2011-03-08: qty 1

## 2011-03-08 MED ORDER — POTASSIUM CHLORIDE CRYS ER 20 MEQ PO TBCR
20.0000 meq | EXTENDED_RELEASE_TABLET | Freq: Every day | ORAL | Status: DC
  Administered 2011-03-08: 20 meq via ORAL

## 2011-03-08 NOTE — Progress Notes (Signed)
Subjective:  The patient is doing well after PCI of his native RCA yesterday.  No chest pain overnight.  Telemetry stable.  EKG stable. Potassium low, will add Kdur.  Objective:  Vital Signs in the last 24 hours: Temp:  [98.2 F (36.8 C)-99.1 F (37.3 C)] 98.7 F (37.1 C) (02/16 0700) Pulse Rate:  [78-95] 86  (02/16 0700) Resp:  [20-26] 20  (02/16 0700) BP: (132-155)/(80-91) 150/87 mmHg (02/16 0700) SpO2:  [93 %-96 %] 93 % (02/16 0700) Weight:  [205 lb 7.5 oz (93.2 kg)] 205 lb 7.5 oz (93.2 kg) (02/16 0512)  Intake/Output from previous day: 02/15 0701 - 02/16 0700 In: 600 [P.O.:600] Out: 900 [Urine:900] Intake/Output from this shift:       . amLODipine  5 mg Oral Daily  . aspirin  324 mg Oral Pre-Cath  . aspirin  81 mg Oral Daily  . bivalirudin      . enalapril  10 mg Oral BID  . fentaNYL      . heparin      . hydrochlorothiazide  12.5 mg Oral Daily  . isosorbide mononitrate  30 mg Oral Daily  . lidocaine      . metoprolol  100 mg Oral BID  . midazolam      . midazolam      . nitroGLYCERIN      . simvastatin  20 mg Oral q1800  . Ticagrelor      . Ticagrelor  90 mg Oral BID  . DISCONTD: sodium chloride  3 mL Intravenous Q12H      . sodium chloride 1 mL/kg/hr (03/07/11 2100)  . DISCONTD: sodium chloride 1,000 mL (03/07/11 0919)    Physical Exam: The patient appears to be in no distress.  Head and neck exam reveals that the pupils are equal and reactive.  The extraocular movements are full.  There is no scleral icterus.  Mouth and pharynx are benign.  No lymphadenopathy.  No carotid bruits.  The jugular venous pressure is normal.  Thyroid is not enlarged or tender.  Chest is clear to percussion and auscultation.  No rales or rhonchi.  Expansion of the chest is symmetrical.  Heart reveals no abnormal lift or heave.  First and second heart sounds are normal.  There is no murmur gallop rub or click.  The abdomen is soft and nontender.  Bowel sounds are  normoactive.  There is no hepatosplenomegaly or mass.  There are no abdominal bruits. Right groin okay. Extremities reveal no phlebitis or edema.  Pedal pulses are good.  There is no cyanosis or clubbing.  Neurologic exam is normal strength and no lateralizing weakness.  No sensory deficits.  Integument reveals no rash  Lab Results:  Basename 03/08/11 0400 03/05/11 1257  WBC 7.6 7.1  HGB 18.4* 18.3*  PLT 196 212.0    Basename 03/08/11 0400 03/05/11 1257  NA 138 137  K 3.3* 3.9  CL 100 98  CO2 23 29  GLUCOSE 130* 140*  BUN 17 19  CREATININE 0.90 1.1   No results found for this basename: TROPONINI:2,CK,MB:2 in the last 72 hours Hepatic Function Panel  Basename 03/05/11 1257  PROT 7.9  ALBUMIN 4.3  AST 24  ALT 43  ALKPHOS 74  BILITOT 1.1  BILIDIR 0.1  IBILI --   No results found for this basename: CHOL in the last 72 hours No results found for this basename: PROTIME in the last 72 hours  Imaging: Imaging results have been reviewed.  No  recent studies.  Cardiac Studies:  Assessment/Plan:  Patient Active Hospital Problem List: Successful PCI to RCA with overlapping DES.  Plan: Add Kdur Followup with Dr. Erlinda Hong in several weeks for OV and BMET.  I have placed consult for care manager re Brilinta.   LOS: 1 day    Cassell Clement 03/08/2011, 8:45 AM

## 2011-03-08 NOTE — Discharge Summary (Signed)
Discharge Summary   Patient ID: Keith Flores MRN: [id number], DOB/AGE: [birth year] 52 y.o. Admit date: 03/07/2011 D/C date:     03/08/2011   Primary Discharge Diagnoses:  1. CAD - s/p successful PCI of the right coronary artery utilizing overlapping drug-eluting stents in the mid and distal vessel (done following abnormal outpatient nuclear study) - s/p CABG 12/12 (LIMA-LAD, SVG-DX, SVG-OM, SVG-PDA)  Secondary Discharge Diagnoses:  1. Atrial fibrillation post-op from CABG treated with amiodarone 2. Mixed hyperlipidemia  3. Diabetes mellitus 4. Elevated hemoglobin  Hospital Course: 51 y/o M with hx of CAD s/p CABG who was seen [facility]/25/11 in the [facility] Cardiology office by Tereso Newcomer with chest pain with exertion. He discussed this with Dr. [name] and they decided to set him up for a stress myoview. Echo was done and demonstrated mild LVH, EF 55%, grade 2 diast dysfxn, mild dilated asc aorta at 3.8 cm. Myoview done 03/04/11 demonstrated mod Inf ischemia and scar + ischemia in inf-lat wall with EF 57%. This was discussed further with Dr. Excell Seltzer who recommended he undergo left heart cath, which was performed 03/07/11 demonstrating:  Final Conclusions:  1. Severe native three-vessel coronary artery disease  2. Continued patency of the LIMA to LAD, saphenous vein graft to obtuse marginal, and saphenous vein graft to first diagonal.  3. Total occlusion of the saphenous vein graft to right PDA.  4. Successful PCI of the right coronary artery utilizing overlapping drug-eluting stents in the mid and distal vessel.  The patient tolerated the procedure well.Dr. Excell Seltzer recommended dual antiplatelet therapy for a minimum of 12 months. Brilinta was chosen because the patient has extensive diffuse coronary disease with diabetes and is at continued risk for progressive atheroembolic events. K was low and was thus started on potassium supplementation. Of note, there was some question of whether he  was having palpitations as an outpatient and event monitor was suggested as a possibility by Tereso Newcomer. A note was sent in via Epic to [facility] to clarify this. Dr. Patty Sermons has seen and examined him and feels he is stable for discharge. We also recommended the patient f/u with PCP given consistently elevated H/H.  Discharge Vitals: Blood pressure 150/87, pulse 86, temperature 98.7 F (37.1 C), temperature source Oral, resp. rate 20, height 5\' 7"  (1.702 m), weight 205 lb 7.5 oz (93.2 kg), SpO2 93.00%.  Labs: Lab Results  Component Value Date   WBC 7.6 03/08/2011   HGB 18.4* 03/08/2011   HCT 52.1* 03/08/2011   MCV 83.5 03/08/2011   PLT 196 03/08/2011     Lab 03/08/11 0400 03/05/11 1257  NA 138 --  K 3.3* --  CL 100 --  CO2 23 --  BUN 17 --  CREATININE 0.90 --  CALCIUM 9.3 --  PROT -- 7.9  BILITOT -- 1.1  ALKPHOS -- 74  ALT -- 43  AST -- 24  GLUCOSE 130* --    Lab Results  Component Value Date   CHOL 142 02/18/2011   HDL 32* 02/18/2011   LDLCALC 88 02/18/2011   TRIG 109 02/18/2011   Diagnostic Studies/Procedures   1. Chest Portable 1 View 03/03/2011  *RADIOLOGY REPORT*  Clinical Data: Chest pain.  PORTABLE CHEST - 1 VIEW  Comparison: Two-view chest 02/17/2011.  Findings: The heart is enlarged.  No edema or effusions are present to suggest failure.  The visualized soft tissues and bony thorax are unremarkable.  The lungs are clear.  IMPRESSION: Stable cardiomegaly without failure.  Original Report Authenticated  By: CHRISTOPHER W. MATTERN, M.D.   2. Cardiac catheterization this admission, please see full report and above for summary.   Discharge Medications   Medication List  As of 03/08/2011  9:29 AM   TAKE these medications         amLODipine 5 MG tablet   Commonly known as: NORVASC   Take 5 mg by mouth daily.      aspirin 81 MG tablet   Take 1 tablet (81 mg total) by mouth daily.      enalapril 10 MG tablet   Commonly known as: VASOTEC   Take 1 tablet (10 mg total) by  mouth 2 (two) times daily.      hydrochlorothiazide 25 MG tablet   Commonly known as: HYDRODIURIL   Take 0.5 tablets (12.5 mg total) by mouth daily.      isosorbide mononitrate 30 MG 24 hr tablet   Commonly known as: IMDUR   Take 1 tablet (30 mg total) by mouth daily.      metFORMIN 1000 MG tablet   Commonly known as: GLUCOPHAGE   Take 1 tablet (1,000 mg total) by mouth daily. One time daily after lunch      metoprolol 100 MG tablet   Commonly known as: LOPRESSOR   Take 100 mg by mouth 2 (two) times daily.      nitroGLYCERIN 0.4 MG SL tablet   Commonly known as: NITROSTAT   Place 0.4 mg under the tongue every 5 (five) minutes as needed. For chest pain      potassium chloride SA 20 MEQ tablet   Commonly known as: K-DUR,KLOR-CON   Take 1 tablet (20 mEq total) by mouth daily.      pravastatin 40 MG tablet   Commonly known as: PRAVACHOL   Take 1 tablet (40 mg total) by mouth every evening.      Ticagrelor 90 MG Tabs tablet   Commonly known as: BRILINTA   Take 1 tablet (90 mg total) by mouth 2 (two) times daily.            Disposition   The patient will be discharged in stable condition to home. Discharge Orders    Future Appointments: Provider: Department: Dept Phone: Center:   03/10/2011 8:30 AM [facility]  None     Future Orders Please Complete By Expires   Diet - low sodium heart healthy      Comments:   Diabetic diet   Increase activity slowly      Comments:   No driving for 2 days. No lifting over 5 lbs for 1 week. No sexual activity for 1 week. Keep procedure site clean & dry. If you notice increased pain, swelling, bleeding or pus, call/return!  You may shower, but no soaking baths/hot tubs/pools for 1 week.     Discharge instructions      Comments:   Do not restart your Metformin until 03/10/11.  You may not return to work until cleared by your cardiologist.     Follow-up Information    Follow up with [name], MD. (Our office  will call you for appointment with labwork )    Contact information:   [address] [place] [address] [phone]       Follow up with Primary Care Provider. (Your hemoglobin level tends to run high (red blood cell count) - please see your primary doctor to determine if this needs any further evaluation)  Duration of Discharge Encounter: Greater than 30 minutes including physician and PA time.  Signed, Ronie Spies PA-C 03/08/2011, 9:29 AM

## 2011-03-08 NOTE — Progress Notes (Signed)
   CARE MANAGEMENT NOTE 03/08/2011  Patient:  Keith Flores,Keith Flores   Account Number:  [id number]  Date Initiated:  03/08/2011  Documentation initiated by:  [facility]  Subjective/Objective Assessment:   PCI of the right coronary artery, afib     Action/Plan:   Anticipated DC Date:  03/08/2011   Anticipated DC Plan:  HOME/SELF CARE      DC Planning Services  CM consult  Medication Assistance      Choice offered to / List presented to:             Status of service:  Completed, signed off Medicare Important Message given?   (If response is "NO", the following Medicare IM given date fields will be blank) Date Medicare IM given:   Date Additional Medicare IM given:    Discharge Disposition:  HOME/SELF CARE  Per UR Regulation:    Comments:  03/08/11 1020 Spoke to pt and he uses [facility]. His pharmacy does not have in stock. Pt requested NCM to contact [facility] on [address] They do have medication in stock. Provided pt with 30 day Brilinta card to take to [facility] for 30 day free supply and instructed to take his Rx for Brilinta to his pharmacy so they can run prescription to see if his Medicaid will cover. Isidoro Donning RN CCM Case Mgmt phone [phone]

## 2011-03-08 NOTE — Progress Notes (Signed)
CARDIAC REHAB PHASE I   PRE:  Rate/Rhythm: 87 SR  BP:  Supine:   Sitting: 150/87  Standing:    SaO2:   MODE:  Ambulation: 680 ft   POST:  Rate/Rhythem: 95 SR  BP:  Supine:   Sitting: 146/82  Standing:    SaO2:  0840-0950  Tolerated discharge eduction well without c/o of cp or SOB VS stable. Completed discharge education with pt. He agrees to [employer]. CRP in [place], will send referral.  Beatrix Fetters

## 2011-03-10 ENCOUNTER — Encounter: Payer: Self-pay | Admitting: Cardiology

## 2011-03-10 MED FILL — Dextrose Inj 5%: INTRAVENOUS | Qty: 50 | Status: AC

## 2011-03-11 ENCOUNTER — Encounter (HOSPITAL_COMMUNITY): Payer: Self-pay

## 2011-03-11 ENCOUNTER — Other Ambulatory Visit: Payer: Self-pay | Admitting: *Deleted

## 2011-03-11 MED ORDER — AMLODIPINE BESYLATE 5 MG PO TABS: ORAL_TABLET | ORAL | Status: DC

## 2011-03-12 ENCOUNTER — Encounter: Payer: Medicaid Other | Admitting: Cardiology

## 2011-03-12 ENCOUNTER — Other Ambulatory Visit: Payer: Self-pay | Admitting: *Deleted

## 2011-03-12 ENCOUNTER — Encounter (INDEPENDENT_AMBULATORY_CARE_PROVIDER_SITE_OTHER): Payer: Medicaid Other

## 2011-03-12 DIAGNOSIS — I10 Essential (primary) hypertension: Secondary | ICD-10-CM

## 2011-03-12 DIAGNOSIS — I2510 Atherosclerotic heart disease of native coronary artery without angina pectoris: Secondary | ICD-10-CM

## 2011-03-12 MED ORDER — AMLODIPINE BESYLATE 10 MG PO TABS: 10.0000 mg | ORAL_TABLET | Freq: Every day | ORAL | Status: DC

## 2011-03-24 ENCOUNTER — Encounter: Payer: Self-pay | Admitting: Physician Assistant

## 2011-03-24 ENCOUNTER — Ambulatory Visit (INDEPENDENT_AMBULATORY_CARE_PROVIDER_SITE_OTHER): Payer: Medicaid Other | Admitting: Physician Assistant

## 2011-03-24 ENCOUNTER — Other Ambulatory Visit: Payer: Medicaid Other

## 2011-03-24 VITALS — BP 146/88 | HR 71 | Ht 67.0 in | Wt 204.8 lb

## 2011-03-24 DIAGNOSIS — R0602 Shortness of breath: Secondary | ICD-10-CM

## 2011-03-24 DIAGNOSIS — D45 Polycythemia vera: Secondary | ICD-10-CM

## 2011-03-24 DIAGNOSIS — R42 Dizziness and giddiness: Secondary | ICD-10-CM

## 2011-03-24 DIAGNOSIS — I10 Essential (primary) hypertension: Secondary | ICD-10-CM

## 2011-03-24 DIAGNOSIS — M542 Cervicalgia: Secondary | ICD-10-CM

## 2011-03-24 DIAGNOSIS — I2510 Atherosclerotic heart disease of native coronary artery without angina pectoris: Secondary | ICD-10-CM

## 2011-03-24 DIAGNOSIS — D751 Secondary polycythemia: Secondary | ICD-10-CM

## 2011-03-24 LAB — BASIC METABOLIC PANEL
CO2: 30 mEq/L (ref 19–32)
Calcium: 9.6 mg/dL (ref 8.4–10.5)
Creatinine, Ser: 1.1 mg/dL (ref 0.4–1.5)
Glucose, Bld: 140 mg/dL — ABNORMAL HIGH (ref 70–99)

## 2011-03-24 MED ORDER — NITROGLYCERIN 0.4 MG SL SUBL: 0.4000 mg | SUBLINGUAL_TABLET | SUBLINGUAL | Status: DC | PRN

## 2011-03-24 MED ORDER — ISOSORBIDE MONONITRATE ER 30 MG PO TB24: 30.0000 mg | ORAL_TABLET | Freq: Every day | ORAL | Status: DC

## 2011-03-24 MED ORDER — PRAVASTATIN SODIUM 40 MG PO TABS: 40.0000 mg | ORAL_TABLET | Freq: Every evening | ORAL | Status: DC

## 2011-03-24 MED ORDER — TICAGRELOR 90 MG PO TABS: 90.0000 mg | ORAL_TABLET | Freq: Two times a day (BID) | ORAL | Status: DC

## 2011-03-24 MED ORDER — HYDROCHLOROTHIAZIDE 25 MG PO TABS: 12.5000 mg | ORAL_TABLET | Freq: Every day | ORAL | Status: DC

## 2011-03-24 MED ORDER — POTASSIUM CHLORIDE CRYS ER 20 MEQ PO TBCR: 20.0000 meq | EXTENDED_RELEASE_TABLET | Freq: Every day | ORAL | Status: DC

## 2011-03-24 MED ORDER — AMLODIPINE BESYLATE 10 MG PO TABS: 10.0000 mg | ORAL_TABLET | Freq: Every day | ORAL | Status: DC

## 2011-03-24 MED ORDER — ENALAPRIL MALEATE 10 MG PO TABS: 10.0000 mg | ORAL_TABLET | Freq: Two times a day (BID) | ORAL | Status: DC

## 2011-03-24 MED ORDER — METOPROLOL TARTRATE 100 MG PO TABS: 100.0000 mg | ORAL_TABLET | Freq: Two times a day (BID) | ORAL | Status: DC

## 2011-03-24 NOTE — Progress Notes (Signed)
[address] [place], [place]  [address] Phone: [phone] Fax:  [phone]  Date:  03/24/2011   Name:  Keith Flores       DOB:  [birth year] MRN:  [id number]  PCP:  Dr. Mikeal Hawthorne Primary Cardiologist:  Dr. [name]  Primary Electrophysiologist:  None    History of Present Illness: [name] is a 51 y.o. male who presents for post hospital follow up.  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 He had previously undergone cardiac catheterization in [place] 10/12. Postop course was complicated by atrial fibrillation treated with amiodarone. Other history includes hypertension, hyperlipidemia and diabetes. He had recurrent chest pain last month.  Echo was done and demonstrated mild LVH, EF 55%, grade 2 diast dysfxn, mild dilated asc aorta at 3.8 cm. Myoview done and demonstrated mod Inf ischemia and scar + ischemia in inf-lat wall with EF 57%.  LHC arranged 03/07/11:  LAD 75-80% and mid 90%, severe disease in the D2, OM1 occluded, OM2 80%, severe stenosis in the circumflex, proximal RCA 40-50%, mid 99%, diffuse RCA 90%, SVG-PDA occluded, SVG-OM patent, SVG-diagonal patent, LIMA-LAD patent.  PCI:  Promus DES x2 to the mid and distal RCA.   Labs: Hemoglobin 18.4, potassium 3.3, creatinine 0.9, ALT 43.  He denies any further chest discomfort.  He denies shortness of breath.  His chest is still somewhat sore from his surgery but this is improving.  He denies orthopnea, PND or edema.  He denies syncope.  He does note some neck pain that seems to radiate down his right arm.  He also notes a spinning sensation when he turns over in bed at night.  He has had this in the past.  Past Medical History  Diagnosis Date  . Hypertension   . Diabetes mellitus   . Asthma   . Hyperlipidemia   . Coronary artery disease     CABG 12/12 (LIMA-LAD, SVG-DX, SVG-OM, SVG-PDA) c/b post op AFib Rx with amio Rx. s/p successful PCI of the  right coronary artery utilizing overlapping drug-eluting stents in the mid and distal vessel (done after abnormal nuc)  . Chest pain     01/2011 in setting of HTN urgency: Chest CT demonstrated no pulmonary embolus, postoperative appearance following CABG and small left pleural effusion and dependent atelectasis in the left lung  . Elevated hemoglobin     Current Outpatient Prescriptions  Medication Sig Dispense Refill  . amLODipine (NORVASC) 10 MG tablet Take 5 mg by mouth daily.      Marland Kitchen aspirin 81 MG tablet Take 1 tablet (81 mg total) by mouth daily.      . enalapril (VASOTEC) 10 MG tablet Take 1 tablet (10 mg total) by mouth 2 (two) times daily.  30 tablet  3  . hydrochlorothiazide (HYDRODIURIL) 25 MG tablet Take 0.5 tablets (12.5 mg total) by mouth daily.  30 tablet  3  . isosorbide mononitrate (IMDUR) 30 MG 24 hr tablet Take 1 tablet (30 mg total) by mouth daily.  30 tablet  6  . metFORMIN (GLUCOPHAGE) 1000 MG tablet Take 1 tablet (1,000 mg total) by mouth daily. One time daily after lunch      . metoprolol (LOPRESSOR) 100 MG tablet Take 100 mg by mouth 2 (two) times daily.      . nitroGLYCERIN (NITROSTAT) 0.4 MG SL tablet Place 0.4 mg under the tongue every 5 (five) minutes as needed. For chest pain      .  potassium chloride SA (K-DUR,KLOR-CON) 20 MEQ tablet Take 1 tablet (20 mEq total) by mouth daily.  30 tablet  6  . pravastatin (PRAVACHOL) 40 MG tablet Take 1 tablet (40 mg total) by mouth every evening.  30 tablet  6  . Ticagrelor (BRILINTA) 90 MG TABS tablet Take 1 tablet (90 mg total) by mouth 2 (two) times daily.  60 tablet  6    Allergies: No Known Allergies  History  Substance Use Topics  . Smoking status: Former Smoker    Quit date: 10/21/2010  . Smokeless tobacco: Never Used  . Alcohol Use: No     ROS:  Please see the history of present illness.   All other systems reviewed and negative.   PHYSICAL EXAM: VS:  BP 146/88  Pulse 71  Ht 5\' 7"  (1.702 m)  Wt 204 lb 12.8  oz (92.897 kg)  BMI 32.08 kg/m2 Well nourished, well developed, in no acute distress HEENT: normal Neck: no JVD Cardiac:  normal S1, S2; RRR; no murmur Lungs:  clear to auscultation bilaterally, no wheezing, rhonchi or rales Abd: soft, nontender, no hepatomegaly Ext: no edema; right groin without hematoma or bruit  Skin: warm and dry Neuro:  CNs 2-12 intact, no focal abnormalities noted  EKG:  Sinus rhythm, heart rate 71, normal axis, T wave inversions in 3 and aVF otherwise nonspecific changes  ASSESSMENT AND PLAN:  1. Coronary atherosclerosis of native coronary artery  Doing well post PCI to his RCA.  We discussed the importance of dual antiplatelet therapy.  followup with Dr. Excell Seltzer in 4-6 weeks.   2. Essential hypertension, benign  Somewhat uncontrolled.  Increase amlodipine to 10 mg daily.  Potassium was low in the hospital.  Repeat basic metabolic panel today.   3. Dizziness  He is describing positional vertigo.  I have recommended he followup with his PCP for further evaluation and management.   4. Neck pain  I have recommended he followup with his PCP for further evaluation and management.   5. Polycythemia  Hematology referral pending.      SignedTereso Newcomer, PA-C  10:04 AM 03/24/2011

## 2011-03-24 NOTE — Patient Instructions (Addendum)
Your physician recommends that you schedule a follow-up appointment in: 4-6 weeks with Dr Excell Seltzer Your physician recommends that you have lab work drawn today (BMP) Please make appointment with your primary care Dr for neck pain and dizziness

## 2011-03-27 ENCOUNTER — Encounter (HOSPITAL_COMMUNITY)
Admission: RE | Admit: 2011-03-27 | Discharge: 2011-03-27 | Disposition: A | Discharge status: Home / Self Care | Payer: Medicaid Other | Source: Ambulatory Visit | Attending: Cardiovascular Disease | Admitting: Cardiovascular Disease

## 2011-03-27 ENCOUNTER — Encounter (HOSPITAL_COMMUNITY): Payer: Self-pay

## 2011-03-27 DIAGNOSIS — R079 Chest pain, unspecified: Secondary | ICD-10-CM | POA: Insufficient documentation

## 2011-03-27 DIAGNOSIS — Z5189 Encounter for other specified aftercare: Secondary | ICD-10-CM | POA: Insufficient documentation

## 2011-03-27 DIAGNOSIS — Z9861 Coronary angioplasty status: Secondary | ICD-10-CM | POA: Insufficient documentation

## 2011-03-27 DIAGNOSIS — J45909 Unspecified asthma, uncomplicated: Secondary | ICD-10-CM | POA: Insufficient documentation

## 2011-03-27 DIAGNOSIS — E119 Type 2 diabetes mellitus without complications: Secondary | ICD-10-CM | POA: Insufficient documentation

## 2011-03-27 DIAGNOSIS — R002 Palpitations: Secondary | ICD-10-CM | POA: Insufficient documentation

## 2011-03-27 DIAGNOSIS — I2510 Atherosclerotic heart disease of native coronary artery without angina pectoris: Secondary | ICD-10-CM | POA: Insufficient documentation

## 2011-03-27 DIAGNOSIS — E785 Hyperlipidemia, unspecified: Secondary | ICD-10-CM | POA: Insufficient documentation

## 2011-03-27 DIAGNOSIS — I10 Essential (primary) hypertension: Secondary | ICD-10-CM | POA: Insufficient documentation

## 2011-03-27 DIAGNOSIS — I4891 Unspecified atrial fibrillation: Secondary | ICD-10-CM | POA: Insufficient documentation

## 2011-03-27 DIAGNOSIS — Z951 Presence of aortocoronary bypass graft: Secondary | ICD-10-CM | POA: Insufficient documentation

## 2011-03-27 NOTE — Progress Notes (Signed)
Cardiac Rehab Medication Review by a Pharmacist  Does the patient  feel that his/her medications are working for him/her?  yes  Has the patient been experiencing any side effects to the medications prescribed?  yes  Does the patient measure his/her own blood pressure or blood glucose at home?  no   Does the patient have any problems obtaining medications due to transportation or finances?   no  Understanding of regimen: good Understanding of indications: good Potential of compliance: good    Pharmacist comments: Patient reports feeling dizzy upon standing or bending over & has pain down his right arm during the night.  Also, he states that Medicaid will not cover his Brilinta (ticagrelor).  Could Plavix or Prasugrel be options?  Keith Flores. Saul Fordyce, PharmD  03/27/2011 8:59 AM

## 2011-03-31 ENCOUNTER — Encounter (HOSPITAL_COMMUNITY)
Admission: RE | Admit: 2011-03-31 | Discharge: 2011-03-31 | Disposition: A | Discharge status: Home / Self Care | Payer: Medicaid Other | Source: Ambulatory Visit | Attending: Cardiovascular Disease | Admitting: Cardiovascular Disease

## 2011-03-31 LAB — GLUCOSE, CAPILLARY: Glucose-Capillary: 166 mg/dL — ABNORMAL HIGH (ref 70–99)

## 2011-03-31 NOTE — Progress Notes (Signed)
Pt started cardiac rehab today.  Pt tolerated light exercise without difficulty or complaints of chest pain. Telemetry rhythm Sinus with Twave inversion. Faxed today's ECG tracing to Dr Earmon Phoenix office for review to Tereso Newcomer PAC's attention.  [name] said that medicaid does not cover Brillanta and he has 10 days supply left.  Dr Earmon Phoenix office notified  Will continue to monitor the patient throughout  the program. .

## 2011-04-02 ENCOUNTER — Encounter (HOSPITAL_COMMUNITY)
Admission: RE | Admit: 2011-04-02 | Discharge: 2011-04-02 | Disposition: A | Discharge status: Home / Self Care | Payer: Medicaid Other | Source: Ambulatory Visit | Attending: Cardiovascular Disease | Admitting: Cardiovascular Disease

## 2011-04-02 ENCOUNTER — Telehealth: Payer: Self-pay

## 2011-04-02 NOTE — Telephone Encounter (Signed)
I spoke with the Keith Flores at Cardiac Rehab about the pt switching from Brilinta to Plavix.  I also spoke with the pt by phone with an interpretor at cardiac rehab.  I made the pt aware that he can finish his current supply of Brilinta.  When he completes the samples he needs to start Plavix 300mg  the first day and then go to Plavix 75mg  daily.  The pt would like a 90 day supply of Plavix sent to [facility].  I called the pharmacy and left a message to discontinue Brilinta and start plavix 75mg  daily.  I made the pt and interpretor aware that the pt has to remember to take 4 tablets the first day because I do not want this on the bottle every time he gets a refill.  Pt verbalized understanding.    He can switch to plavix when he runs out of samples. Should switch as follows. Take last dose of brilinta, then the following day should take plavix 300 mg x 1, then 75 mg daily thereafter. thx ----- Message ----- From: Keith Blitz, RN Sent: 03/31/2011 12:21 PM To: [name], MD Subject: pt med   [name] saw the pt today and Brilinta is not covered by Medicaid. He provided the pt with samples of Brilinta and wondered what medication the pt can be switched to at this time?

## 2011-04-02 NOTE — Progress Notes (Signed)
Keith Flores 51 y.o. male       Nutrition Screen                                                                    YES  NO Do you live in a nursing home?  X   Do you eat out more than 3 times/week?    X If yes, how many times per week do you eat out?  Do you have food allergies?   X If yes, what are you allergic to?  Have you gained or lost more than 10 lbs without trying?              X  If yes, how much weight have you lost and over what time period? 14 lbs gained or lost over 3 month  Do you want to lose weight?    X  If yes, what is a goal weight or amount of weight you would like to lose? 20 lb  Do you eat alone most of the time?   X   Do you eat less than 2 meals/day?  X If yes, how many meals do you eat?  Do you drink more than 3 alcohol drinks/day?  X If yes, how many drinks per day?  Are you having trouble with constipation? * X  If yes, what are you doing to help relieve constipation? none  Do you have financial difficulties with buying food?*   X    Are you experiencing regular nausea/ vomiting?*     X   Do you have a poor appetite? *                                        X   Do you have trouble chewing/swallowing? *   X    Pt with diagnoses of:  X CABG              X Stent/ PTCA X Dyslipidemia  / HDL< 40 / LDL>70 / High TG  X %  Body fat >goal / Body Mass Index >25 X HTN / BP >120/80 X DM/A1c >6 / CBG >126       Pt Risk Score   12       Diagnosis Risk Score  75       Total Risk Score   87                        X High Risk                Low Risk    HT: 67" Ht Readings from Last 1 Encounters:  03/27/11 5\' 7"  (1.702 m)    WT:   206.4 lb (93.8 kg) Wt Readings from Last 3 Encounters:  03/27/11 206 lb 12.7 oz (93.8 kg)  03/24/11 204 lb 12.8 oz (92.897 kg)  03/08/11 205 lb 7.5 oz (93.2 kg)     IBW 67.3 139%IBW BMI 32.4 34.2%body fat  Meds reviewed: Metformin, Hydrochlorothiazide, Potassium Chloride Past Medical History  Diagnosis Date  . Hypertension     . Diabetes mellitus   . Asthma   .  Hyperlipidemia   . Coronary artery disease     CABG 12/12 (LIMA-LAD, SVG-DX, SVG-OM, SVG-PDA) c/b post op AFib Rx with amio Rx. s/p successful PCI of the right coronary artery utilizing overlapping drug-eluting stents in the mid and distal vessel (done after abnormal nuc)  . Chest pain     01/2011 in setting of HTN urgency: Chest CT demonstrated no pulmonary embolus, postoperative appearance following CABG and small left pleural effusion and dependent atelectasis in the left lung  . Elevated hemoglobin         Activity level: Pt is moderately active    Wt goal: 182-194 lb ( 82.7-88.2 kg) Current tobacco use? No     Pt quit tobacco use on 10/21/10 Food/Drug Interaction? No       Labs:  Lipid Panel     Component Value Date/Time   CHOL 142 02/18/2011 0524   TRIG 109 02/18/2011 0524   HDL 32* 02/18/2011 0524   CHOLHDL 4.4 02/18/2011 0524   VLDL 22 02/18/2011 0524   LDLCALC 88 02/18/2011 0524   Lab Results  Component Value Date   HGBA1C 5.7* 02/18/2011   02/15/11 Glucose 140  LDL goal: < 70      DM and > 2:      HTN, HDL, > 51 yo male Estimated Daily Nutrition Needs for: ? wt loss  1450-1950 Kcal , Total Fat 40-50gm, Saturated Fat 11-15 gm, Trans Fat 1.4-1.9 gm,  Sodium less than 1500 mg, Grams of CHO 175-250

## 2011-04-02 NOTE — Progress Notes (Signed)
[name] talked with Lauren Dr Earmon Phoenix Nurse regarding Sandy Salaam via the interpreter. [name] reported having a problem with "Otitis Media" Dr Maxwell Caul office called patients primary care physician.  Spoke with the answering service.  They will contact the patient.

## 2011-04-04 ENCOUNTER — Encounter (HOSPITAL_COMMUNITY)
Admission: RE | Admit: 2011-04-04 | Discharge: 2011-04-04 | Disposition: A | Discharge status: Home / Self Care | Payer: Medicaid Other | Source: Ambulatory Visit | Attending: Cardiovascular Disease | Admitting: Cardiovascular Disease

## 2011-04-04 NOTE — Progress Notes (Signed)
Keith Flores 51 y.o. male Time Spent: 90 minutes Nutrition Note Spoke with pt via interpretor.  Nutrition Plan and Nutrition Survey reviewed with pt. Pt is following Step 2 of the Therapeutic Lifestyle Changes diet. Weight loss tips discussed. Per nutrition screen pt c/o constipation, which pt reports continues to be an issue for him.  Pt encouraged to increase dietary fiber slowly and increase fluid consumption. Pt reports food insecurity given pt out of work and no one in his household is working. Pt receiving $102 per month in food stamps. Pt's food pantry options discussed.  Nutrition Diagnosis   Food-and nutrition-related knowledge deficit related to lack of exposure to information as related to diagnosis of: ? CVD ? DM (A1c 5.7)   Obesity related to excessive energy intake as evidenced by a BMI of 32.4  Nutrition RX/ Estimated Daily Nutrition Needs for: wt loss  1450-1950 Kcal, 40-50 gm fat, 11-15 gm sat fat, 1.4-1.9 gm trans-fat, <1500 mg sodium , 175-250 gm CHO   Nutrition Intervention   Pt's individual nutrition plan including cholesterol goals reviewed with pt.   Benefits of adopting Therapeutic Lifestyle Changes discussed when Medficts reviewed.   Pt to attend the Portion Distortion class   Will provide pt translated copies of the Nutrition I, II, and Diabetes Blitz classes   Will provide pt translated copies of wt loss, DM, and High Fiber Nutrition Therapy handouts   Continue client-centered nutrition education by RD, as part of interdisciplinary care. Goal(s)   Pt to identify food quantities necessary to achieve: ? wt loss to a goal wt of 182-194 lb (82.7-88.2 kg) at graduation from cardiac rehab.  Monitor and Evaluate progress toward nutrition goal with team.

## 2011-04-07 ENCOUNTER — Encounter (HOSPITAL_COMMUNITY)
Admission: RE | Admit: 2011-04-07 | Discharge: 2011-04-07 | Disposition: A | Discharge status: Home / Self Care | Payer: Medicaid Other | Source: Ambulatory Visit | Attending: Cardiovascular Disease | Admitting: Cardiovascular Disease

## 2011-04-07 NOTE — Progress Notes (Signed)
Keith Flores 51 y.o. male Nutrition Follow-up  Time Spent: 60 minutes Spoke with Armandina Stammer [phone]), pt caseworker for Food Stamps. Pt informed Ms. Moore on 02/2011 re: medical status. Pt information to be updated in [facility] computer system. Pt to receive additional Food Stamp benefits and back benefits. Information from Ms. Moore given to pt via interpretor. The [facility] offers emergency assistance to pt with Food stamps contacted and an appt scheduled with the interpretor Tuesday 04/08/11 at 1:30 PM. Pt expressed understanding of the above via interpretor. Continue client-centered nutrition education by RD as part of interdisciplinary care.  Monitor and evaluate progress toward nutrition goal with team.

## 2011-04-07 NOTE — Progress Notes (Signed)
[name] did not exercise today.  Did homework with the help of the arabic interpreter.  [name] talked with the dietitian about financial concerns with food. Appointment made by the diettian for Mr Abelmedgid to go to the [facility] Pant[facility] tomorrow at 1:30 pm.

## 2011-04-07 NOTE — Progress Notes (Signed)
Reviewed home exercise with pt today with interpreter.  Pt plans to walk at home for exercise.  Reviewed THR, pulse (needs practice), RPE, sign and symptoms, NTG use, and when to call 911 or MD.  Pt voiced understanding. Fabio Pierce, MA, ACSM RCEP

## 2011-04-09 ENCOUNTER — Encounter (HOSPITAL_COMMUNITY)
Admission: RE | Admit: 2011-04-09 | Discharge: 2011-04-09 | Disposition: A | Discharge status: Home / Self Care | Payer: Medicaid Other | Source: Ambulatory Visit | Attending: Cardiovascular Disease | Admitting: Cardiovascular Disease

## 2011-04-09 LAB — GLUCOSE, CAPILLARY
Glucose-Capillary: 190 mg/dL — ABNORMAL HIGH (ref 70–99)
Glucose-Capillary: 100 mg/dL — ABNORMAL HIGH (ref 70–99)

## 2011-04-09 NOTE — Progress Notes (Signed)
Nutrition: Brief Note Spoke with pt briefly. Pt reports his appointment appt at the [facility] Food [facility] "went well" (e.g. Pt received food). Continue client-centered nutrition education by RD as part of interdisciplinary care.  Monitor and evaluate progress toward nutrition goal with team.

## 2011-04-11 ENCOUNTER — Encounter (HOSPITAL_COMMUNITY)
Admission: RE | Admit: 2011-04-11 | Discharge: 2011-04-11 | Disposition: A | Discharge status: Home / Self Care | Payer: Medicaid Other | Source: Ambulatory Visit | Attending: Cardiovascular Disease | Admitting: Cardiovascular Disease

## 2011-04-11 LAB — GLUCOSE, CAPILLARY: Glucose-Capillary: 128 mg/dL — ABNORMAL HIGH (ref 70–99)

## 2011-04-14 ENCOUNTER — Encounter (HOSPITAL_COMMUNITY)
Admission: RE | Admit: 2011-04-14 | Discharge: 2011-04-14 | Disposition: A | Discharge status: Home / Self Care | Payer: Medicaid Other | Source: Ambulatory Visit | Attending: Cardiovascular Disease | Admitting: Cardiovascular Disease

## 2011-04-14 LAB — GLUCOSE, CAPILLARY: Glucose-Capillary: 143 mg/dL — ABNORMAL HIGH (ref 70–99)

## 2011-04-15 ENCOUNTER — Other Ambulatory Visit: Payer: Self-pay | Admitting: *Deleted

## 2011-04-15 MED ORDER — METFORMIN HCL 1000 MG PO TABS: 1000.0000 mg | ORAL_TABLET | Freq: Every day | ORAL | Status: DC

## 2011-04-16 ENCOUNTER — Encounter (HOSPITAL_COMMUNITY)
Admission: RE | Admit: 2011-04-16 | Discharge: 2011-04-16 | Disposition: A | Discharge status: Home / Self Care | Payer: Medicaid Other | Source: Ambulatory Visit | Attending: Cardiovascular Disease | Admitting: Cardiovascular Disease

## 2011-04-16 LAB — GLUCOSE, CAPILLARY: Glucose-Capillary: 161 mg/dL — ABNORMAL HIGH (ref 70–99)

## 2011-04-16 NOTE — Progress Notes (Signed)
Nutrition Note Spoke with Armandina Stammer [phone]), pt caseworker for Food Stamps. Per discussion with Ms. Christell Constant, pt information is not updated in the computer system, but "pt should receive something." This writer reiterated the problem with pt only receiving approximately $100 dollars for the month for a family of 4. Ms. Christell Constant stated that the pt should receive "something extra" in his next check from 02/2010 and 03/2010.  Continue client-centered nutrition education by RD as part of interdisciplinary care.  Monitor and evaluate progress toward nutrition goal with team.

## 2011-04-18 ENCOUNTER — Encounter (HOSPITAL_COMMUNITY)
Admission: RE | Admit: 2011-04-18 | Discharge: 2011-04-18 | Disposition: A | Discharge status: Home / Self Care | Payer: Medicaid Other | Source: Ambulatory Visit | Attending: Cardiovascular Disease | Admitting: Cardiovascular Disease

## 2011-04-18 NOTE — Progress Notes (Signed)
Reviewed Madhats homework with an interpreter present. Keith Flores says he only has 5 days left of his metformin left.  Keith Flores on [employer] called on his behalf to request refills from his doctor's office. Keith Flores said he did not eat breakfast this morning.  CBG 119.  Encouraged the patient to eat breakfast prior to coming to exercise.  Patient given graham crackers and lemonade. No complaints with exercise today.

## 2011-04-21 ENCOUNTER — Encounter (HOSPITAL_COMMUNITY)
Admission: RE | Admit: 2011-04-21 | Discharge: 2011-04-21 | Disposition: A | Discharge status: Home / Self Care | Payer: Medicaid Other | Source: Ambulatory Visit | Attending: Cardiovascular Disease | Admitting: Cardiovascular Disease

## 2011-04-21 DIAGNOSIS — Z9861 Coronary angioplasty status: Secondary | ICD-10-CM | POA: Insufficient documentation

## 2011-04-21 DIAGNOSIS — R079 Chest pain, unspecified: Secondary | ICD-10-CM | POA: Insufficient documentation

## 2011-04-21 DIAGNOSIS — E119 Type 2 diabetes mellitus without complications: Secondary | ICD-10-CM | POA: Insufficient documentation

## 2011-04-21 DIAGNOSIS — I2510 Atherosclerotic heart disease of native coronary artery without angina pectoris: Secondary | ICD-10-CM | POA: Insufficient documentation

## 2011-04-21 DIAGNOSIS — J45909 Unspecified asthma, uncomplicated: Secondary | ICD-10-CM | POA: Insufficient documentation

## 2011-04-21 DIAGNOSIS — R002 Palpitations: Secondary | ICD-10-CM | POA: Insufficient documentation

## 2011-04-21 DIAGNOSIS — Z951 Presence of aortocoronary bypass graft: Secondary | ICD-10-CM | POA: Insufficient documentation

## 2011-04-21 DIAGNOSIS — E785 Hyperlipidemia, unspecified: Secondary | ICD-10-CM | POA: Insufficient documentation

## 2011-04-21 DIAGNOSIS — I4891 Unspecified atrial fibrillation: Secondary | ICD-10-CM | POA: Insufficient documentation

## 2011-04-21 DIAGNOSIS — Z5189 Encounter for other specified aftercare: Secondary | ICD-10-CM | POA: Insufficient documentation

## 2011-04-21 DIAGNOSIS — I10 Essential (primary) hypertension: Secondary | ICD-10-CM | POA: Insufficient documentation

## 2011-04-22 NOTE — Progress Notes (Signed)
Keith Flores has an appointment with Dr Excell Seltzer tomorrow.  Will send exercise flow sheets through epic Careers information officer.

## 2011-04-23 ENCOUNTER — Encounter (HOSPITAL_COMMUNITY)
Admission: RE | Admit: 2011-04-23 | Discharge: 2011-04-23 | Disposition: A | Discharge status: Home / Self Care | Payer: Medicaid Other | Source: Ambulatory Visit | Attending: Cardiovascular Disease | Admitting: Cardiovascular Disease

## 2011-04-23 ENCOUNTER — Ambulatory Visit (INDEPENDENT_AMBULATORY_CARE_PROVIDER_SITE_OTHER): Payer: Medicaid Other | Admitting: Cardiovascular Disease

## 2011-04-23 ENCOUNTER — Encounter: Payer: Self-pay | Admitting: Cardiovascular Disease

## 2011-04-23 VITALS — BP 120/80 | HR 84 | Ht 67.0 in | Wt 204.8 lb

## 2011-04-23 DIAGNOSIS — I2510 Atherosclerotic heart disease of native coronary artery without angina pectoris: Secondary | ICD-10-CM

## 2011-04-23 DIAGNOSIS — I10 Essential (primary) hypertension: Secondary | ICD-10-CM

## 2011-04-23 DIAGNOSIS — E782 Mixed hyperlipidemia: Secondary | ICD-10-CM

## 2011-04-23 DIAGNOSIS — E7800 Pure hypercholesterolemia, unspecified: Secondary | ICD-10-CM

## 2011-04-23 DIAGNOSIS — I25810 Atherosclerosis of coronary artery bypass graft(s) without angina pectoris: Secondary | ICD-10-CM

## 2011-04-23 MED ORDER — CLOPIDOGREL BISULFATE 75 MG PO TABS: 75.0000 mg | ORAL_TABLET | Freq: Every day | ORAL | Status: DC

## 2011-04-23 MED ORDER — METFORMIN HCL 1000 MG PO TABS: 1000.0000 mg | ORAL_TABLET | Freq: Every day | ORAL | Status: DC

## 2011-04-23 NOTE — Progress Notes (Signed)
HPI:  This is a 51 year old gentleman presenting for followup evaluation. He has a history of coronary artery disease status post CABG in 2012. The patient's postoperative course was complicated by atrial fibrillation which is now resolved. He had recurrent chest pain and underwent a nuclear scan that demonstrated inferior ischemia. The patient underwent repeat cardiac catheterization that showed occlusion of the vein graft to his right PDA. His native right coronary artery had severe diffuse disease and he underwent successful PCI using overlapping drug-eluting stents. He returns today for followup.  The patient reports compliance with his medications. He has some numbness in his chest wall and some chest pain when he lies on his left side. He denies exertional chest pain or pressure. He denies edema, palpitations, orthopnea, or PND. He otherwise feels well. He is planning a trip to [place] over the summer and he will be gone for 3 months.  Outpatient Encounter Prescriptions as of 04/23/2011  Medication Sig Dispense Refill  . amLODipine (NORVASC) 10 MG tablet Take 1 tablet (10 mg total) by mouth daily.  90 tablet  2  . aspirin 81 MG tablet Take 1 tablet (81 mg total) by mouth daily.      . clopidogrel (PLAVIX) 75 MG tablet Take 1 tablet (75 mg total) by mouth daily.  90 tablet  3  . enalapril (VASOTEC) 10 MG tablet Take 1 tablet (10 mg total) by mouth 2 (two) times daily.  90 tablet  2  . hydrochlorothiazide (HYDRODIURIL) 25 MG tablet Take 0.5 tablets (12.5 mg total) by mouth daily.  45 tablet  2  . metFORMIN (GLUCOPHAGE) 1000 MG tablet Take 1 tablet (1,000 mg total) by mouth daily. One time daily after lunch  90 tablet  2  . metoprolol (LOPRESSOR) 100 MG tablet Take 1 tablet (100 mg total) by mouth 2 (two) times daily.  180 tablet  2  . nitroGLYCERIN (NITROSTAT) 0.4 MG SL tablet Place 1 tablet (0.4 mg total) under the tongue every 5 (five) minutes as needed. For chest pain  25 tablet  6  . potassium  chloride SA (K-DUR,KLOR-CON) 20 MEQ tablet Take 1 tablet (20 mEq total) by mouth daily.  90 tablet  2  . pravastatin (PRAVACHOL) 40 MG tablet Take 1 tablet (40 mg total) by mouth every evening.  90 tablet  2  . DISCONTD: clopidogrel (PLAVIX) 75 MG tablet Take 1 tablet (75 mg total) by mouth daily.  1 tablet  0  . DISCONTD: isosorbide mononitrate (IMDUR) 30 MG 24 hr tablet Take 1 tablet (30 mg total) by mouth daily.  90 tablet  2  . DISCONTD: metFORMIN (GLUCOPHAGE) 1000 MG tablet Take 1 tablet (1,000 mg total) by mouth daily. One time daily after lunch  30 tablet  3    No Known Allergies  Past Medical History  Diagnosis Date  . Hypertension   . Diabetes mellitus   . Asthma   . Hyperlipidemia   . Coronary artery disease     CABG 12/12 (LIMA-LAD, SVG-DX, SVG-OM, SVG-PDA) c/b post op AFib Rx with amio Rx. s/p successful PCI of the right coronary artery utilizing overlapping drug-eluting stents in the mid and distal vessel (done after abnormal nuc)  . Chest pain     01/2011 in setting of HTN urgency: Chest CT demonstrated no pulmonary embolus, postoperative appearance following CABG and small left pleural effusion and dependent atelectasis in the left lung  . Elevated hemoglobin     ROS: Negative except as per HPI  BP 120/80  Pulse 84  Ht 5\' 7"  (1.702 m)  Wt 92.897 kg (204 lb 12.8 oz)  BMI 32.08 kg/m2  PHYSICAL EXAM: Pt is alert and oriented, NAD HEENT: normal Neck: JVP - normal, carotids 2+= without bruits Lungs: CTA bilaterally CV: RRR without murmur or gallop Abd: soft, NT, Positive BS, no hepatomegaly Ext: no C/C/E, distal pulses intact and equal Skin: warm/dry no rash  ASSESSMENT AND PLAN:

## 2011-04-23 NOTE — Patient Instructions (Signed)
Your physician recommends that you schedule a follow-up appointment in: MAY (need the last WEEK of May)  Your physician has recommended you make the following change in your medication: STOP Isosorbide Mononitrate  Your physician recommends that you return for a FASTING LIPID, LIVER and BMP one WEEK prior to your May appointment.

## 2011-04-23 NOTE — Progress Notes (Signed)
Nutrition Note Armandina Stammer, pt's food stamps case manager, called re: pt food stamps. Ms. Christell Constant stated that she needs pt hospital bills before pt can receive more money for food stamps. Spoke with pt. Pt c/o only receiving $100 this month. Per discussion via interpretor, pt made aware of the need to send copies of hospital bills to Ms. Moore. Pt had hospital bills in his car, brought them in and this writer faxed them to Ms. Christell Constant [phone]). Continue client-centered nutrition education by RD as part of interdisciplinary care.  Monitor and evaluate progress toward nutrition goal with team.

## 2011-04-23 NOTE — Assessment & Plan Note (Signed)
The patient is tolerating statin therapy. Will repeat lipids and LFTs before he leaves the summer. His last LDL was 88.

## 2011-04-23 NOTE — Assessment & Plan Note (Signed)
The patient is stable without angina. We discussed the continued importance of medication adherence. He should continue on aspirin and Plavix for a minimum of 12 months. He will need a long-term supply of Plavix before he leaves for [place]. He otherwise will continue his current medical program without changes.

## 2011-04-23 NOTE — Assessment & Plan Note (Signed)
Blood pressure well controlled on his current medical program. I'm going to discontinue Imdur since he is not having any angina.

## 2011-04-25 ENCOUNTER — Encounter (HOSPITAL_COMMUNITY)
Admission: RE | Admit: 2011-04-25 | Discharge: 2011-04-25 | Disposition: A | Discharge status: Home / Self Care | Payer: Medicaid Other | Source: Ambulatory Visit | Attending: Cardiovascular Disease | Admitting: Cardiovascular Disease

## 2011-04-25 NOTE — Progress Notes (Signed)
[name]'s  Isosorbide has been discontinued.  Will continue to monitor.

## 2011-04-28 ENCOUNTER — Encounter (HOSPITAL_COMMUNITY)
Admission: RE | Admit: 2011-04-28 | Discharge: 2011-04-28 | Disposition: A | Discharge status: Home / Self Care | Payer: Medicaid Other | Source: Ambulatory Visit | Attending: Cardiovascular Disease | Admitting: Cardiovascular Disease

## 2011-04-30 ENCOUNTER — Encounter (HOSPITAL_COMMUNITY)
Admission: RE | Admit: 2011-04-30 | Discharge: 2011-04-30 | Disposition: A | Discharge status: Home / Self Care | Payer: Medicaid Other | Source: Ambulatory Visit | Attending: Cardiovascular Disease | Admitting: Cardiovascular Disease

## 2011-04-30 NOTE — Progress Notes (Signed)
Keith Flores has a question about his plavix. Keith Flores says he has 5 tablets left in his current bottle. Will have an interpreter present to make sure the patients needs are met on Friday.

## 2011-05-02 ENCOUNTER — Encounter (HOSPITAL_COMMUNITY)
Admission: RE | Admit: 2011-05-02 | Discharge: 2011-05-02 | Disposition: A | Discharge status: Home / Self Care | Payer: Medicaid Other | Source: Ambulatory Visit | Attending: Cardiovascular Disease | Admitting: Cardiovascular Disease

## 2011-05-02 LAB — GLUCOSE, CAPILLARY: Glucose-Capillary: 144 mg/dL — ABNORMAL HIGH (ref 70–99)

## 2011-05-02 NOTE — Progress Notes (Signed)
The interpreter was present today.  Keith Flores says he is unable to get a 90 day supply of his generic plavix at the pharmacy he gets his medications filled at. Will check with Dr Earmon Phoenix office.

## 2011-05-05 ENCOUNTER — Encounter (HOSPITAL_COMMUNITY)
Admission: RE | Admit: 2011-05-05 | Discharge: 2011-05-05 | Disposition: A | Discharge status: Home / Self Care | Payer: Medicaid Other | Source: Ambulatory Visit | Attending: Cardiovascular Disease | Admitting: Cardiovascular Disease

## 2011-05-07 ENCOUNTER — Encounter (HOSPITAL_COMMUNITY)
Admission: RE | Admit: 2011-05-07 | Discharge: 2011-05-07 | Disposition: A | Discharge status: Home / Self Care | Payer: Medicaid Other | Source: Ambulatory Visit | Attending: Cardiovascular Disease | Admitting: Cardiovascular Disease

## 2011-05-09 ENCOUNTER — Encounter (HOSPITAL_COMMUNITY)
Admission: RE | Admit: 2011-05-09 | Discharge: 2011-05-09 | Disposition: A | Discharge status: Home / Self Care | Payer: Medicaid Other | Source: Ambulatory Visit | Attending: Cardiovascular Disease | Admitting: Cardiovascular Disease

## 2011-05-09 LAB — GLUCOSE, CAPILLARY: Glucose-Capillary: 125 mg/dL — ABNORMAL HIGH (ref 70–99)

## 2011-05-09 NOTE — Progress Notes (Signed)
Via the interpreter, medicaid called on patients behalf.  Patient is having difficulty getting his generic plavix refilled the patient has 2-3 tablets left.

## 2011-05-12 ENCOUNTER — Encounter (HOSPITAL_COMMUNITY)
Admission: RE | Admit: 2011-05-12 | Discharge: 2011-05-12 | Disposition: A | Discharge status: Home / Self Care | Payer: Medicaid Other | Source: Ambulatory Visit | Attending: Cardiovascular Disease | Admitting: Cardiovascular Disease

## 2011-05-12 ENCOUNTER — Telehealth: Payer: Self-pay | Admitting: Cardiovascular Disease

## 2011-05-12 NOTE — Telephone Encounter (Signed)
New problem:  Need sample of plavix. Patient having problem with medicade. Won't refill it.  Only have 2 pills left.

## 2011-05-13 ENCOUNTER — Telehealth (HOSPITAL_COMMUNITY): Payer: Self-pay | Admitting: *Deleted

## 2011-05-13 NOTE — Telephone Encounter (Signed)
I spoke with Keith Flores and made her aware that I have a bottle of Plavix in the office that the pt can pick-up.  This bottle has about 60 tablets.  She will have the pt pick-up samples tomorrow after cardiac rehab.

## 2011-05-13 NOTE — Telephone Encounter (Signed)
Follow up from previous call.  Per Byrd Hesselbach from cardiac rehab, checking on status -  sample of plavix.

## 2011-05-14 ENCOUNTER — Encounter (HOSPITAL_COMMUNITY)
Admission: RE | Admit: 2011-05-14 | Discharge: 2011-05-14 | Disposition: A | Discharge status: Home / Self Care | Payer: Medicaid Other | Source: Ambulatory Visit | Attending: Cardiovascular Disease | Admitting: Cardiovascular Disease

## 2011-05-15 NOTE — Progress Notes (Signed)
[name] stopped by Dr Earmon Phoenix office yesterday and picked up plavix samples.

## 2011-05-16 ENCOUNTER — Encounter (HOSPITAL_COMMUNITY)
Admission: RE | Admit: 2011-05-16 | Discharge: 2011-05-16 | Disposition: A | Discharge status: Home / Self Care | Payer: Medicaid Other | Source: Ambulatory Visit | Attending: Cardiovascular Disease | Admitting: Cardiovascular Disease

## 2011-05-19 ENCOUNTER — Encounter (HOSPITAL_COMMUNITY)
Admission: RE | Admit: 2011-05-19 | Discharge: 2011-05-19 | Disposition: A | Discharge status: Home / Self Care | Payer: Medicaid Other | Source: Ambulatory Visit | Attending: Cardiovascular Disease | Admitting: Cardiovascular Disease

## 2011-05-21 ENCOUNTER — Encounter (HOSPITAL_COMMUNITY)
Admission: RE | Admit: 2011-05-21 | Discharge: 2011-05-21 | Disposition: A | Discharge status: Home / Self Care | Payer: Medicaid Other | Source: Ambulatory Visit | Attending: Cardiovascular Disease | Admitting: Cardiovascular Disease

## 2011-05-21 DIAGNOSIS — J45909 Unspecified asthma, uncomplicated: Secondary | ICD-10-CM | POA: Insufficient documentation

## 2011-05-21 DIAGNOSIS — I10 Essential (primary) hypertension: Secondary | ICD-10-CM | POA: Insufficient documentation

## 2011-05-21 DIAGNOSIS — Z5189 Encounter for other specified aftercare: Secondary | ICD-10-CM | POA: Insufficient documentation

## 2011-05-21 DIAGNOSIS — E119 Type 2 diabetes mellitus without complications: Secondary | ICD-10-CM | POA: Insufficient documentation

## 2011-05-21 DIAGNOSIS — R079 Chest pain, unspecified: Secondary | ICD-10-CM | POA: Insufficient documentation

## 2011-05-21 DIAGNOSIS — E785 Hyperlipidemia, unspecified: Secondary | ICD-10-CM | POA: Insufficient documentation

## 2011-05-21 DIAGNOSIS — I2510 Atherosclerotic heart disease of native coronary artery without angina pectoris: Secondary | ICD-10-CM | POA: Insufficient documentation

## 2011-05-21 DIAGNOSIS — Z9861 Coronary angioplasty status: Secondary | ICD-10-CM | POA: Insufficient documentation

## 2011-05-21 DIAGNOSIS — R002 Palpitations: Secondary | ICD-10-CM | POA: Insufficient documentation

## 2011-05-21 DIAGNOSIS — I4891 Unspecified atrial fibrillation: Secondary | ICD-10-CM | POA: Insufficient documentation

## 2011-05-21 DIAGNOSIS — Z951 Presence of aortocoronary bypass graft: Secondary | ICD-10-CM | POA: Insufficient documentation

## 2011-05-23 ENCOUNTER — Encounter (HOSPITAL_COMMUNITY)
Admission: RE | Admit: 2011-05-23 | Discharge: 2011-05-23 | Disposition: A | Discharge status: Home / Self Care | Payer: Medicaid Other | Source: Ambulatory Visit | Attending: Cardiovascular Disease | Admitting: Cardiovascular Disease

## 2011-05-23 NOTE — Progress Notes (Signed)
Nutrition Note Per pt request, this writer called Armandina Stammer, pt's food stamps case manager, again re: pt food stamps. Pt continues to receive only $100 per month. After a lengthy conversation with Ms. Christell Constant [phone]) this writer was told "I'll have to look into it and get back with you." Continue client-centered nutrition education by RD as part of interdisciplinary care.  Monitor and evaluate progress toward nutrition goal with team.

## 2011-05-26 ENCOUNTER — Encounter (HOSPITAL_COMMUNITY)
Admission: RE | Admit: 2011-05-26 | Discharge: 2011-05-26 | Disposition: A | Discharge status: Home / Self Care | Payer: Medicaid Other | Source: Ambulatory Visit | Attending: Cardiovascular Disease | Admitting: Cardiovascular Disease

## 2011-05-28 ENCOUNTER — Encounter (HOSPITAL_COMMUNITY)
Admission: RE | Admit: 2011-05-28 | Discharge: 2011-05-28 | Disposition: A | Discharge status: Home / Self Care | Payer: Medicaid Other | Source: Ambulatory Visit | Attending: Cardiovascular Disease | Admitting: Cardiovascular Disease

## 2011-05-30 ENCOUNTER — Encounter (HOSPITAL_COMMUNITY)
Admission: RE | Admit: 2011-05-30 | Discharge: 2011-05-30 | Disposition: A | Discharge status: Home / Self Care | Payer: Medicaid Other | Source: Ambulatory Visit | Attending: Cardiovascular Disease | Admitting: Cardiovascular Disease

## 2011-06-02 ENCOUNTER — Encounter (HOSPITAL_COMMUNITY)
Admission: RE | Admit: 2011-06-02 | Discharge: 2011-06-02 | Disposition: A | Discharge status: Home / Self Care | Payer: Medicaid Other | Source: Ambulatory Visit | Attending: Cardiovascular Disease | Admitting: Cardiovascular Disease

## 2011-06-02 LAB — GLUCOSE, CAPILLARY: Glucose-Capillary: 116 mg/dL — ABNORMAL HIGH (ref 70–99)

## 2011-06-04 ENCOUNTER — Encounter (HOSPITAL_COMMUNITY)
Admission: RE | Admit: 2011-06-04 | Discharge: 2011-06-04 | Disposition: A | Discharge status: Home / Self Care | Payer: Medicaid Other | Source: Ambulatory Visit | Attending: Cardiovascular Disease | Admitting: Cardiovascular Disease

## 2011-06-06 ENCOUNTER — Encounter (HOSPITAL_COMMUNITY)
Admission: RE | Admit: 2011-06-06 | Discharge: 2011-06-06 | Disposition: A | Discharge status: Home / Self Care | Payer: Medicaid Other | Source: Ambulatory Visit | Attending: Cardiovascular Disease | Admitting: Cardiovascular Disease

## 2011-06-09 ENCOUNTER — Encounter (HOSPITAL_COMMUNITY)
Admission: RE | Admit: 2011-06-09 | Discharge: 2011-06-09 | Disposition: A | Discharge status: Home / Self Care | Payer: Medicaid Other | Source: Ambulatory Visit | Attending: Cardiovascular Disease | Admitting: Cardiovascular Disease

## 2011-06-11 ENCOUNTER — Other Ambulatory Visit (INDEPENDENT_AMBULATORY_CARE_PROVIDER_SITE_OTHER): Payer: Medicaid Other

## 2011-06-11 ENCOUNTER — Encounter (HOSPITAL_COMMUNITY)
Admission: RE | Admit: 2011-06-11 | Discharge: 2011-06-11 | Disposition: A | Discharge status: Home / Self Care | Payer: Medicaid Other | Source: Ambulatory Visit | Attending: Cardiovascular Disease | Admitting: Cardiovascular Disease

## 2011-06-11 DIAGNOSIS — I25810 Atherosclerosis of coronary artery bypass graft(s) without angina pectoris: Secondary | ICD-10-CM

## 2011-06-11 DIAGNOSIS — E7800 Pure hypercholesterolemia, unspecified: Secondary | ICD-10-CM

## 2011-06-11 LAB — BASIC METABOLIC PANEL
BUN: 17 mg/dL (ref 6–23)
CO2: 28 mEq/L (ref 19–32)
Calcium: 9.8 mg/dL (ref 8.4–10.5)
Creatinine, Ser: 1.1 mg/dL (ref 0.4–1.5)
Glucose, Bld: 117 mg/dL — ABNORMAL HIGH (ref 70–99)

## 2011-06-11 LAB — HEPATIC FUNCTION PANEL
Albumin: 4.3 g/dL (ref 3.5–5.2)
Alkaline Phosphatase: 54 U/L (ref 39–117)
Total Protein: 7.6 g/dL (ref 6.0–8.3)

## 2011-06-11 LAB — LIPID PANEL: HDL: 32 mg/dL — ABNORMAL LOW (ref >39.00)

## 2011-06-13 ENCOUNTER — Encounter (HOSPITAL_COMMUNITY): Payer: Medicaid Other

## 2011-06-13 ENCOUNTER — Telehealth (HOSPITAL_COMMUNITY): Payer: Self-pay | Admitting: *Deleted

## 2011-06-16 ENCOUNTER — Encounter (HOSPITAL_COMMUNITY): Payer: Medicaid Other

## 2011-06-18 ENCOUNTER — Encounter: Payer: Self-pay | Admitting: Cardiovascular Disease

## 2011-06-18 ENCOUNTER — Encounter (HOSPITAL_COMMUNITY)
Admission: RE | Admit: 2011-06-18 | Discharge: 2011-06-18 | Disposition: A | Discharge status: Home / Self Care | Payer: Medicaid Other | Source: Ambulatory Visit | Attending: Cardiovascular Disease | Admitting: Cardiovascular Disease

## 2011-06-18 ENCOUNTER — Ambulatory Visit (INDEPENDENT_AMBULATORY_CARE_PROVIDER_SITE_OTHER): Payer: Medicaid Other | Admitting: Cardiovascular Disease

## 2011-06-18 ENCOUNTER — Ambulatory Visit: Payer: Medicaid Other | Admitting: Nurse Practitioner

## 2011-06-18 ENCOUNTER — Ambulatory Visit: Payer: Medicaid Other | Admitting: Cardiovascular Disease

## 2011-06-18 VITALS — BP 136/81 | HR 73 | Ht 67.0 in | Wt 206.0 lb

## 2011-06-18 DIAGNOSIS — R0602 Shortness of breath: Secondary | ICD-10-CM

## 2011-06-18 DIAGNOSIS — I10 Essential (primary) hypertension: Secondary | ICD-10-CM

## 2011-06-18 DIAGNOSIS — I2510 Atherosclerotic heart disease of native coronary artery without angina pectoris: Secondary | ICD-10-CM

## 2011-06-18 MED ORDER — ENALAPRIL MALEATE 10 MG PO TABS: 10.0000 mg | ORAL_TABLET | Freq: Two times a day (BID) | ORAL | Status: DC

## 2011-06-18 MED ORDER — HYDROCHLOROTHIAZIDE 25 MG PO TABS: 12.5000 mg | ORAL_TABLET | Freq: Every day | ORAL | Status: DC

## 2011-06-18 MED ORDER — AMLODIPINE BESYLATE 10 MG PO TABS: 10.0000 mg | ORAL_TABLET | Freq: Every day | ORAL | Status: DC

## 2011-06-18 MED ORDER — NITROGLYCERIN 0.4 MG SL SUBL: 0.4000 mg | SUBLINGUAL_TABLET | SUBLINGUAL | Status: DC | PRN

## 2011-06-18 MED ORDER — POTASSIUM CHLORIDE CRYS ER 20 MEQ PO TBCR: 20.0000 meq | EXTENDED_RELEASE_TABLET | Freq: Every day | ORAL | Status: DC

## 2011-06-18 MED ORDER — METFORMIN HCL 1000 MG PO TABS: 1000.0000 mg | ORAL_TABLET | Freq: Every day | ORAL | Status: DC

## 2011-06-18 MED ORDER — METOPROLOL TARTRATE 100 MG PO TABS: 100.0000 mg | ORAL_TABLET | Freq: Two times a day (BID) | ORAL | Status: DC

## 2011-06-18 MED ORDER — CLOPIDOGREL BISULFATE 75 MG PO TABS: 75.0000 mg | ORAL_TABLET | Freq: Every day | ORAL | Status: DC

## 2011-06-18 MED ORDER — PRAVASTATIN SODIUM 40 MG PO TABS: 40.0000 mg | ORAL_TABLET | Freq: Every evening | ORAL | Status: DC

## 2011-06-18 NOTE — Progress Notes (Signed)
HPI:  51 year old gentleman presenting for followup evaluation. The patient has coronary artery disease status post CABG in 2012. He had recurrent chest pain following bypass surgery and this was difficult to characterize, mainly because of language barrier. A stress test showed inferior ischemia and the patient underwent cardiac catheterization demonstrating early occlusion of the vein graft to right PDA. His native right coronary artery which was severely diseased, was treated with overlapping drug-eluting stents. The patient has been maintained on aspirin and Plavix. He returns today for followup evaluation prior to a planned visit to [place].  Overall the patient is doing well. He does complain of residual numbness around his sternotomy incision and his vein graft harvest site in the left lower leg. He denies exertional chest pain or pressure. He is walking up to 2 hours daily without chest pain or pressure. He denies dyspnea, orthopnea, or PND. He complains of swelling of the left lower leg. He denies bleeding problems.  Outpatient Encounter Prescriptions as of 06/18/2011  Medication Sig Dispense Refill  . amLODipine (NORVASC) 10 MG tablet Take 1 tablet (10 mg total) by mouth daily.  90 tablet  2  . aspirin 81 MG tablet Take 1 tablet (81 mg total) by mouth daily.      . clopidogrel (PLAVIX) 75 MG tablet Take 1 tablet (75 mg total) by mouth daily.  90 tablet  3  . enalapril (VASOTEC) 10 MG tablet Take 1 tablet (10 mg total) by mouth 2 (two) times daily.  90 tablet  2  . hydrochlorothiazide (HYDRODIURIL) 25 MG tablet Take 0.5 tablets (12.5 mg total) by mouth daily.  45 tablet  2  . metFORMIN (GLUCOPHAGE) 1000 MG tablet Take 1 tablet (1,000 mg total) by mouth daily. One time daily after lunch  90 tablet  2  . metoprolol (LOPRESSOR) 100 MG tablet Take 1 tablet (100 mg total) by mouth 2 (two) times daily.  180 tablet  2  . nitroGLYCERIN (NITROSTAT) 0.4 MG SL tablet Place 1 tablet (0.4 mg total) under  the tongue every 5 (five) minutes as needed. For chest pain  25 tablet  6  . potassium chloride SA (K-DUR,KLOR-CON) 20 MEQ tablet Take 1 tablet (20 mEq total) by mouth daily.  90 tablet  2  . pravastatin (PRAVACHOL) 40 MG tablet Take 1 tablet (40 mg total) by mouth every evening.  90 tablet  2    No Known Allergies  Past Medical History  Diagnosis Date  . Hypertension   . Diabetes mellitus   . Asthma   . Hyperlipidemia   . Coronary artery disease     CABG 12/12 (LIMA-LAD, SVG-DX, SVG-OM, SVG-PDA) c/b post op AFib Rx with amio Rx. s/p successful PCI of the right coronary artery utilizing overlapping drug-eluting stents in the mid and distal vessel (done after abnormal nuc)  . Chest pain     01/2011 in setting of HTN urgency: Chest CT demonstrated no pulmonary embolus, postoperative appearance following CABG and small left pleural effusion and dependent atelectasis in the left lung  . Elevated hemoglobin     ROS: Negative except as per HPI  BP 136/81  Pulse 73  Ht 5\' 7"  (1.702 m)  Wt 93.441 kg (206 lb)  BMI 32.26 kg/m2  PHYSICAL EXAM: Pt is alert and oriented, NAD HEENT: normal Neck: JVP - normal, carotids 2+= without bruits Lungs: CTA bilaterally CV: RRR without murmur or gallop Abd: soft, NT, Positive BS, no hepatomegaly Ext: 1+ left pretibial edema, distal pulses intact  and equal Skin: warm/dry no rash  EKG:  EKG reviewed from 03/24/2011 and demonstrates normal sinus rhythm 71 beats per minute, nonspecific T wave abnormality.  ASSESSMENT AND PLAN: 1. CAD status post CABG and PCI the right coronary artery. The patient underwent PCI of the right coronary artery using overlapping drug-eluting stents on 03/07/2011. He was treated with a 3.5 x 32 mm Promus element stent and a 3.5 x 24 mm Promus element stent. These were deployed in overlapping fashion and postdilated with a 4.0 x 20 mm noncompliant balloon. There was an excellent angiographic result and the patient has done well  in the interim. He remains on dual antiplatelet therapy with aspirin and Plavix and should continue this at least 12 months from the time of his PCI.  2. Hypertension. Blood pressure is well controlled. He will remain on amlodipine, Vasotec, hydrochlorothiazide, and metoprolol.  3. Hyperlipidemia. The patient is on pravastatin 40 mg daily. Lipid panel from 06/11/2011 shows a cholesterol of 112, HDL 32, triglycerides 129, and LDL 54. His LFTs were normal with an AST of 34 and an ALT of 30.  4. Followup. The patient is going to visit his home and [place] for approximately 3 months. He needs 3 month prescriptions for his medicines. I will plan on seeing him back in October.  [name] 06/18/2011 2:54 PM

## 2011-06-18 NOTE — Patient Instructions (Signed)
Your physician wants you to follow-up in: October 2013 You will receive a reminder letter in the mail two months in advance. If you don't receive a letter, please call our office to schedule the follow-up appointment.  

## 2011-06-19 ENCOUNTER — Ambulatory Visit: Payer: Medicaid Other | Admitting: Physician Assistant

## 2011-06-20 ENCOUNTER — Encounter (HOSPITAL_COMMUNITY)
Admission: RE | Admit: 2011-06-20 | Discharge: 2011-06-20 | Disposition: A | Discharge status: Home / Self Care | Payer: Medicaid Other | Source: Ambulatory Visit | Attending: Cardiovascular Disease | Admitting: Cardiovascular Disease

## 2011-06-20 LAB — GLUCOSE, CAPILLARY: Glucose-Capillary: 168 mg/dL — ABNORMAL HIGH (ref 70–99)

## 2011-06-20 NOTE — Progress Notes (Signed)
[name] graduates today from cardiac rehab. The interpreter is present today to review homework.  [name] has a question about his refills on his prescriptions. [facility] pharmacy called on the patient's behalf.

## 2011-06-23 ENCOUNTER — Encounter (HOSPITAL_COMMUNITY): Payer: Medicaid Other

## 2011-06-24 ENCOUNTER — Ambulatory Visit: Payer: Medicaid Other | Admitting: Cardiovascular Disease

## 2011-06-25 ENCOUNTER — Encounter (HOSPITAL_COMMUNITY): Payer: Medicaid Other

## 2011-06-27 ENCOUNTER — Encounter (HOSPITAL_COMMUNITY): Payer: Medicaid Other

## 2011-06-30 ENCOUNTER — Encounter (HOSPITAL_COMMUNITY): Payer: Medicaid Other

## 2011-07-02 ENCOUNTER — Encounter (HOSPITAL_COMMUNITY): Payer: Medicaid Other

## 2011-07-04 ENCOUNTER — Encounter (HOSPITAL_COMMUNITY): Payer: Medicaid Other

## 2011-10-27 ENCOUNTER — Encounter (HOSPITAL_COMMUNITY): Payer: Self-pay | Admitting: *Deleted

## 2011-10-27 ENCOUNTER — Emergency Department (HOSPITAL_COMMUNITY)
Admission: EM | Admit: 2011-10-27 | Discharge: 2011-10-27 | Disposition: A | Discharge status: Home / Self Care | Payer: Medicaid Other | Attending: Emergency Medicine | Admitting: Emergency Medicine

## 2011-10-27 DIAGNOSIS — I2510 Atherosclerotic heart disease of native coronary artery without angina pectoris: Secondary | ICD-10-CM | POA: Insufficient documentation

## 2011-10-27 DIAGNOSIS — M7989 Other specified soft tissue disorders: Secondary | ICD-10-CM

## 2011-10-27 DIAGNOSIS — R42 Dizziness and giddiness: Secondary | ICD-10-CM | POA: Insufficient documentation

## 2011-10-27 DIAGNOSIS — R079 Chest pain, unspecified: Secondary | ICD-10-CM | POA: Insufficient documentation

## 2011-10-27 DIAGNOSIS — E119 Type 2 diabetes mellitus without complications: Secondary | ICD-10-CM | POA: Insufficient documentation

## 2011-10-27 DIAGNOSIS — Z79899 Other long term (current) drug therapy: Secondary | ICD-10-CM | POA: Insufficient documentation

## 2011-10-27 DIAGNOSIS — I10 Essential (primary) hypertension: Secondary | ICD-10-CM | POA: Insufficient documentation

## 2011-10-27 MED ORDER — HYDROCODONE-ACETAMINOPHEN 5-325 MG PO TABS: ORAL_TABLET | ORAL | Status: DC

## 2011-10-27 NOTE — Progress Notes (Signed)
*  Preliminary Results* Right lower extremity venous duplex completed. Right lower extremity is negative for deep vein thrombosis.  10/27/2011 6:22 PM Gertie Fey, RDMS, RDCS

## 2011-10-27 NOTE — ED Provider Notes (Addendum)
History  This chart was scribed for Keith Flores. Oletta Lamas, MD by Lynelle Smoke. The patient was seen in room TR10C/TR10C. Patient's care was started at 1628.   CSN: [id number]  Arrival date & time 10/27/11  1628   First MD Initiated Contact with Patient 10/27/11 1642      Chief Complaint  Patient presents with  . Leg Swelling     The history is provided by the patient. No language interpreter was used.    Keith Flores is a 51 y.o. male who presents to the Emergency Department complaining of swelling in his right lower leg with associated pain onset was 2 weeks ago. He states that he was seen by his PCP today and sent here for a doppler study. He reports a airplane ride to [place] one month ago. He also complains of dizziness that is worse with going from a sitting to standing position and bending over and neck pain. Both symptoms improve with rest. He denies having any prior kidney problems. He denies cough, rash and emesis. He reports taking ASA daily. He reports that he had a coronary artery bypass graft done by Dr. Tyrone Sage  at [facility] 10 months ago and states that he has occasional brief pain with movement of the right arm. He is a former smoker but denies alcohol use.  PCP is Dr. Winona Legato.  Cardiologist is Dr. Signa Kell  Past Medical History  Diagnosis Date  . Hypertension   . Diabetes mellitus   . Asthma   . Hyperlipidemia   . Coronary artery disease     CABG 12/12 (LIMA-LAD, SVG-DX, SVG-OM, SVG-PDA) c/b post op AFib Rx with amio Rx. s/p successful PCI of the right coronary artery utilizing overlapping drug-eluting stents in the mid and distal vessel (done after abnormal nuc)  . Chest pain     01/2011 in setting of HTN urgency: Chest CT demonstrated no pulmonary embolus, postoperative appearance following CABG and small left pleural effusion and dependent atelectasis in the left lung  . Elevated hemoglobin     Past Surgical History  Procedure Date  . Cardiac catheterization   .  Coronary artery bypass graft 12/30/2010    Procedure: CORONARY ARTERY BYPASS GRAFTING (CABG);  Surgeon: Delight Ovens, MD;  Location: [facility] OR;  Service: Open Heart Surgery;  Laterality: N/A;    Family History  Problem Relation Age of Onset  . Anesthesia problems Neg Hx   . Hypotension Neg Hx   . Malignant hyperthermia Neg Hx   . Pseudochol deficiency Neg Hx     History  Substance Use Topics  . Smoking status: Former Smoker    Quit date: 10/21/2010  . Smokeless tobacco: Never Used  . Alcohol Use: No      Review of Systems  Constitutional: Negative for fever and chills.  Respiratory: Negative for cough and shortness of breath.   Cardiovascular: Positive for chest pain and leg swelling.  Musculoskeletal: Negative for back pain.       Positive for right lower calf swelling  Skin: Negative for rash.  Hematological: Does not bruise/bleed easily.  All other systems reviewed and are negative.    Allergies  Review of patient's allergies indicates no known allergies.  Home Medications   Current Outpatient Rx  Name Route Sig Dispense Refill  . AMLODIPINE BESYLATE 10 MG PO TABS Oral Take 10 mg by mouth daily.    . ASPIRIN 81 MG PO TABS Oral Take 81 mg by mouth daily.    Marland Kitchen  CLOPIDOGREL BISULFATE 75 MG PO TABS Oral Take 75 mg by mouth daily.    . ENALAPRIL MALEATE 10 MG PO TABS Oral Take 10 mg by mouth 2 (two) times daily.    Marland Kitchen HYDROCHLOROTHIAZIDE 25 MG PO TABS Oral Take 12.5 mg by mouth daily.    Marland Kitchen METFORMIN HCL 1000 MG PO TABS Oral Take 1,000 mg by mouth daily. One time daily after lunch    . METOPROLOL TARTRATE 100 MG PO TABS Oral Take 100 mg by mouth 2 (two) times daily.    Marland Kitchen NITROGLYCERIN 0.4 MG SL SUBL Sublingual Place 0.4 mg under the tongue every 5 (five) minutes as needed. For chest pain    . POTASSIUM CHLORIDE CRYS ER 20 MEQ PO TBCR Oral Take 20 mEq by mouth daily.    Marland Kitchen PRAVASTATIN SODIUM 40 MG PO TABS Oral Take 40 mg by mouth every evening.      Triage Vitals: BP  131/86  Pulse 72  Temp 98.1 F (36.7 C) (Oral)  Resp 18  SpO2 95%  Physical Exam  Nursing note and vitals reviewed. Constitutional: He is oriented to person, place, and time. He appears well-developed and well-nourished. No distress.  HENT:  Head: Normocephalic and atraumatic.  Eyes: Conjunctivae normal and EOM are normal.  Neck: Neck supple. No tracheal deviation present.  Cardiovascular: Normal rate and regular rhythm.   Pulmonary/Chest: Effort normal and breath sounds normal. No respiratory distress.       Well healed surgical scar, no crepitance   Abdominal: Soft. He exhibits no distension. There is no tenderness.  Musculoskeletal: Normal range of motion. He exhibits edema.       Asymmetric swelling to the right lower leg, ecchymosis present on the right posterior calf  Neurological: He is alert and oriented to person, place, and time.  Skin: Skin is warm and dry.  Psychiatric: He has a normal mood and affect. His behavior is normal.    ED Course  Procedures (including critical care time)  DIAGNOSTIC STUDIES: Oxygen Saturation is 95% on room air, normal by my interpretation.    COORDINATION OF CARE: 5:30PM-Patient nformed of clinical course, understand medical decision-making process, and agree with plan.   Labs Reviewed - No data to display No results found.   1. Swelling of right lower extremity     8:07 PM Vascular study showed no DVT.  Pt mentioned he gets very brief CP that is focal which he attributes to his recent surgery.  No sweats, SOB, nausea, doubt ACS.  No pleurisy, sats are normal, doubt PE.    MDM  I personally performed the services described in this documentation, which was scribed in my presence. The recorded information has been reviewed and considered.   Pt sent here from PCP for U/S of leg to r/o DVT.  Pt did fly to [place] 1 month ago.  Good pulses, bruising noted to posterior calf, likely swelling is due to layering out of hematoma.  Pt is ok  to follow up with Dr. Mikeal Hawthorne as outpt if not improving in the next week.  TED hose may be an option.      Keith Flores. Oletta Lamas, MD 10/27/11 2007  Keith Flores. Oletta Lamas, MD 10/27/11 2009

## 2011-10-27 NOTE — ED Notes (Signed)
The pt has had rt leg swelling for 2 weeks with pain he was sent here today to get a doppler study

## 2011-11-12 ENCOUNTER — Encounter: Payer: Self-pay | Admitting: Cardiovascular Disease

## 2011-11-25 ENCOUNTER — Ambulatory Visit: Payer: Medicaid Other | Admitting: Cardiovascular Disease

## 2011-12-26 ENCOUNTER — Ambulatory Visit (INDEPENDENT_AMBULATORY_CARE_PROVIDER_SITE_OTHER): Payer: Medicaid Other | Admitting: Cardiovascular Disease

## 2011-12-26 ENCOUNTER — Encounter: Payer: Self-pay | Admitting: Cardiovascular Disease

## 2011-12-26 VITALS — BP 132/83 | HR 98 | Ht 68.4 in | Wt 215.0 lb

## 2011-12-26 DIAGNOSIS — I2510 Atherosclerotic heart disease of native coronary artery without angina pectoris: Secondary | ICD-10-CM

## 2011-12-26 LAB — BASIC METABOLIC PANEL
Calcium: 9.8 mg/dL (ref 8.4–10.5)
GFR: 80.36 mL/min (ref >60.00)
Sodium: 140 mEq/L (ref 135–145)

## 2011-12-26 LAB — HEPATIC FUNCTION PANEL
Albumin: 4.5 g/dL (ref 3.5–5.2)
Alkaline Phosphatase: 57 U/L (ref 39–117)
Bilirubin, Direct: 0 mg/dL (ref 0.0–0.3)

## 2011-12-26 NOTE — Patient Instructions (Addendum)
Your physician wants you to follow-up in:  6 months.  You will receive a reminder letter in the mail two months in advance. If you don't receive a letter, please call our office to schedule the follow-up appointment.  We will call you with the results of blood work done today

## 2011-12-26 NOTE — Progress Notes (Signed)
HPI:  51 year old gentleman presenting for followup evaluation. The patient has CAD status post CABG in 2010. He had early graft occlusion of the vein graft RCA. He underwent extensive stenting of the native right coronary artery with a good result. He's also followed for hypertension and hyperlipidemia.  Overall the patient is doing well. He had a good visit to [place] over the summer. He denies chest pain, chest pressure, shortness of breath, orthopnea, or PND. He admits to mild leg swelling. He has no other complaints.  Outpatient Encounter Prescriptions as of 12/26/2011  Medication Sig Dispense Refill  . amLODipine (NORVASC) 10 MG tablet Take 10 mg by mouth daily.      Marland Kitchen aspirin 81 MG tablet Take 81 mg by mouth daily.      . clopidogrel (PLAVIX) 75 MG tablet Take 75 mg by mouth daily.      . enalapril (VASOTEC) 10 MG tablet Take 10 mg by mouth 2 (two) times daily.      . hydrochlorothiazide (HYDRODIURIL) 25 MG tablet Take 12.5 mg by mouth daily.      . metFORMIN (GLUCOPHAGE) 1000 MG tablet Take 1,000 mg by mouth daily. One time daily after lunch      . metoprolol (LOPRESSOR) 100 MG tablet Take 100 mg by mouth 2 (two) times daily.      . nitroGLYCERIN (NITROSTAT) 0.4 MG SL tablet Place 0.4 mg under the tongue every 5 (five) minutes as needed. For chest pain      . potassium chloride SA (K-DUR,KLOR-CON) 20 MEQ tablet Take 20 mEq by mouth as needed.       . pravastatin (PRAVACHOL) 40 MG tablet Take 40 mg by mouth every evening.      Marland Kitchen HYDROcodone-acetaminophen (NORCO/VICODIN) 5-325 MG per tablet 1-2 tablets by mouth every 6 hours as needed for moderate to severe pain  20 tablet  0    No Known Allergies  Past Medical History  Diagnosis Date  . Hypertension   . Diabetes mellitus   . Asthma   . Hyperlipidemia   . Coronary artery disease     CABG 12/12 (LIMA-LAD, SVG-DX, SVG-OM, SVG-PDA) c/b post op AFib Rx with amio Rx. s/p successful PCI of the right coronary artery utilizing overlapping  drug-eluting stents in the mid and distal vessel (done after abnormal nuc)  . Chest pain     01/2011 in setting of HTN urgency: Chest CT demonstrated no pulmonary embolus, postoperative appearance following CABG and small left pleural effusion and dependent atelectasis in the left lung  . Elevated hemoglobin     ROS: Negative except as per HPI  BP 132/83  Pulse 98  Ht 5' 8.4" (1.737 m)  Wt 97.523 kg (215 lb)  BMI 32.31 kg/m2  PHYSICAL EXAM: Pt is alert and oriented, NAD HEENT: normal Neck: JVP - normal, carotids 2+= without bruits Lungs: CTA bilaterally Chest: Well-healed sternotomy scar CV: RRR without murmur or gallop Abd: soft, NT, Positive BS, no hepatomegaly Ext: Trace pretibial edema bilaterally, distal pulses intact and equal Skin: warm/dry no rash  EKG:  Normal sinus rhythm 98 beats per minute, left atrial enlargement, otherwise within normal limits.  ASSESSMENT AND PLAN: 1. Coronary atherosclerosis, native vessel. The patient is stable on his current medical program. He's on dual antiplatelet therapy with aspirin and Plavix after extensive stenting of the right coronary artery. I would be inclined to continue him on dual antiplatelet therapy to 2 years from the time of his PCI procedure. No medication changes  were made today.  2. Hyperlipidemia. The patient is on pravastatin. His last cholesterol panel showed total cholesterol of 112 with an HDL of 32 and LDL 54. He will continue on the same medication.  3. Essential hypertension, well controlled. He remains on metoprolol, hydrochlorothiazide, amlodipine, and enalapril.  4. Type 2 diabetes. The patient is followed by Dr. Mikeal Hawthorne. He's treated with Metformin.  Keith Flores 12/26/2011 3:41 PM

## 2012-03-04 ENCOUNTER — Other Ambulatory Visit: Payer: Self-pay | Admitting: Physician Assistant

## 2012-08-02 ENCOUNTER — Other Ambulatory Visit: Payer: Self-pay | Admitting: Cardiovascular Disease

## 2012-08-05 ENCOUNTER — Other Ambulatory Visit: Payer: Self-pay | Admitting: *Deleted

## 2012-08-05 MED ORDER — NITROGLYCERIN 0.4 MG SL SUBL: 0.4000 mg | SUBLINGUAL_TABLET | SUBLINGUAL | Status: DC | PRN

## 2012-08-05 MED ORDER — CLOPIDOGREL BISULFATE 75 MG PO TABS: 75.0000 mg | ORAL_TABLET | Freq: Every day | ORAL | Status: DC

## 2012-08-05 MED ORDER — PRAVASTATIN SODIUM 40 MG PO TABS: 40.0000 mg | ORAL_TABLET | Freq: Every evening | ORAL | Status: DC

## 2012-10-15 ENCOUNTER — Other Ambulatory Visit: Payer: Self-pay | Admitting: *Deleted

## 2012-10-15 MED ORDER — ENALAPRIL MALEATE 10 MG PO TABS: 10.0000 mg | ORAL_TABLET | Freq: Two times a day (BID) | ORAL | Status: DC

## 2012-10-15 MED ORDER — POTASSIUM CHLORIDE CRYS ER 20 MEQ PO TBCR: 20.0000 meq | EXTENDED_RELEASE_TABLET | ORAL | Status: DC | PRN

## 2012-10-15 MED ORDER — HYDROCHLOROTHIAZIDE 25 MG PO TABS: 12.5000 mg | ORAL_TABLET | Freq: Every day | ORAL | Status: DC

## 2012-10-15 MED ORDER — PRAVASTATIN SODIUM 40 MG PO TABS: 40.0000 mg | ORAL_TABLET | Freq: Every evening | ORAL | Status: DC

## 2012-10-15 MED ORDER — METOPROLOL TARTRATE 100 MG PO TABS: ORAL_TABLET | ORAL | Status: DC

## 2012-10-15 MED ORDER — AMLODIPINE BESYLATE 5 MG PO TABS: ORAL_TABLET | ORAL | Status: DC

## 2012-10-15 MED ORDER — CLOPIDOGREL BISULFATE 75 MG PO TABS: 75.0000 mg | ORAL_TABLET | Freq: Every day | ORAL | Status: DC

## 2012-10-18 ENCOUNTER — Telehealth: Payer: Self-pay | Admitting: *Deleted

## 2012-10-18 MED ORDER — METFORMIN HCL 1000 MG PO TABS: 1000.0000 mg | ORAL_TABLET | Freq: Every day | ORAL | Status: DC

## 2012-10-18 NOTE — Telephone Encounter (Signed)
refill 

## 2012-10-20 NOTE — Telephone Encounter (Signed)
He should go to PCP for treatment of diabetes. thx

## 2012-12-15 ENCOUNTER — Other Ambulatory Visit: Payer: Self-pay

## 2012-12-15 ENCOUNTER — Encounter: Payer: Self-pay | Admitting: Cardiovascular Disease

## 2012-12-15 ENCOUNTER — Ambulatory Visit (INDEPENDENT_AMBULATORY_CARE_PROVIDER_SITE_OTHER): Payer: Medicaid Other | Admitting: Cardiovascular Disease

## 2012-12-15 ENCOUNTER — Encounter (INDEPENDENT_AMBULATORY_CARE_PROVIDER_SITE_OTHER): Payer: Self-pay

## 2012-12-15 VITALS — BP 160/100 | HR 76 | Ht 68.0 in | Wt 212.0 lb

## 2012-12-15 DIAGNOSIS — I10 Essential (primary) hypertension: Secondary | ICD-10-CM

## 2012-12-15 DIAGNOSIS — I2510 Atherosclerotic heart disease of native coronary artery without angina pectoris: Secondary | ICD-10-CM

## 2012-12-15 DIAGNOSIS — R079 Chest pain, unspecified: Secondary | ICD-10-CM

## 2012-12-15 MED ORDER — HYDROCHLOROTHIAZIDE 25 MG PO TABS: 25.0000 mg | ORAL_TABLET | Freq: Every day | ORAL | Status: DC

## 2012-12-15 MED ORDER — PRAVASTATIN SODIUM 40 MG PO TABS: 40.0000 mg | ORAL_TABLET | Freq: Every evening | ORAL | Status: DC

## 2012-12-15 MED ORDER — ENALAPRIL MALEATE 10 MG PO TABS: 10.0000 mg | ORAL_TABLET | Freq: Two times a day (BID) | ORAL | Status: DC

## 2012-12-15 MED ORDER — CARVEDILOL 25 MG PO TABS: 25.0000 mg | ORAL_TABLET | Freq: Two times a day (BID) | ORAL | Status: DC

## 2012-12-15 MED ORDER — AMLODIPINE BESYLATE 10 MG PO TABS: 10.0000 mg | ORAL_TABLET | Freq: Every day | ORAL | Status: DC

## 2012-12-15 MED ORDER — POTASSIUM CHLORIDE CRYS ER 20 MEQ PO TBCR: 20.0000 meq | EXTENDED_RELEASE_TABLET | ORAL | Status: DC | PRN

## 2012-12-15 MED ORDER — NITROGLYCERIN 0.4 MG SL SUBL: 0.4000 mg | SUBLINGUAL_TABLET | SUBLINGUAL | Status: DC | PRN

## 2012-12-15 MED ORDER — CLOPIDOGREL BISULFATE 75 MG PO TABS: 75.0000 mg | ORAL_TABLET | Freq: Every day | ORAL | Status: DC

## 2012-12-15 NOTE — Progress Notes (Signed)
HPI:  52 year old gentleman presenting for followup evaluation. He was last seen 12/26/2011. He has coronary artery disease status post CABG in 2010. The patient had early graft closure involving the vein graft to right coronary artery and he underwent extensive stenting of the native RCA with a good result. He's followed for hypertension and hyperlipidemia as well. Last labs from May 2013 showed a cholesterol of 112, triglycerides 129, HDL 32, and LDL 54.  The patient is here with an interpreter. He has had increasing chest pain with emotional stress or anger. He has not had exertional chest discomfort recently. He walks regularly with no exertional symptoms. He denies shortness of breath, edema, or palpitations. He notes that his blood pressure has not been well controlled. He is compliant with his medications. He describes his chest pain as a pressure across the center of his chest. It is nonradiating.  Outpatient Encounter Prescriptions as of 12/15/2012  Medication Sig  . amLODipine (NORVASC) 10 MG tablet Take 10 mg by mouth daily.  Marland Kitchen amLODipine (NORVASC) 5 MG tablet TAKE ONE TABLET BY MOUTH EVERY DAY  . aspirin 81 MG tablet Take 81 mg by mouth daily.  . clopidogrel (PLAVIX) 75 MG tablet Take 1 tablet (75 mg total) by mouth daily.  . enalapril (VASOTEC) 10 MG tablet Take 1 tablet (10 mg total) by mouth 2 (two) times daily.  . hydrochlorothiazide (HYDRODIURIL) 25 MG tablet Take 0.5 tablets (12.5 mg total) by mouth daily.  Marland Kitchen KLOR-CON M20 20 MEQ tablet TAKE ONE TABLET BY MOUTH EVERY DAY  . metFORMIN (GLUCOPHAGE) 1000 MG tablet Take 1 tablet (1,000 mg total) by mouth daily after lunch.  . metoprolol (LOPRESSOR) 100 MG tablet TAKE ONE TABLET BY MOUTH TWICE DAILY  . nitroGLYCERIN (NITROSTAT) 0.4 MG SL tablet Place 1 tablet (0.4 mg total) under the tongue every 5 (five) minutes as needed. For chest pain  . potassium chloride SA (K-DUR,KLOR-CON) 20 MEQ tablet Take 1 tablet (20 mEq total) by mouth  as needed.  . pravastatin (PRAVACHOL) 40 MG tablet Take 1 tablet (40 mg total) by mouth every evening.  . [DISCONTINUED] metoprolol (LOPRESSOR) 100 MG tablet Take 100 mg by mouth 2 (two) times daily.    No Known Allergies  Past Medical History  Diagnosis Date  . Hypertension   . Diabetes mellitus   . Asthma   . Hyperlipidemia   . Coronary artery disease     CABG 12/12 (LIMA-LAD, SVG-DX, SVG-OM, SVG-PDA) c/b post op AFib Rx with amio Rx. s/p successful PCI of the right coronary artery utilizing overlapping drug-eluting stents in the mid and distal vessel (done after abnormal nuc)  . Chest pain     01/2011 in setting of HTN urgency: Chest CT demonstrated no pulmonary embolus, postoperative appearance following CABG and small left pleural effusion and dependent atelectasis in the left lung  . Elevated hemoglobin    ROS: Negative except as per HPI  BP 160/100  Pulse 76  Ht 5\' 8"  (1.727 m)  Wt 212 lb (96.163 kg)  BMI 32.24 kg/m2  PHYSICAL EXAM: Pt is alert and oriented, NAD HEENT: normal Neck: JVP - normal, carotids 2+= without bruits Lungs: CTA bilaterally CV: RRR without murmur or gallop Abd: soft, NT, Positive BS, no hepatomegaly Ext: no C/C/E, distal pulses intact and equal Skin: warm/dry no rash  EKG:  Normal sinus rhythm, left atrial enlargement, otherwise within normal limits.  ASSESSMENT AND PLAN: 1. Coronary artery disease, native vessel. The patient has angina with  emotional stress in this is increasing. However, he is asymptomatic with exertion. He has extensive coronary artery disease with history of CABG followed by PCI after early graft failure. I recommended an exercise Myoview scan for further risk stratification. I am going to change his metoprolol to carvedilol for better blood pressure control.  2. Essential hypertension, suboptimal control. As above, his beta blocker will be changed carvedilol. Hydrochlorothiazide will be doubled.  3. Dyslipidemia. The  patient will continue on pravastatin.  I'll see him back in 6 months unless his Myoview scan shows significant ischemia. He will continue to monitor his blood pressure at home.  Keith Flores 12/17/2012 6:02 PM

## 2012-12-15 NOTE — Patient Instructions (Addendum)
Your physician has recommended you make the following change in your medication: INCREASE HCTZ to 25mg  take one by mouth daily, STOP Metoprolol Tartrate, START Carvedilol 25mg  take one by mouth twice a day  Your physician has requested that you have an exercise stress myoview. For further information please visit [email or web address] Please follow instruction sheet, as given.  Your physician wants you to follow-up in: 6 MONTHS with Dr Excell Seltzer.  You will receive a reminder letter in the mail two months in advance. If you don't receive a letter, please call our office to schedule the follow-up appointment.

## 2012-12-17 ENCOUNTER — Other Ambulatory Visit: Payer: Self-pay | Admitting: Cardiovascular Disease

## 2012-12-17 ENCOUNTER — Other Ambulatory Visit: Payer: Self-pay

## 2012-12-17 DIAGNOSIS — I2510 Atherosclerotic heart disease of native coronary artery without angina pectoris: Secondary | ICD-10-CM

## 2012-12-17 DIAGNOSIS — I10 Essential (primary) hypertension: Secondary | ICD-10-CM

## 2012-12-17 DIAGNOSIS — R079 Chest pain, unspecified: Secondary | ICD-10-CM

## 2012-12-17 MED ORDER — CARVEDILOL 25 MG PO TABS: 25.0000 mg | ORAL_TABLET | Freq: Two times a day (BID) | ORAL | Status: DC

## 2012-12-24 ENCOUNTER — Other Ambulatory Visit: Payer: Self-pay

## 2012-12-24 DIAGNOSIS — I2510 Atherosclerotic heart disease of native coronary artery without angina pectoris: Secondary | ICD-10-CM

## 2012-12-24 DIAGNOSIS — I10 Essential (primary) hypertension: Secondary | ICD-10-CM

## 2013-01-04 ENCOUNTER — Encounter: Payer: Self-pay | Admitting: Cardiology

## 2013-01-04 ENCOUNTER — Ambulatory Visit (HOSPITAL_COMMUNITY): Payer: Medicaid Other | Attending: Cardiology | Admitting: Radiology

## 2013-01-04 ENCOUNTER — Other Ambulatory Visit (INDEPENDENT_AMBULATORY_CARE_PROVIDER_SITE_OTHER): Payer: Medicaid Other

## 2013-01-04 VITALS — BP 141/92 | HR 66 | Ht 68.0 in | Wt 212.0 lb

## 2013-01-04 DIAGNOSIS — I2510 Atherosclerotic heart disease of native coronary artery without angina pectoris: Secondary | ICD-10-CM

## 2013-01-04 DIAGNOSIS — I10 Essential (primary) hypertension: Secondary | ICD-10-CM

## 2013-01-04 DIAGNOSIS — R079 Chest pain, unspecified: Secondary | ICD-10-CM

## 2013-01-04 LAB — LIPID PANEL
Total CHOL/HDL Ratio: 4
Triglycerides: 104 mg/dL (ref 0.0–149.0)

## 2013-01-04 LAB — HEPATIC FUNCTION PANEL
ALT: 40 U/L (ref 0–53)
AST: 22 U/L (ref 0–37)
Bilirubin, Direct: 0.1 mg/dL (ref 0.0–0.3)
Total Protein: 7.2 g/dL (ref 6.0–8.3)

## 2013-01-04 LAB — BASIC METABOLIC PANEL
BUN: 14 mg/dL (ref 6–23)
CO2: 25 mEq/L (ref 19–32)
Chloride: 101 mEq/L (ref 96–112)
Creatinine, Ser: 0.9 mg/dL (ref 0.4–1.5)

## 2013-01-04 MED ORDER — TECHNETIUM TC 99M SESTAMIBI GENERIC - CARDIOLITE
30.0000 | Freq: Once | INTRAVENOUS | Status: AC | PRN
  Administered 2013-01-04: 30 via INTRAVENOUS

## 2013-01-04 MED ORDER — TECHNETIUM TC 99M SESTAMIBI GENERIC - CARDIOLITE
10.0000 | Freq: Once | INTRAVENOUS | Status: AC | PRN
  Administered 2013-01-04: 10 via INTRAVENOUS

## 2013-01-04 MED ORDER — REGADENOSON 0.4 MG/5ML IV SOLN
0.4000 mg | Freq: Once | INTRAVENOUS | Status: AC
  Administered 2013-01-04: 0.4 mg via INTRAVENOUS

## 2013-01-04 NOTE — Progress Notes (Signed)
[facility] SITE 3 NUCLEAR MED [address] [place], [place] [address] [phone]    Cardiology Nuclear Med Study  Keith Flores is a 52 y.o. male     MRN : [id number]     DOB: [birth year]  Procedure Date: 01/04/2013  Nuclear Med Background Indication for Stress Test:  Evaluation for Ischemia, Graft Patency and Stent Patency History:  CAD, CABG 2012, Stent RCA 2/13, Echo 2013 EF 50-55%, MPI 2/13 ischemia (mod inf. wall) EF 57% Cardiac Risk Factors: Hypertension, Lipids, NIDDM and Smoker  Symptoms:  Chest Pain and SOB   Nuclear Pre-Procedure Caffeine/Decaff Intake:  7:00pm NPO After: 10:00pm   Lungs:  clear O2 Sat: 97% on room air. IV 0.9% NS with Angio Cath:  20g  IV Site: R Hand  IV Started by:  Cathlyn Parsons, RN  Chest Size (in):  46 Cup Size: n/a  Height: 5\' 8"  (1.727 m)  Weight:  212 lb (96.163 kg)  BMI:  Body mass index is 32.24 kg/(m^2). Tech Comments:  No Coreg x 12 hrs    Nuclear Med Study 1 or 2 day study: 1 day  Stress Test Type:  Treadmill/Lexiscan  Reading MD: Olga Millers, MD  Order Authorizing Provider:  Casimiro Needle Cooper,MD  Resting Radionuclide: Technetium 71m Sestamibi  Resting Radionuclide Dose: 11.0 mCi   Stress Radionuclide:  Technetium 49m Sestamibi  Stress Radionuclide Dose: 33.0 mCi           Stress Protocol Rest HR: 66 Stress HR: 125  Rest BP: 141/92 Stress BP: 212/118  Exercise Time (min): n/a METS: n/a           Dose of Adenosine (mg):  n/a Dose of Lexiscan: 0.4 mg  Dose of Atropine (mg): n/a Dose of Dobutamine: n/a mcg/kg/min (at max HR)  Stress Test Technologist: Nelson Chimes, BS-ES  Nuclear Technologist:  Domenic Polite, CNMT     Rest Procedure:  Myocardial perfusion imaging was performed at rest 45 minutes following the intravenous administration of Technetium 67m Sestamibi. Rest ECG: NSR, IRBBB.  Stress Procedure:  The patient received IV Lexiscan 0.4 mg over 15-seconds with concurrent low level  exercise and then Technetium 75m Sestamibi was injected at 30-seconds while the patient continued walking one more minute.  Quantitative spect images were obtained after a 45-minute delay.  Patient began test on Bruce Protocol.  He became very SOB and asked to stop.  We switched him to a low level lexiscan.  With the infusion of Lexiscan, the patient complained of SOB and chest tightness.  These symptoms completely resolved in recovery.  Stress ECG: No significant ST segment change suggestive of ischemia.  QPS Raw Data Images:  Acquisition technically good; normal left ventricular size. Stress Images:  There is decreased uptake in the inferior wall. Rest Images:  There is decreased uptake in the inferior wall, less prominent compared to the stress images. Subtraction (SDS):  These findings are consistent with inferior thinning vs prior infarct and mild inferior ischemia. Transient Ischemic Dilatation (Normal <1.22):  1.16 Lung/Heart Ratio (Normal <0.45):  0.24  Quantitative Gated Spect Images QGS EDV:  115 ml QGS ESV:  55 ml  Impression Exercise Capacity:  Lexiscan with low level exercise. BP Response:  Hypertensive blood pressure response. Clinical Symptoms:  There is dyspnea and chest tightness. ECG Impression:  No significant ST segment change suggestive of ischemia. Comparison with Prior Nuclear Study: Compared to study of 03/04/11, inferior ischemia is less severe.  Overall Impression:  Low risk stress  nuclear study with a moderate size, medium intensity, partially reversible inferior defect consistent with inferior thinning vs prior infarct and mild inferior ischemia.  LV Ejection Fraction: 52%.  LV Wall Motion:  NL LV Function; NL Wall Motion   Olga Millers

## 2013-01-10 ENCOUNTER — Encounter: Payer: Self-pay | Admitting: Physician Assistant

## 2013-01-10 ENCOUNTER — Ambulatory Visit (INDEPENDENT_AMBULATORY_CARE_PROVIDER_SITE_OTHER): Payer: Medicaid Other | Admitting: Physician Assistant

## 2013-01-10 ENCOUNTER — Encounter: Payer: Self-pay | Admitting: *Deleted

## 2013-01-10 VITALS — BP 130/78 | HR 83 | Ht 68.0 in | Wt 213.0 lb

## 2013-01-10 DIAGNOSIS — E782 Mixed hyperlipidemia: Secondary | ICD-10-CM

## 2013-01-10 DIAGNOSIS — I209 Angina pectoris, unspecified: Secondary | ICD-10-CM

## 2013-01-10 DIAGNOSIS — I2510 Atherosclerotic heart disease of native coronary artery without angina pectoris: Secondary | ICD-10-CM

## 2013-01-10 DIAGNOSIS — I208 Other forms of angina pectoris: Secondary | ICD-10-CM

## 2013-01-10 DIAGNOSIS — I10 Essential (primary) hypertension: Secondary | ICD-10-CM

## 2013-01-10 DIAGNOSIS — R9439 Abnormal result of other cardiovascular function study: Secondary | ICD-10-CM

## 2013-01-10 NOTE — Patient Instructions (Signed)
Your physician has requested that you have a cardiac catheterization. Cardiac catheterization is used to diagnose and/or treat various heart conditions. Doctors may recommend this procedure for a number of different reasons. The most common reason is to evaluate chest pain. Chest pain can be a symptom of coronary artery disease (CAD), and cardiac catheterization can show whether plaque is narrowing or blocking your heart's arteries. This procedure is also used to evaluate the valves, as well as measure the blood flow and oxygen levels in different parts of your heart. For further information please visit [email or web address]. Please follow instruction sheet, as given.  LAB WORK TODAY BMET, CBC W.DIFF, PT/INR 

## 2013-01-10 NOTE — Progress Notes (Signed)
[address] [place], [place]  [address] Phone: [phone] Fax:  [phone]  Date:  01/10/2013   ID:  Keith Flores, DOB March 29, 1960, MRN [id number]  PCP:  Lonia Blood, MD  Cardiologist:  Dr. [name]     History of Present Illness: Keith Flores is a 52 y.o. [place] male with a hx of CAD, s/p CABG in 2010, HTN, HL.  He had early graft closure and underwent extensive stenting of the native RCA.  LHC in 02/2011: S-PDA occluded, S-OM patent, S-D1 patent, L-LAD patent.  PCI: Promus DES x 2 to mid and dist RCA.  Echo (02/2011):  Mild LVH, EF 55%, Gr 2 DD, mild dilated ascending aorta (3.8 cm), trivial MR.  Last seen by Dr. [name] 11/2012.  He complained of angina with emotional stress.  LexiScan Myoview was done recently and noted to be abnormal.  He is brought in to arrange cardiac catheterization.    Since his stress test, he has not had any further chest pain.  He denies exertional symptoms.  He has only had chest pain with getting angry or stressed.  He denies significant dyspnea.  No orthopnea, PND, edema.  No syncope.    Lexiscan Myoview (01/05/2013): Impression  Exercise Capacity: Lexiscan with low level exercise.  BP Response: Hypertensive blood pressure response.  Clinical Symptoms: There is dyspnea and chest tightness.  ECG Impression: No significant ST segment change suggestive of ischemia.  Comparison with Prior Nuclear Study: Compared to study of 03/04/11, inferior ischemia is less severe.  Overall Impression: Low risk stress nuclear study with a moderate size, medium intensity, partially reversible inferior defect consistent with inferior thinning vs prior infarct and mild inferior ischemia.  LV Ejection Fraction: 52%. LV Wall Motion: NL LV Function; NL Wall Motion  Recent Labs: 01/04/2013: ALT 40; Creatinine 0.9; HDL 31.60*; LDL (calc) 76; Potassium 3.6; TSH 1.79   Wt Readings from Last 3 Encounters:  01/10/13 213 lb (96.616 kg)   01/04/13 212 lb (96.163 kg)  12/15/12 212 lb (96.163 kg)     Past Medical History  Diagnosis Date  . Hypertension   . Diabetes mellitus   . Asthma   . Hyperlipidemia   . Coronary artery disease     CABG 12/12 (LIMA-LAD, SVG-DX, SVG-OM, SVG-PDA) c/b post op AFib Rx with amio Rx. s/p successful PCI of the right coronary artery utilizing overlapping drug-eluting stents in the mid and distal vessel (done after abnormal nuc)  . Chest pain     01/2011 in setting of HTN urgency: Chest CT demonstrated no pulmonary embolus, postoperative appearance following CABG and small left pleural effusion and dependent atelectasis in the left lung  . Elevated hemoglobin     Current Outpatient Prescriptions  Medication Sig Dispense Refill  . amLODipine (NORVASC) 10 MG tablet Take 1 tablet (10 mg total) by mouth daily.  90 tablet  4  . aspirin 81 MG tablet Take 81 mg by mouth daily.      . carvedilol (COREG) 25 MG tablet Take 1 tablet (25 mg total) by mouth 2 (two) times daily.  180 tablet  3  . clopidogrel (PLAVIX) 75 MG tablet Take 1 tablet (75 mg total) by mouth daily.  90 tablet  4  . enalapril (VASOTEC) 10 MG tablet Take 1 tablet (10 mg total) by mouth 2 (two) times daily.  180 tablet  4  . hydrochlorothiazide (HYDRODIURIL) 25 MG tablet Take 1 tablet (25 mg total) by mouth  daily.  90 tablet  3  . metFORMIN (GLUCOPHAGE) 1000 MG tablet Take 1 tablet (1,000 mg total) by mouth daily after lunch.  30 tablet  1  . nitroGLYCERIN (NITROSTAT) 0.4 MG SL tablet Place 1 tablet (0.4 mg total) under the tongue every 5 (five) minutes as needed. For chest pain  25 tablet  3  . potassium chloride SA (K-DUR,KLOR-CON) 20 MEQ tablet Take 1 tablet (20 mEq total) by mouth as needed.  90 tablet  4  . pravastatin (PRAVACHOL) 40 MG tablet Take 1 tablet (40 mg total) by mouth every evening.  90 tablet  4   No current facility-administered medications for this visit.    Allergies:   Review of patient's allergies indicates no  known allergies.   Social History:  The patient  reports that he has been smoking.  He has never used smokeless tobacco. He reports that he does not drink alcohol or use illicit drugs.   Family History:  The patient's family history is negative for Anesthesia problems, Hypotension, Malignant hyperthermia, and Pseudochol deficiency.   ROS:  Please see the history of present illness.    All other systems reviewed and negative.   PHYSICAL EXAM: VS:  BP 130/78  Pulse 83  Ht 5\' 8"  (1.727 m)  Wt 213 lb (96.616 kg)  BMI 32.39 kg/m2 Well nourished, well developed, in no acute distress HEENT: normal Neck: no JVD Cardiac:  normal S1, S2; RRR; no murmur Lungs:  clear to auscultation bilaterally, no wheezing, rhonchi or rales Abd: soft, nontender, no hepatomegaly Ext: no edema Skin: warm and dry Neuro:  CNs 2-12 intact, no focal abnormalities noted  EKG:  NSR, HR 83, normal axis, PVC, no ST changes     ASSESSMENT AND PLAN:  1. CAD:  The patient has anginal symptoms with emotional stress and a recent abnormal Myoview with a hx of early graft failure s/p PCI to native RCA in 2013.  Dr. [name] has recommended proceeding with cardiac cath and he was brought in today to discuss the procedure.  Risks and benefits of cardiac catheterization have been discussed with the patient.  These include bleeding, infection, kidney damage, stroke, heart attack, death.  The patient understands these risks and is willing to proceed.  Continue aspirin, Plavix, statin, beta blocker. 2. Hypertension: Controlled. 3. Dyslipidemia: Continue statin. 4. Disposition: Proceed with cardiac catheterization this week with Dr. Excell Seltzer as planned.  Signed, Tereso Newcomer, PA-C  01/10/2013 2:46 PM

## 2013-01-10 NOTE — H&P (Signed)
History and Physical   Date:  01/10/2013   ID:  Keith Flores, DOB December 06, 1960, MRN [id number]  PCP:  Lonia Blood, MD  Cardiologist:  Dr. [name]     History of Present Illness: Keith Flores is a 52 y.o. [place] male with a hx of CAD, s/p CABG in 2010, HTN, HL.  He had early graft closure and underwent extensive stenting of the native RCA.  LHC in 02/2011: S-PDA occluded, S-OM patent, S-D1 patent, L-LAD patent.  PCI: Promus DES x 2 to mid and dist RCA.  Echo (02/2011):  Mild LVH, EF 55%, Gr 2 DD, mild dilated ascending aorta (3.8 cm), trivial MR.  Last seen by Dr. [name] 11/2012.  He complained of angina with emotional stress.  LexiScan Myoview was done recently and noted to be abnormal.  He is brought in to arrange cardiac catheterization.    Since his stress test, he has not had any further chest pain.  He denies exertional symptoms.  He has only had chest pain with getting angry or stressed.  He denies significant dyspnea.  No orthopnea, PND, edema.  No syncope.    Lexiscan Myoview (01/05/2013): Impression  Exercise Capacity: Lexiscan with low level exercise.  BP Response: Hypertensive blood pressure response.  Clinical Symptoms: There is dyspnea and chest tightness.  ECG Impression: No significant ST segment change suggestive of ischemia.  Comparison with Prior Nuclear Study: Compared to study of 03/04/11, inferior ischemia is less severe.  Overall Impression: Low risk stress nuclear study with a moderate size, medium intensity, partially reversible inferior defect consistent with inferior thinning vs prior infarct and mild inferior ischemia.  LV Ejection Fraction: 52%. LV Wall Motion: NL LV Function; NL Wall Motion  Recent Labs: 01/04/2013: ALT 40; Creatinine 0.9; HDL 31.60*; LDL (calc) 76; Potassium 3.6; TSH 1.79   Wt Readings from Last 3 Encounters:  01/10/13 213 lb (96.616 kg)  01/04/13 212 lb (96.163 kg)  12/15/12 212 lb (96.163 kg)      Past Medical History  Diagnosis Date  . Hypertension   . Diabetes mellitus   . Asthma   . Hyperlipidemia   . Coronary artery disease     CABG 12/12 (LIMA-LAD, SVG-DX, SVG-OM, SVG-PDA) c/b post op AFib Rx with amio Rx. s/p successful PCI of the right coronary artery utilizing overlapping drug-eluting stents in the mid and distal vessel (done after abnormal nuc)  . Chest pain     01/2011 in setting of HTN urgency: Chest CT demonstrated no pulmonary embolus, postoperative appearance following CABG and small left pleural effusion and dependent atelectasis in the left lung  . Elevated hemoglobin     Current Outpatient Prescriptions  Medication Sig Dispense Refill  . amLODipine (NORVASC) 10 MG tablet Take 1 tablet (10 mg total) by mouth daily.  90 tablet  4  . aspirin 81 MG tablet Take 81 mg by mouth daily.      . carvedilol (COREG) 25 MG tablet Take 1 tablet (25 mg total) by mouth 2 (two) times daily.  180 tablet  3  . clopidogrel (PLAVIX) 75 MG tablet Take 1 tablet (75 mg total) by mouth daily.  90 tablet  4  . enalapril (VASOTEC) 10 MG tablet Take 1 tablet (10 mg total) by mouth 2 (two) times daily.  180 tablet  4  . hydrochlorothiazide (HYDRODIURIL) 25 MG tablet Take 1 tablet (25 mg total) by mouth daily.  90 tablet  3  . metFORMIN (GLUCOPHAGE) 1000 MG tablet  Take 1 tablet (1,000 mg total) by mouth daily after lunch.  30 tablet  1  . nitroGLYCERIN (NITROSTAT) 0.4 MG SL tablet Place 1 tablet (0.4 mg total) under the tongue every 5 (five) minutes as needed. For chest pain  25 tablet  3  . potassium chloride SA (K-DUR,KLOR-CON) 20 MEQ tablet Take 1 tablet (20 mEq total) by mouth as needed.  90 tablet  4  . pravastatin (PRAVACHOL) 40 MG tablet Take 1 tablet (40 mg total) by mouth every evening.  90 tablet  4   No current facility-administered medications for this visit.    Allergies:   Review of patient's allergies indicates no known allergies.   Social History:  The patient  reports that  he has been smoking.  He has never used smokeless tobacco. He reports that he does not drink alcohol or use illicit drugs.   Family History:  The patient's family history is negative for Anesthesia problems, Hypotension, Malignant hyperthermia, and Pseudochol deficiency.   ROS:  Please see the history of present illness.    All other systems reviewed and negative.   PHYSICAL EXAM: VS:  BP 130/78  Pulse 83  Ht 5\' 8"  (1.727 m)  Wt 213 lb (96.616 kg)  BMI 32.39 kg/m2 Well nourished, well developed, in no acute distress HEENT: normal Neck: no JVD Cardiac:  normal S1, S2; RRR; no murmur Lungs:  clear to auscultation bilaterally, no wheezing, rhonchi or rales Abd: soft, nontender, no hepatomegaly Ext: no edema Skin: warm and dry Neuro:  CNs 2-12 intact, no focal abnormalities noted  EKG:  NSR, HR 83, normal axis, PVC, no ST changes     ASSESSMENT AND PLAN:  1. CAD:  The patient has anginal symptoms with emotional stress and a recent abnormal Myoview with a hx of early graft failure s/p PCI to native RCA in 2013.  Dr. [name] has recommended proceeding with cardiac cath and he was brought in today to discuss the procedure.  Risks and benefits of cardiac catheterization have been discussed with the patient.  These include bleeding, infection, kidney damage, stroke, heart attack, death.  The patient understands these risks and is willing to proceed.  Continue aspirin, Plavix, statin, beta blocker. 2. Hypertension: Controlled. 3. Dyslipidemia: Continue statin. 4. Disposition: Proceed with cardiac catheterization this week with Dr. Excell Seltzer as planned.  Signed, Tereso Newcomer, PA-C  01/10/2013 2:46 PM

## 2013-01-11 ENCOUNTER — Telehealth: Payer: Self-pay | Admitting: *Deleted

## 2013-01-11 DIAGNOSIS — D582 Other hemoglobinopathies: Secondary | ICD-10-CM

## 2013-01-11 LAB — CBC WITH DIFFERENTIAL/PLATELET
Basophils Relative: 0.5 % (ref 0.0–3.0)
Eosinophils Relative: 5.7 % — ABNORMAL HIGH (ref 0.0–5.0)
HCT: 57.7 % — ABNORMAL HIGH (ref 39.0–52.0)
Hemoglobin: 19.3 g/dL (ref 13.0–17.0)
Lymphs Abs: 2.4 10*3/uL (ref 0.7–4.0)
MCV: 88.8 fl (ref 78.0–100.0)
Monocytes Absolute: 0.6 10*3/uL (ref 0.1–1.0)
Monocytes Relative: 7.4 % (ref 3.0–12.0)
Neutro Abs: 4.1 10*3/uL (ref 1.4–7.7)
Platelets: 196 10*3/uL (ref 150.0–400.0)
RBC: 6.5 Mil/uL — ABNORMAL HIGH (ref 4.22–5.81)
WBC: 7.4 10*3/uL (ref 4.5–10.5)

## 2013-01-11 LAB — PROTIME-INR
INR: 1.1 ratio — ABNORMAL HIGH (ref 0.8–1.0)
Prothrombin Time: 12 s (ref 10.2–12.4)

## 2013-01-11 LAB — BASIC METABOLIC PANEL
BUN: 16 mg/dL (ref 6–23)
Chloride: 97 mEq/L (ref 96–112)
Potassium: 4 mEq/L (ref 3.5–5.1)
Sodium: 138 mEq/L (ref 135–145)

## 2013-01-11 NOTE — Telephone Encounter (Signed)
lmptcb for lab results and referral to hematology, referral order placed today.

## 2013-01-12 ENCOUNTER — Encounter (HOSPITAL_COMMUNITY)
Admission: RE | Disposition: A | Discharge status: Home / Self Care | Payer: Self-pay | Source: Ambulatory Visit | Attending: Cardiovascular Disease

## 2013-01-12 ENCOUNTER — Ambulatory Visit (HOSPITAL_COMMUNITY)
Admission: RE | Admit: 2013-01-12 | Discharge: 2013-01-12 | Disposition: A | Discharge status: Home / Self Care | Payer: Medicaid Other | Source: Ambulatory Visit | Attending: Cardiovascular Disease | Admitting: Cardiovascular Disease

## 2013-01-12 DIAGNOSIS — Z9861 Coronary angioplasty status: Secondary | ICD-10-CM | POA: Insufficient documentation

## 2013-01-12 DIAGNOSIS — I200 Unstable angina: Secondary | ICD-10-CM | POA: Insufficient documentation

## 2013-01-12 DIAGNOSIS — I10 Essential (primary) hypertension: Secondary | ICD-10-CM | POA: Insufficient documentation

## 2013-01-12 DIAGNOSIS — E119 Type 2 diabetes mellitus without complications: Secondary | ICD-10-CM | POA: Insufficient documentation

## 2013-01-12 DIAGNOSIS — F17200 Nicotine dependence, unspecified, uncomplicated: Secondary | ICD-10-CM | POA: Insufficient documentation

## 2013-01-12 DIAGNOSIS — Z7982 Long term (current) use of aspirin: Secondary | ICD-10-CM | POA: Insufficient documentation

## 2013-01-12 DIAGNOSIS — I25810 Atherosclerosis of coronary artery bypass graft(s) without angina pectoris: Secondary | ICD-10-CM | POA: Insufficient documentation

## 2013-01-12 DIAGNOSIS — Z7902 Long term (current) use of antithrombotics/antiplatelets: Secondary | ICD-10-CM | POA: Insufficient documentation

## 2013-01-12 DIAGNOSIS — R9439 Abnormal result of other cardiovascular function study: Secondary | ICD-10-CM | POA: Insufficient documentation

## 2013-01-12 DIAGNOSIS — I2510 Atherosclerotic heart disease of native coronary artery without angina pectoris: Secondary | ICD-10-CM

## 2013-01-12 DIAGNOSIS — E785 Hyperlipidemia, unspecified: Secondary | ICD-10-CM | POA: Insufficient documentation

## 2013-01-12 DIAGNOSIS — I208 Other forms of angina pectoris: Secondary | ICD-10-CM

## 2013-01-12 HISTORY — PX: LEFT HEART CATHETERIZATION WITH CORONARY ANGIOGRAM: SHX5451

## 2013-01-12 LAB — GLUCOSE, CAPILLARY: Glucose-Capillary: 139 mg/dL — ABNORMAL HIGH (ref 70–99)

## 2013-01-12 SURGERY — LEFT HEART CATHETERIZATION WITH CORONARY ANGIOGRAM
Anesthesia: LOCAL

## 2013-01-12 MED ORDER — SODIUM CHLORIDE 0.9 % IJ SOLN: 3.0000 mL | Freq: Two times a day (BID) | INTRAMUSCULAR | Status: DC

## 2013-01-12 MED ORDER — ASPIRIN 81 MG PO CHEW
81.0000 mg | CHEWABLE_TABLET | ORAL | Status: AC
  Administered 2013-01-12: 81 mg via ORAL

## 2013-01-12 MED ORDER — MIDAZOLAM HCL 2 MG/2ML IJ SOLN
INTRAMUSCULAR | Status: AC
  Filled 2013-01-12: qty 2

## 2013-01-12 MED ORDER — FENTANYL CITRATE 0.05 MG/ML IJ SOLN
INTRAMUSCULAR | Status: AC
  Filled 2013-01-12: qty 2

## 2013-01-12 MED ORDER — SODIUM CHLORIDE 0.9 % IV SOLN: 1.0000 mL/kg/h | INTRAVENOUS | Status: DC

## 2013-01-12 MED ORDER — LIDOCAINE HCL (PF) 1 % IJ SOLN
INTRAMUSCULAR | Status: AC
  Filled 2013-01-12: qty 30

## 2013-01-12 MED ORDER — ONDANSETRON HCL 4 MG/2ML IJ SOLN: 4.0000 mg | Freq: Four times a day (QID) | INTRAMUSCULAR | Status: DC | PRN

## 2013-01-12 MED ORDER — HEPARIN (PORCINE) IN NACL 2-0.9 UNIT/ML-% IJ SOLN
INTRAMUSCULAR | Status: AC
  Filled 2013-01-12: qty 1500

## 2013-01-12 MED ORDER — VERAPAMIL HCL 2.5 MG/ML IV SOLN
INTRAVENOUS | Status: AC
  Filled 2013-01-12: qty 2

## 2013-01-12 MED ORDER — SODIUM CHLORIDE 0.9 % IV SOLN
INTRAVENOUS | Status: DC
  Administered 2013-01-12: 07:00:00 via INTRAVENOUS

## 2013-01-12 MED ORDER — SODIUM CHLORIDE 0.9 % IJ SOLN: 3.0000 mL | INTRAMUSCULAR | Status: DC | PRN

## 2013-01-12 MED ORDER — ASPIRIN 81 MG PO CHEW
CHEWABLE_TABLET | ORAL | Status: AC
  Filled 2013-01-12: qty 1

## 2013-01-12 MED ORDER — SODIUM CHLORIDE 0.9 % IV SOLN: 250.0000 mL | INTRAVENOUS | Status: DC | PRN

## 2013-01-12 MED ORDER — ACETAMINOPHEN 325 MG PO TABS: 650.0000 mg | ORAL_TABLET | ORAL | Status: DC | PRN

## 2013-01-12 NOTE — CV Procedure (Signed)
Cardiac Catheterization Procedure Note  Name: Keith Flores MRN: [id number] DOB: [birth year]  Procedure: Left Heart Cath, Selective Coronary Angiography, LV angiography, SVG angiography  Indication: 52 year old gentleman with previous bypass surgery. He had early graft failure involving the saphenous vein graft to PDA and underwent stenting of the native right coronary artery approximately 2 years ago. He presents now with progressive symptoms of angina, primarily associated with emotional stress. His Myoview scan demonstrated inferior infarct with peri-infarct ischemia. He was referred for cardiac catheterization and possible PCI.   Procedural Details: The right wrist was prepped, draped, and anesthetized with 1% lidocaine. Using the modified Seldinger technique, a 5/6 French sheath was introduced into the right radial artery. 3 mg of verapamil was administered through the sheath, weight-based unfractionated heparin was administered intravenously. Standard Judkins catheters were used for selective coronary angiography and left ventriculography. And AL2 catheter was used for saphenous vein graft angiography. I attempted to access the left subclavian artery with a JR 4, JL 3.5, and TIG catheter. I was unable to selectively engage the subclavian with any of these catheters. An angled Glidewire was attempted. Ultimately a nonselective image of the aortic arch was obtained using a pigtail catheter. The left subclavian was clearly patent and the LIMA was poorly visualized. Considering competitive brisk flow seen in the native LAD and previous demonstration of widely patent LIMA, I felt it was clear the vessel remained patent and no further attempts were made. Catheter exchanges were performed over an exchange length guidewire. There were no immediate procedural complications. A TR band was used for radial hemostasis at the completion of the procedure.  The patient was transferred to the post  catheterization recovery area for further monitoring.  Procedural Findings: Hemodynamics: AO 133/76 LV 132/6  Coronary angiography: Coronary dominance: right  Left mainstem: Patent without obstructive disease. Arises from the left cusp.  Left anterior descending (LAD): The LAD has moderate 50-60% proximal vessel stenosis. There is severe stenosis of at least 80% at the first septal perforator. There is brisk competitive filling from the mammary graft beyond that. The first diagonal has severe 80% stenosis.  Left circumflex (LCx): The circumflex is seen to give off a single OM branch with diffuse 75% stenosis in the vault with tandem lesions. The AV circumflex is patent and supplies a small PLA branch. There is an OM branch there is not visualized and is known to be totally occluded.  Saphenous vein graft angiography: The saphenous vein graft obtuse marginal is patent with no stenosis identified. The OM vessel divides into twin vessels in its midportion.  Saphenous vein graft to first diagonal is patent with mild irregularity throughout.  The LIMA graft is poorly visualized. The left subclavian artery is patent. There is brisk competitive filling from the LIMA into the native LAD.  Right coronary artery (RCA): The native RCA is patent. This is a dominant vessel. There is 40-50% proximal stenosis. The long stented segment in the mid and distal vessel is widely patent with no evidence of restenosis. There is a very large PDA branch with no significant stenosis. There is a small PLA with mild disease noted.  Left ventriculography: Left ventricular systolic function is normal, LVEF is estimated at 55%, there is no significant mitral regurgitation   Final Conclusions:   1. Severe native three-vessel coronary artery disease with continued patency of the native RCA stented segment, severe stenosis of the LAD with brisk filling from the mammary graft, and continued patency of the  saphenous vein  graft to left circumflex distribution  2. Status post aortocoronary bypass surgery with known occlusion of the saphenous vein graft to PDA, patency of the vein graft to OM and diagonal, and continued patency of the LIMA to LAD  3. Preserved left ventricular systolic function  Recommendations: Continued medical therapy. The patient has stable coronary anatomy.  [name] 01/12/2013, 9:30 AM

## 2013-01-12 NOTE — Interval H&P Note (Signed)
Cath Lab Visit (complete for each Cath Lab visit)  Clinical Evaluation Leading to the Procedure:   ACS: no  Non-ACS:    Anginal Classification: CCS II  Anti-ischemic medical therapy: Maximal Therapy (2 or more classes of medications)  Non-Invasive Test Results: Low-risk stress test findings: cardiac mortality <1%/year  Prior CABG: Previous CABG      History and Physical Interval Note:  01/12/2013 9:28 AM  Keith Flores  has presented today for surgery, with the diagnosis of Chest pain  The various methods of treatment have been discussed with the patient and family. After consideration of risks, benefits and other options for treatment, the patient has consented to  Procedure(s): LEFT HEART CATHETERIZATION WITH CORONARY ANGIOGRAM (N/A) as a surgical intervention .  The patient's history has been reviewed, patient examined, no change in status, stable for surgery.  I have reviewed the patient's chart and labs.  Questions were answered to the patient's satisfaction.     [name]

## 2013-01-14 ENCOUNTER — Telehealth: Payer: Self-pay | Admitting: Internal Medicine

## 2013-01-14 NOTE — Telephone Encounter (Signed)
LVOM FOR PT TO RETURN CALL IN RE TO REFERRAL.  °

## 2013-01-14 NOTE — Telephone Encounter (Signed)
C/D 01/14/13 for appt. 01/17/13

## 2013-01-14 NOTE — Telephone Encounter (Signed)
S/W PT AND GVE NP APPT  12/29 @ 1:30 W/DR. CHISM REFERRING DR. [name] WEAVER DX- ELEVATED HGB WELCOME PACKET MAILED.

## 2013-01-17 ENCOUNTER — Encounter: Payer: Self-pay | Admitting: Internal Medicine

## 2013-01-17 ENCOUNTER — Ambulatory Visit: Payer: Medicaid Other

## 2013-01-17 ENCOUNTER — Other Ambulatory Visit: Payer: Self-pay | Admitting: Internal Medicine

## 2013-01-17 ENCOUNTER — Telehealth: Payer: Self-pay | Admitting: *Deleted

## 2013-01-17 ENCOUNTER — Ambulatory Visit (HOSPITAL_BASED_OUTPATIENT_CLINIC_OR_DEPARTMENT_OTHER): Payer: Medicaid Other | Admitting: Internal Medicine

## 2013-01-17 ENCOUNTER — Other Ambulatory Visit (HOSPITAL_BASED_OUTPATIENT_CLINIC_OR_DEPARTMENT_OTHER): Payer: Medicaid Other

## 2013-01-17 VITALS — BP 128/76 | HR 92 | Temp 97.7°F | Resp 20 | Ht 67.25 in | Wt 213.4 lb

## 2013-01-17 DIAGNOSIS — D45 Polycythemia vera: Secondary | ICD-10-CM

## 2013-01-17 DIAGNOSIS — D751 Secondary polycythemia: Secondary | ICD-10-CM

## 2013-01-17 DIAGNOSIS — I10 Essential (primary) hypertension: Secondary | ICD-10-CM

## 2013-01-17 DIAGNOSIS — E119 Type 2 diabetes mellitus without complications: Secondary | ICD-10-CM

## 2013-01-17 DIAGNOSIS — Z7982 Long term (current) use of aspirin: Secondary | ICD-10-CM

## 2013-01-17 LAB — CBC & DIFF AND RETIC
BASO%: 0.3 % (ref 0.0–2.0)
Basophils Absolute: 0 10*3/uL (ref 0.0–0.1)
EOS%: 7.9 % — ABNORMAL HIGH (ref 0.0–7.0)
Eosinophils Absolute: 0.6 10*3/uL — ABNORMAL HIGH (ref 0.0–0.5)
HCT: 53.6 % — ABNORMAL HIGH (ref 38.4–49.9)
HGB: 18.9 g/dL — ABNORMAL HIGH (ref 13.0–17.1)
LYMPH%: 30.3 % (ref 14.0–49.0)
MCH: 30.7 pg (ref 27.2–33.4)
MCHC: 35.3 g/dL (ref 32.0–36.0)
MCV: 87 fL (ref 79.3–98.0)
MONO%: 5.5 % (ref 0.0–14.0)
NEUT#: 3.9 10*3/uL (ref 1.5–6.5)
NEUT%: 56 % (ref 39.0–75.0)
Platelets: 168 10*3/uL (ref 140–400)
RDW: 13.7 % (ref 11.0–14.6)
Retic Ct Abs: 126.28 10*3/uL — ABNORMAL HIGH (ref 34.80–93.90)

## 2013-01-17 LAB — COMPREHENSIVE METABOLIC PANEL (CC13)
ALT: 37 U/L (ref 0–55)
AST: 17 U/L (ref 5–34)
Alkaline Phosphatase: 73 U/L (ref 40–150)
Anion Gap: 11 mEq/L (ref 3–11)
CO2: 25 mEq/L (ref 22–29)
Sodium: 138 mEq/L (ref 136–145)
Total Bilirubin: 1.51 mg/dL — ABNORMAL HIGH (ref 0.20–1.20)
Total Protein: 7.4 g/dL (ref 6.4–8.3)

## 2013-01-17 LAB — TSH CHCC: TSH: 0.786 m(IU)/L (ref 0.320–4.118)

## 2013-01-17 NOTE — Patient Instructions (Signed)
Polycythemia Vera  Polycythemia Vera is a condition in which the body makes too many red blood cells and there is no known cause. The red blood cells (erythrocytes) are the cells which carry the oxygen in your blood stream to the cells of your body. Because of the increased red blood cells, the blood becomes thicker and does not circulate as well. It would be similar to your car having oil which is too thick so it cannot start and circulate as well. When the blood is too thick it often causes headaches and dizziness. It may also cause blood clots. Even though the blood clots easier, these patients bleed easier. The bleeding is caused because the blood cells which help stop bleeding (platelets) do not function normally. It occurs in all age groups but is more common in the 50 to 70 year age range. TREATMENT  The treatment of polycythemia vera for many years has been blood removal (phlebotomy) which is similar to blood removal in a blood bank, however this blood is not used for donation. Hydroxyurea is used to supplement phlebotomy. Aspirin is commonly given to thin the blood as long as the patient does not have a problem with bleeding. Other drugs are used based on the progression of the disease. Document Released: 10/01/2000 Document Revised: 03/31/2011 Document Reviewed: 04/07/2008 [employer] Patient Information 2014 [employer]  Therapeutic Phlebotomy Therapeutic phlebotomy is the controlled removal of blood from your body for the purpose of treating a medical condition. It is similar to donating blood. Usually, about a pint (470 mL) of blood is removed. The average adult has 9 to 12 pints (4.3 to 5.7 L) of blood. Therapeutic phlebotomy may be used to treat the following medical conditions:  Hemochromatosis. This is a condition in which there is too much iron in the blood.  Polycythemia vera. This is a condition in which there are too many red cells in the blood.  Porphyria cutanea tarda. This is  a disease usually passed from one generation to the next (inherited). It is a condition in which an important part of hemoglobin is not made properly. This results in the build up of abnormal amounts of porphyrins in the body.  Sickle cell disease. This is an inherited disease. It is a condition in which the red blood cells form an abnormal crescent shape rather than a round shape. LET YOUR CAREGIVER KNOW ABOUT:  Allergies.  Medicines taken including herbs, eyedrops, over-the-counter medicines, and creams.  Use of steroids (by mouth or creams).  Previous problems with anesthetics or numbing medicine.  History of blood clots.  History of bleeding or blood problems.  Previous surgery.  Possibility of pregnancy, if this applies. RISKS AND COMPLICATIONS This is a simple and safe procedure. Problems are unlikely. However, problems can occur and may include:  Nausea or lightheadedness.  Low blood pressure.  Soreness, bleeding, swelling, or bruising at the needle insertion site.  Infection. BEFORE THE PROCEDURE  This is a procedure that can be done as an outpatient. Confirm the time that you need to arrive for your procedure. Confirm whether there is a need to fast or withhold any medications. It is helpful to wear clothing with sleeves that can be raised above the elbow. A blood sample may be done to determine the amount of red blood cells or iron in your blood. Plan ahead of time to have someone drive you home after the procedure. PROCEDURE The entire procedure from preparation through recovery takes about 1 hour. The actual   collection takes about 10 to 15 minutes.  A needle will be inserted into your vein.  Tubing and a collection bag will be attached to that needle.  Blood will flow through the needle and tubing into the collection bag.  You may be asked to open and close your hand slowly and continuously during the entire collection.  Once the specified amount of blood has  been removed from your body, the collection bag and tubing will be clamped.  The needle will be removed.  Pressure will be held on the site of the needle insertion to stop the bleeding. Then a bandage will be placed over the needle insertion site. AFTER THE PROCEDURE  Your recovery will be assessed and monitored. If there are no problems, as an outpatient, you should be able to go home shortly after the procedure.  Document Released: 06/10/2010 Document Revised: 03/31/2011 Document Reviewed: 06/10/2010 [employer] Patient Information 2014 [employer]  

## 2013-01-17 NOTE — Progress Notes (Signed)
Checked in new patient with no financial issues. She has appt crd. °

## 2013-01-17 NOTE — Telephone Encounter (Signed)
appts made and printed...td 

## 2013-01-18 ENCOUNTER — Telehealth: Payer: Self-pay | Admitting: *Deleted

## 2013-01-18 ENCOUNTER — Other Ambulatory Visit: Payer: Self-pay | Admitting: Cardiovascular Disease

## 2013-01-18 LAB — ERYTHROPOIETIN: Erythropoietin: 13.7 m[IU]/mL (ref 2.6–18.5)

## 2013-01-18 NOTE — Telephone Encounter (Signed)
Per staff message and POF I have scheduled appts.  JMW  

## 2013-01-18 NOTE — Progress Notes (Signed)
[facility] [phone]   [phone]  NEW PATIENT EVALUATION   Name: Keith Flores Date: 01/18/2013 MRN: [id number] DOB: 1960-10-06  PCP: Lonia Blood, MD    REFERRING PHYSICIAN: Rometta Emery, MD  REASON FOR REFERRAL: Erythrocytosis NOS  HISTORY OF PRESENT ILLNESS:Keith Flores is a 52 y.o. male who is here for further evaluation for erythrocytosis.   He is accompanied by his arabic interpreter. He has an extensive cardiac history including CAD s/p CABG in 2010, Hypertension and hyperlipidemia for which he is being managed by cardiology.  He had a lexiscan myoview recently due to test discomfort which was revealed the following: Low risk stress nuclear study with a moderate size, medium intensity, partially reversible inferior defect consistent with inferior thinning vs prior infarct and mild inferior ischemia.  LV Ejection Fraction: 52%. LV Wall Motion: NL LV Function; NL Wall Motion. He was last seen by Dr. [name] 12/2012.   He denies being told he had an elevated hemoglobin on prior CBCs.   Looking through his lab values, he has had significant fluctuations with his hct being 53.6 today to as low as 39 on 12/30/2010 to as high as 57.7 on 01/10/2013.  He also de He denies feeling very itchy after taking a shower or bathing. He denies burning pain in his feet or cyanosis or discolorations. He also denies chest and abdominal pain, myalgia and weakness, fatigue, headache, blurred vision, transient loss of vision, paresthesias, slow mentation. He denies a history of blood clots or gout.  He smokes about 6 cigarettes daily and smoked for many years prior.  He denies any dysuria or hematuria or weight changes.  He denies living in high altitudes or the use of testosterone.    PAST MEDICAL HISTORY:  has a past medical history of Hypertension; Diabetes mellitus; Asthma; Hyperlipidemia; Coronary artery disease; Chest pain; and Elevated  hemoglobin.     PAST SURGICAL HISTORY: Past Surgical History  Procedure Laterality Date  . Cardiac catheterization    . Coronary artery bypass graft  12/30/2010    Procedure: CORONARY ARTERY BYPASS GRAFTING (CABG);  Surgeon: Delight Ovens, MD;  Location: [facility] OR;  Service: Open Heart Surgery;  Laterality: N/A;     CURRENT MEDICATIONS: has a current medication list which includes the following prescription(s): amlodipine, aspirin, carvedilol, clopidogrel, enalapril, hydrochlorothiazide, metformin, nitroglycerin, potassium chloride sa, and pravastatin.   ALLERGIES: Review of patient's allergies indicates no known allergies.   SOCIAL HISTORY:  reports that he has been smoking.  He has never used smokeless tobacco. He reports that he does not drink alcohol or use illicit drugs.   FAMILY HISTORY: family history is negative for Anesthesia problems, Hypotension, Malignant hyperthermia, and Pseudochol deficiency.   LABORATORY DATA:  Results for orders placed in visit on 01/17/13 (from the past 48 hour(s))  COMPREHENSIVE METABOLIC PANEL (CC13)     Status: Abnormal   Collection Time    01/17/13  1:28 PM      Result Value Range   Sodium 138  136 - 145 mEq/L   Potassium 4.0  3.5 - 5.1 mEq/L   Chloride 102  98 - 109 mEq/L   CO2 25  22 - 29 mEq/L   Glucose 242 (*) 70 - 140 mg/dl   BUN 14.7  7.0 - 82.9 mg/dL   Creatinine 1.0  0.7 - 1.3 mg/dL   Total Bilirubin 5.62 (*) 0.20 - 1.20 mg/dL   Alkaline Phosphatase 73  40 -  150 U/L   AST 17  5 - 34 U/L   ALT 37  0 - 55 U/L   Total Protein 7.4  6.4 - 8.3 g/dL   Albumin 4.0  3.5 - 5.0 g/dL   Calcium 9.7  8.4 - 47.8 mg/dL   Anion Gap 11  3 - 11 mEq/L  [facility] SMEAR     Status: None   Collection Time    01/17/13  1:28 PM      Result Value Range   Smear Result Smear Available    CBC & DIFF AND RETIC     Status: Abnormal   Collection Time    01/17/13  1:28 PM      Result Value Range   WBC 7.0  4.0 - 10.3 10e3/uL   NEUT# 3.9  1.5 - 6.5  10e3/uL   HGB 18.9 (*) 13.0 - 17.1 g/dL   HCT 29.5 (*) 62.1 - 30.8 %   Platelets 168  140 - 400 10e3/uL   MCV 87.0  79.3 - 98.0 fL   MCH 30.7  27.2 - 33.4 pg   MCHC 35.3  32.0 - 36.0 g/dL   RBC 6.57 (*) 8.46 - 9.62 10e6/uL   RDW 13.7  11.0 - 14.6 %   lymph# 2.1  0.9 - 3.3 10e3/uL   MONO# 0.4  0.1 - 0.9 10e3/uL   Eosinophils Absolute 0.6 (*) 0.0 - 0.5 10e3/uL   Basophils Absolute 0.0  0.0 - 0.1 10e3/uL   NEUT% 56.0  39.0 - 75.0 %   LYMPH% 30.3  14.0 - 49.0 %   MONO% 5.5  0.0 - 14.0 %   EOS% 7.9 (*) 0.0 - 7.0 %   BASO% 0.3  0.0 - 2.0 %   Retic % 2.05 (*) 0.80 - 1.80 %   Retic Ct Abs 126.28 (*) 34.80 - 93.90 10e3/uL   Immature Retic Fract 5.40  3.00 - 10.60 %  TSH [facility]     Status: None   Collection Time    01/17/13  1:28 PM      Result Value Range   TSH 0.786  0.320 - 4.118 m(IU)/L  VITAMIN B12     Status: Abnormal   Collection Time    01/17/13  1:28 PM      Result Value Range   Vitamin B-12 1355 (*) 211 - 911 pg/mL  ERYTHROPOIETIN     Status: None   Collection Time    01/17/13  1:28 PM      Result Value Range   Erythropoietin 13.7  2.6 - 18.5 mIU/mL   Comment:  Because of diurnal variations in Erythropoietin levels, it isimportant to collect samples at a consistent time of day.  Morningsamples collected between 7:30 AM and 12:00 noon are recommended.        Ref. Range 01/10/2013 16:01 01/17/2013 13:28  Hemoglobin Latest Range: 13.0-17.0 g/dL 95.2 cH (HH) 84.1 (H)    RADIOGRAPHY: No results found.     REVIEW OF SYSTEMS:  Constitutional: Denies fevers, chills or abnormal weight loss Eyes: Denies blurriness of vision Ears, nose, mouth, throat, and face: Denies mucositis or sore throat Respiratory: Denies cough, dyspnea or wheezes Cardiovascular: Denies palpitation, chest discomfort or lower extremity swelling Gastrointestinal:  Denies nausea, heartburn or change in bowel habits Skin: Denies abnormal skin rashes Lymphatics: Denies new lymphadenopathy or easy  bruising Neurological:Denies numbness, tingling or new weaknesses Behavioral/Psych: Mood is stable, no new changes  All other systems were reviewed with the patient and are  negative.  PHYSICAL EXAM:  height is 5' 7.25" (1.708 m) and weight is 213 lb 6.4 oz (96.798 kg). His oral temperature is 97.7 F (36.5 C). His blood pressure is 128/76 and his pulse is 92. His respiration is 20.    GENERAL:alert, no distress and comfortable; moderately obese.  SKIN: skin color, texture, turgor are normal, no rashes or significant lesions EYES: normal, Conjunctiva are pink and non-injected, sclera clear OROPHARYNX:no exudate, no erythema and lips, buccal mucosa, and tongue normal  NECK: supple, thyroid normal size, non-tender, without nodularity LYMPH:  no palpable lymphadenopathy in the cervical, axillary or inguinal LUNGS: clear to auscultation and percussion with normal breathing effort HEART: regular rate & rhythm and no murmurs and no lower extremity edema ABDOMEN:abdomen soft, non-tender and normal bowel sounds; obese. No organomegaly appreciated.  Musculoskeletal:no cyanosis of digits and no clubbing  NEURO: alert & oriented x 3 with fluent speech, no focal motor/sensory deficits   IMPRESSION: Keith Flores is a 52 y.o. 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 He also has an elevated vitamin b12. CT of Abdomen (2008) was negative for splenomegaly. JAK2 pending. Normal EPO level. He denies classic aquagenic pruritus negative for erythromegalalia. Secondary causes due to RCC, HCC and steroids/testerone are felt to be less likely.   PLAN:  --Smear was reviewed by me and did not reveal abnormal white blood cells or blasts. Pulse oximetry and/or arterial oxygen saturation at rest and after brisk walk was 96%.   --He meets 2 major PCV study group criteria including an arterial oxygen greater  than or equal to 92% and hemoglobin greater than 18.5. In addition he meets one minor PCV criteria with vitamin B12 greater than 900.  Other minor PCV criteria including elevation of plt greater than 400,000 or WBC greater than 12,000 he did not meet.  Other criteria which includes a bone marrow biopsy and erythropoietin levels can also be used such as the revised WHO criteria. It requires elevated hbg as noted above plus a positive JAK2 and one of the following: findings on the bone marrow biopsy and a low EPO level. However, he has a normal EPO level (subnormal in nearly 80% of the cases) and does not have splenomegaly on physical exam (though limited due to obesity).  His spleen size was normal by CT of abdomen and pelvis on 04/12/2006.  In addition 95 to percent of people with polycythemia vera positive JAK2. We sent out for JAK2 testing.  A mutation in the  JAK2 ([id number]) is strongly associated with polycythemia vera.  --If positive JAK2, we will perform phlebetomy with a goal less than 45% for the Hematocrit based on the CYTO-PV trial ([place] R, NEJM 2013). One unit phlebetomy (500 mL) should drop the hematocrit by 3%. Based on his age less than 60 placing him at a lower thrombotic risk, we will hold the addition of  hydroxyurea daily as an adjunctive therapy. An overview of polycythemia was provided to him as handouts. We also discussed a bone marrow biopsy may be needed in the future.   -- I reiterated the importance of compliance with his daily baby aspirin (81 mg) daily.   2. Follow-up.  He should follow up with our clinic in two weeks to discuss the results of testing and consent for bone marrow biopsy if warranted.    All questions were answered. The patient knows to call the clinic with any problems,  questions or concerns. We can certainly see the patient much sooner if necessary.  I spent 40 minutes counseling the patient face to face with an Arabic intepreter. The total time spent in the  appointment was 60 minutes  .    Kesley Mullens, MD 01/18/2013 12:57 PM

## 2013-01-24 ENCOUNTER — Other Ambulatory Visit: Payer: Medicaid Other

## 2013-01-31 ENCOUNTER — Ambulatory Visit (HOSPITAL_BASED_OUTPATIENT_CLINIC_OR_DEPARTMENT_OTHER): Payer: Medicaid Other

## 2013-01-31 ENCOUNTER — Other Ambulatory Visit: Payer: Medicaid Other

## 2013-01-31 ENCOUNTER — Encounter: Payer: Self-pay | Admitting: Internal Medicine

## 2013-01-31 ENCOUNTER — Other Ambulatory Visit: Payer: Self-pay | Admitting: Medical Oncology

## 2013-01-31 ENCOUNTER — Other Ambulatory Visit (HOSPITAL_BASED_OUTPATIENT_CLINIC_OR_DEPARTMENT_OTHER): Payer: Medicaid Other

## 2013-01-31 ENCOUNTER — Ambulatory Visit (HOSPITAL_BASED_OUTPATIENT_CLINIC_OR_DEPARTMENT_OTHER): Payer: Medicaid Other | Admitting: Internal Medicine

## 2013-01-31 VITALS — BP 128/73 | HR 95 | Temp 98.2°F | Resp 19 | Ht 67.25 in | Wt 211.8 lb

## 2013-01-31 DIAGNOSIS — D751 Secondary polycythemia: Secondary | ICD-10-CM

## 2013-01-31 DIAGNOSIS — I2510 Atherosclerotic heart disease of native coronary artery without angina pectoris: Secondary | ICD-10-CM

## 2013-01-31 DIAGNOSIS — D45 Polycythemia vera: Secondary | ICD-10-CM

## 2013-01-31 DIAGNOSIS — I10 Essential (primary) hypertension: Secondary | ICD-10-CM

## 2013-01-31 DIAGNOSIS — Z951 Presence of aortocoronary bypass graft: Secondary | ICD-10-CM

## 2013-01-31 DIAGNOSIS — I4891 Unspecified atrial fibrillation: Secondary | ICD-10-CM

## 2013-01-31 LAB — CBC WITH DIFFERENTIAL/PLATELET
BASO%: 0.7 % (ref 0.0–2.0)
Basophils Absolute: 0 10*3/uL (ref 0.0–0.1)
EOS ABS: 0.5 10*3/uL (ref 0.0–0.5)
EOS%: 8.8 % — ABNORMAL HIGH (ref 0.0–7.0)
HCT: 57.9 % — ABNORMAL HIGH (ref 38.4–49.9)
HGB: 19.4 g/dL — ABNORMAL HIGH (ref 13.0–17.1)
LYMPH%: 35.7 % (ref 14.0–49.0)
MCH: 30 pg (ref 27.2–33.4)
MCHC: 33.5 g/dL (ref 32.0–36.0)
MCV: 89.5 fL (ref 79.3–98.0)
MONO#: 0.7 10*3/uL (ref 0.1–0.9)
MONO%: 11.9 % (ref 0.0–14.0)
NEUT%: 42.9 % (ref 39.0–75.0)
NEUTROS ABS: 2.4 10*3/uL (ref 1.5–6.5)
PLATELETS: 167 10*3/uL (ref 140–400)
RBC: 6.47 10*6/uL — AB (ref 4.20–5.82)
RDW: 14.5 % (ref 11.0–14.6)
WBC: 5.7 10*3/uL (ref 4.0–10.3)
lymph#: 2 10*3/uL (ref 0.9–3.3)

## 2013-01-31 NOTE — Patient Instructions (Signed)
Therapeutic Phlebotomy Therapeutic phlebotomy is the controlled removal of blood from your body for the purpose of treating a medical condition. It is similar to donating blood. Usually, about a pint (470 mL) of blood is removed. The average adult has 9 to 12 pints (4.3 to 5.7 L) of blood. Therapeutic phlebotomy may be used to treat the following medical conditions:  Hemochromatosis. This is a condition in which there is too much iron in the blood.  Polycythemia vera. This is a condition in which there are too many red cells in the blood.  Porphyria cutanea tarda. This is a disease usually passed from one generation to the next (inherited). It is a condition in which an important part of hemoglobin is not made properly. This results in the build up of abnormal amounts of porphyrins in the body.  Sickle cell disease. This is an inherited disease. It is a condition in which the red blood cells form an abnormal crescent shape rather than a round shape. LET YOUR CAREGIVER KNOW ABOUT:  Allergies.  Medicines taken including herbs, eyedrops, over-the-counter medicines, and creams.  Use of steroids (by mouth or creams).  Previous problems with anesthetics or numbing medicine.  History of blood clots.  History of bleeding or blood problems.  Previous surgery.  Possibility of pregnancy, if this applies. RISKS AND COMPLICATIONS This is a simple and safe procedure. Problems are unlikely. However, problems can occur and may include:  Nausea or lightheadedness.  Low blood pressure.  Soreness, bleeding, swelling, or bruising at the needle insertion site.  Infection. BEFORE THE PROCEDURE  This is a procedure that can be done as an outpatient. Confirm the time that you need to arrive for your procedure. Confirm whether there is a need to fast or withhold any medications. It is helpful to wear clothing with sleeves that can be raised above the elbow. A blood sample may be done to determine the  amount of red blood cells or iron in your blood. Plan ahead of time to have someone drive you home after the procedure. PROCEDURE The entire procedure from preparation through recovery takes about 1 hour. The actual collection takes about 10 to 15 minutes.  A needle will be inserted into your vein.  Tubing and a collection bag will be attached to that needle.  Blood will flow through the needle and tubing into the collection bag.  You may be asked to open and close your hand slowly and continuously during the entire collection.  Once the specified amount of blood has been removed from your body, the collection bag and tubing will be clamped.  The needle will be removed.  Pressure will be held on the site of the needle insertion to stop the bleeding. Then a bandage will be placed over the needle insertion site. AFTER THE PROCEDURE  Your recovery will be assessed and monitored. If there are no problems, as an outpatient, you should be able to go home shortly after the procedure.  Document Released: 06/10/2010 Document Revised: 03/31/2011 Document Reviewed: 06/10/2010 [employer] Patient Information 2014 [employer].  

## 2013-01-31 NOTE — Progress Notes (Signed)
Patient tolerated first time phlebotomy. Phlebotomized 500 mls from right AC. Patient had no complaints. Interpreter at side. Patient was observed 30 minutes post phlebotomy. He ate and drank. Denied dizziness and lightheadedness on discharge. VSS.

## 2013-02-02 ENCOUNTER — Telehealth: Payer: Self-pay | Admitting: Internal Medicine

## 2013-02-02 NOTE — Progress Notes (Signed)
[place], [place] [address]  DIAGNOSIS: Erythrocytosis - Plan: CBC with Differential, Comprehensive metabolic panel (Cmet) - [facility], Lactate dehydrogenase (LDH) - [facility], CBC with Differential, CANCELED: Phlebotomy therapeutic  Essential hypertension, benign  S/P CABG x 4  Coronary atherosclerosis of native coronary artery  Atrial fibrillation  Chief Complaint  Patient presents with  . erythrocytosis    CURRENT THERAPY: Phlebotomy to maintain hct less than 45%.   INTERVAL HISTORY: Keith Flores 53 y.o. male  who is here for further follow up for erythrocytosis. He is accompanied by his arabic interpreter. He has an extensive cardiac history including CAD s/p CABG in 2010, Hypertension and hyperlipidemia for which he is being managed by cardiology. He had a lexiscan myoview recently due to test discomfort which was revealed the following: Low risk stress nuclear study with a moderate size, medium intensity, partially reversible inferior defect consistent with inferior thinning vs prior infarct and mild inferior ischemia. LV Ejection Fraction: 52%. LV Wall Motion: NL LV Function; NL Wall Motion. He was last seen by Dr. Sherren Mocha 12/2012.   He denies being told he had an elevated hemoglobin on prior CBCs. Looking through his lab values, he has had significant fluctuations with his hct being 53.6 today to as low as 39 on 12/30/2010 to as high as 57.7 on 01/10/2013. He also de He denies feeling very itchy after taking a shower or bathing. He denies burning pain in his feet or cyanosis or discolorations. He also denies chest and abdominal pain, myalgia and weakness, fatigue, headache, blurred vision, transient loss of vision, paresthesias, slow mentation. He denies a history of blood clots or gout. He smokes about 6 cigarettes daily and smoked for many years prior. He denies any dysuria or hematuria or  weight changes. He denies living in high altitudes or the use of testosterone.    MEDICAL HISTORY: Past Medical History  Diagnosis Date  . Hypertension   . Diabetes mellitus   . Asthma   . Hyperlipidemia   . Coronary artery disease     CABG 12/12 (LIMA-LAD, SVG-DX, SVG-OM, SVG-PDA) c/b post op AFib Rx with amio Rx. s/p successful PCI of the right coronary artery utilizing overlapping drug-eluting stents in the mid and distal vessel (done after abnormal nuc)  . Chest pain     01/2011 in setting of HTN urgency: Chest CT demonstrated no pulmonary embolus, postoperative appearance following CABG and small left pleural effusion and dependent atelectasis in the left lung  . Elevated hemoglobin     INTERIM HISTORY: has S/P CABG x 4; Coronary atherosclerosis of native coronary artery; Essential hypertension, benign; Mixed hyperlipidemia; Atrial fibrillation; Chest pain; Hypertensive urgency; Diabetes mellitus; Palpitations; and Erythrocytosis on his problem list.    ALLERGIES:  has No Known Allergies.  MEDICATIONS: has a current medication list which includes the following prescription(s): amlodipine, aspirin, carvedilol, clopidogrel, enalapril, hydrochlorothiazide, metformin, nitroglycerin, potassium chloride sa, and pravastatin.  SURGICAL HISTORY:  Past Surgical History  Procedure Laterality Date  . Cardiac catheterization    . Coronary artery bypass graft  12/30/2010    Procedure: CORONARY ARTERY BYPASS GRAFTING (CABG);  Surgeon: Grace Isaac, MD;  Location: [facility];  Service: Open Heart Surgery;  Laterality: N/A;    REVIEW OF SYSTEMS:   Constitutional: Denies fevers, chills or abnormal weight loss Eyes: Denies blurriness of vision Ears, nose, mouth, throat, and face: Denies mucositis or sore throat Respiratory:  Denies cough, dyspnea or wheezes Cardiovascular: Denies palpitation, chest discomfort or lower extremity swelling Gastrointestinal:  Denies nausea, heartburn or change in  bowel habits Skin: Denies abnormal skin rashes Lymphatics: Denies new lymphadenopathy or easy bruising Neurological:Denies numbness, tingling or new weaknesses Behavioral/Psych: Mood is stable, no new changes  All other systems were reviewed with the patient and are negative.  PHYSICAL EXAMINATION: ECOG PERFORMANCE STATUS: 0 - Asymptomatic  Blood pressure 128/73, pulse 95, temperature 98.2 F (36.8 C), temperature source Oral, resp. rate 19, height 5' 7.25" (1.708 m), weight 211 lb 12.8 oz (96.072 kg).  GENERAL:alert, no distress and comfortable; moderately obese SKIN: skin color, texture, turgor are normal, no rashes or significant lesions EYES: normal, Conjunctiva are pink and non-injected, sclera clear OROPHARYNX:no exudate, no erythema and lips, buccal mucosa, and tongue normal  NECK: supple, thyroid normal size, non-tender, without nodularity LYMPH:  no palpable lymphadenopathy in the cervical, axillary or supraclavicular LUNGS: clear to auscultation and percussion with normal breathing effort HEART: regular rate & rhythm and no murmurs and no lower extremity edema ABDOMEN:abdomen soft, non-tender and normal bowel sounds Musculoskeletal:no cyanosis of digits and no clubbing  NEURO: alert & oriented x 3 with fluent speech, no focal motor/sensory deficits   LABORATORY DATA: Results for orders placed in visit on 01/31/13 (from the past 48 hour(s))  CBC WITH DIFFERENTIAL     Status: Abnormal   Collection Time    01/31/13  1:55 PM      Result Value Range   WBC 5.7  4.0 - 10.3 10e3/uL   NEUT# 2.4  1.5 - 6.5 10e3/uL   HGB 19.4 (*) 13.0 - 17.1 g/dL   HCT 57.9 (*) 38.4 - 49.9 %   Platelets 167  140 - 400 10e3/uL   MCV 89.5  79.3 - 98.0 fL   MCH 30.0  27.2 - 33.4 pg   MCHC 33.5  32.0 - 36.0 g/dL   RBC 6.47 (*) 4.20 - 5.82 10e6/uL   RDW 14.5  11.0 - 14.6 %   lymph# 2.0  0.9 - 3.3 10e3/uL   MONO# 0.7  0.1 - 0.9 10e3/uL   Eosinophils Absolute 0.5  0.0 - 0.5 10e3/uL   Basophils  Absolute 0.0  0.0 - 0.1 10e3/uL   NEUT% 42.9  39.0 - 75.0 %   LYMPH% 35.7  14.0 - 49.0 %   MONO% 11.9  0.0 - 14.0 %   EOS% 8.8 (*) 0.0 - 7.0 %   BASO% 0.7  0.0 - 2.0 %       Labs:  Lab Results  Component Value Date   WBC 5.7 01/31/2013   HGB 19.4* 01/31/2013   HCT 57.9* 01/31/2013   MCV 89.5 01/31/2013   PLT 167 01/31/2013   NEUTROABS 2.4 01/31/2013      Chemistry      Component Value Date/Time   NA 138 01/17/2013 1328   NA 138 01/10/2013 1601   K 4.0 01/17/2013 1328   K 4.0 01/10/2013 1601   CL 97 01/10/2013 1601   CO2 25 01/17/2013 1328   CO2 34* 01/10/2013 1601   BUN 15.9 01/17/2013 1328   BUN 16 01/10/2013 1601   CREATININE 1.0 01/17/2013 1328   CREATININE 1.0 01/10/2013 1601      Component Value Date/Time   CALCIUM 9.7 01/17/2013 1328   CALCIUM 10.0 01/10/2013 1601   ALKPHOS 73 01/17/2013 1328   ALKPHOS 65 01/04/2013 0754   AST 17 01/17/2013 1328   AST 22 01/04/2013 0754  ALT 37 01/17/2013 1328   ALT 40 01/04/2013 0754   BILITOT 1.51* 01/17/2013 1328   BILITOT 1.1 01/04/2013 0754      CBC:  Recent Labs Lab 01/31/13 1355  WBC 5.7  NEUTROABS 2.4  HGB 19.4*  HCT 57.9*  MCV 89.5  PLT 167   Studies:  No results found.   RADIOGRAPHIC STUDIES: No results found.  ASSESSMENT: Kerolos Eilene Ghazi Flores 53 y.o. male with a history of Erythrocytosis - Plan: CBC with Differential, Comprehensive metabolic panel (Cmet) - [facility], Lactate dehydrogenase (LDH) - [facility], CBC with Differential, CANCELED: Phlebotomy therapeutic  Essential hypertension, benign  S/P CABG x 4  Coronary atherosclerosis of native coronary artery  Atrial fibrillation   Leilan Antonieta Pert is a 53 y.o. 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 He also has an elevated vitamin b12. CT of Abdomen (2008) was negative for splenomegaly. JAK2 was negative. Normal EPO level. He denies  classic aquagenic pruritus negative for erythromegalalia. Secondary causes due to [place], [place] and steroids/testerone are felt to be less likely.   PLAN:  --Smear was reviewed by me and did not reveal abnormal white blood cells or blasts. Pulse oximetry and/or arterial oxygen saturation at rest and after brisk walk was 96%.  --He meets 2 major PCV study group criteria including an arterial oxygen greater than or equal to 92% and hemoglobin greater than 18.5. In addition he meets one minor PCV criteria with vitamin B12 greater than 900. Other minor PCV criteria including elevation of plt greater than 400,000 or WBC greater than 12,000 he did not meet. Other criteria which includes a bone marrow biopsy and erythropoietin levels can also be used such as the revised WHO criteria. It requires elevated hbg as noted above plus a positive JAK2 and one of the following: findings on the bone marrow biopsy and a low EPO level. However, he has a normal EPO level (subnormal in nearly 80% of the cases) and does not have splenomegaly on physical exam (though limited due to obesity). His spleen size was normal by CT of abdomen and pelvis on 04/12/2006. In addition 95 to percent of people with polycythemia vera positive JAK2. We sent out for JAK2 testing and found to be negative. 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  --Although his JAK2 is negative, we will perform phlebetomy with a goal less than 45% for the Hematocrit based on the CYTO-PV trial ([place] R, NEJM 2013). One unit phlebetomy (500 mL) should drop the hematocrit by 3%. Based on his age less than 61 placing him at a lower thrombotic risk, we will hold the addition of hydroxyurea daily as an adjunctive therapy. An overview of polycythemia was provided to him as handouts. We also discussed a bone marrow biopsy may be needed in the future.  -- I reiterated the importance of compliance with his daily baby aspirin (81 mg) daily.    2. Follow-up.  He should follow up with our clinic in one month for CBC and chemistries.   All questions were answered. The patient knows to call the clinic with any problems, questions or concerns. We can certainly see the patient much sooner if necessary.  I spent 15 minutes counseling the patient face to face. The total time spent in the appointment was 25 minutes.    Deara Bober, MD 02/02/2013 6:23 AM

## 2013-02-14 ENCOUNTER — Other Ambulatory Visit (HOSPITAL_BASED_OUTPATIENT_CLINIC_OR_DEPARTMENT_OTHER): Payer: Medicaid Other

## 2013-02-14 ENCOUNTER — Encounter (INDEPENDENT_AMBULATORY_CARE_PROVIDER_SITE_OTHER): Payer: Self-pay

## 2013-02-14 DIAGNOSIS — D751 Secondary polycythemia: Secondary | ICD-10-CM

## 2013-02-14 LAB — CBC WITH DIFFERENTIAL/PLATELET
BASO%: 0.7 % (ref 0.0–2.0)
Basophils Absolute: 0.1 10*3/uL (ref 0.0–0.1)
EOS ABS: 0.5 10*3/uL (ref 0.0–0.5)
EOS%: 5.8 % (ref 0.0–7.0)
HEMATOCRIT: 54.3 % — AB (ref 38.4–49.9)
HGB: 18.3 g/dL — ABNORMAL HIGH (ref 13.0–17.1)
LYMPH%: 32.4 % (ref 14.0–49.0)
MCH: 30.3 pg (ref 27.2–33.4)
MCHC: 33.7 g/dL (ref 32.0–36.0)
MCV: 90.1 fL (ref 79.3–98.0)
MONO#: 0.4 10*3/uL (ref 0.1–0.9)
MONO%: 5.7 % (ref 0.0–14.0)
NEUT%: 55.4 % (ref 39.0–75.0)
NEUTROS ABS: 4.3 10*3/uL (ref 1.5–6.5)
PLATELETS: 213 10*3/uL (ref 140–400)
RBC: 6.03 10*6/uL — ABNORMAL HIGH (ref 4.20–5.82)
RDW: 14.5 % (ref 11.0–14.6)
WBC: 7.8 10*3/uL (ref 4.0–10.3)
lymph#: 2.5 10*3/uL (ref 0.9–3.3)

## 2013-02-15 ENCOUNTER — Other Ambulatory Visit: Payer: Self-pay | Admitting: Cardiovascular Disease

## 2013-02-28 ENCOUNTER — Ambulatory Visit (HOSPITAL_BASED_OUTPATIENT_CLINIC_OR_DEPARTMENT_OTHER): Payer: Medicaid Other

## 2013-02-28 ENCOUNTER — Encounter: Payer: Self-pay | Admitting: Internal Medicine

## 2013-02-28 ENCOUNTER — Ambulatory Visit (HOSPITAL_BASED_OUTPATIENT_CLINIC_OR_DEPARTMENT_OTHER): Payer: Medicaid Other | Admitting: Internal Medicine

## 2013-02-28 ENCOUNTER — Other Ambulatory Visit (HOSPITAL_BASED_OUTPATIENT_CLINIC_OR_DEPARTMENT_OTHER): Payer: Medicaid Other

## 2013-02-28 ENCOUNTER — Telehealth: Payer: Self-pay | Admitting: Internal Medicine

## 2013-02-28 VITALS — BP 141/77 | HR 88 | Temp 99.0°F | Resp 18 | Ht 67.25 in | Wt 214.9 lb

## 2013-02-28 DIAGNOSIS — D45 Polycythemia vera: Secondary | ICD-10-CM

## 2013-02-28 DIAGNOSIS — I10 Essential (primary) hypertension: Secondary | ICD-10-CM

## 2013-02-28 DIAGNOSIS — D751 Secondary polycythemia: Secondary | ICD-10-CM

## 2013-02-28 DIAGNOSIS — I2510 Atherosclerotic heart disease of native coronary artery without angina pectoris: Secondary | ICD-10-CM

## 2013-02-28 DIAGNOSIS — E119 Type 2 diabetes mellitus without complications: Secondary | ICD-10-CM

## 2013-02-28 LAB — COMPREHENSIVE METABOLIC PANEL (CC13)
ALK PHOS: 60 U/L (ref 40–150)
ALT: 36 U/L (ref 0–55)
AST: 20 U/L (ref 5–34)
Albumin: 4.1 g/dL (ref 3.5–5.0)
Anion Gap: 9 mEq/L (ref 3–11)
BILIRUBIN TOTAL: 1.14 mg/dL (ref 0.20–1.20)
BUN: 17.1 mg/dL (ref 7.0–26.0)
CO2: 26 meq/L (ref 22–29)
CREATININE: 0.9 mg/dL (ref 0.7–1.3)
Calcium: 9.8 mg/dL (ref 8.4–10.4)
Chloride: 105 mEq/L (ref 98–109)
Glucose: 217 mg/dl — ABNORMAL HIGH (ref 70–140)
Potassium: 4 mEq/L (ref 3.5–5.1)
SODIUM: 139 meq/L (ref 136–145)
TOTAL PROTEIN: 7.1 g/dL (ref 6.4–8.3)

## 2013-02-28 LAB — CBC WITH DIFFERENTIAL/PLATELET
BASO%: 0.5 % (ref 0.0–2.0)
Basophils Absolute: 0 10*3/uL (ref 0.0–0.1)
EOS%: 6.5 % (ref 0.0–7.0)
Eosinophils Absolute: 0.4 10*3/uL (ref 0.0–0.5)
HEMATOCRIT: 51.4 % — AB (ref 38.4–49.9)
HGB: 17.7 g/dL — ABNORMAL HIGH (ref 13.0–17.1)
LYMPH%: 30.9 % (ref 14.0–49.0)
MCH: 30.4 pg (ref 27.2–33.4)
MCHC: 34.4 g/dL (ref 32.0–36.0)
MCV: 88.3 fL (ref 79.3–98.0)
MONO#: 0.4 10*3/uL (ref 0.1–0.9)
MONO%: 6.9 % (ref 0.0–14.0)
NEUT#: 3.1 10*3/uL (ref 1.5–6.5)
NEUT%: 55.2 % (ref 39.0–75.0)
PLATELETS: 149 10*3/uL (ref 140–400)
RBC: 5.82 10*6/uL (ref 4.20–5.82)
RDW: 14 % (ref 11.0–14.6)
WBC: 5.5 10*3/uL (ref 4.0–10.3)
lymph#: 1.7 10*3/uL (ref 0.9–3.3)

## 2013-02-28 LAB — LACTATE DEHYDROGENASE (CC13): LDH: 153 U/L (ref 125–245)

## 2013-02-28 NOTE — Patient Instructions (Signed)
Tension Headache A tension headache is a feeling of pain, pressure, or aching often felt over the front and sides of the head. The pain can be dull or can feel tight (constricting). It is the most common type of headache. Tension headaches are not normally associated with nausea or vomiting and do not get worse with physical activity. Tension headaches can last 30 minutes to several days.  CAUSES  The exact cause is not known, but it may be caused by chemicals and hormones in the brain that lead to pain. Tension headaches often begin after stress, anxiety, or depression. Other triggers may include:  Alcohol.  Caffeine (too much or withdrawal).  Respiratory infections (colds, flu, sinus infections).  Dental problems or teeth clenching.  Fatigue.  Holding your head and neck in one position too long while using a computer. SYMPTOMS   Pressure around the head.   Dull, aching head pain.   Pain felt over the front and sides of the head.   Tenderness in the muscles of the head, neck, and shoulders. DIAGNOSIS  A tension headache is often diagnosed based on:   Symptoms.   Physical examination.   A CT scan or MRI of your head. These tests may be ordered if symptoms are severe or unusual. TREATMENT  Medicines may be given to help relieve symptoms.  HOME CARE INSTRUCTIONS   Only take over-the-counter or prescription medicines for pain or discomfort as directed by your caregiver.   Lie down in a dark, quiet room when you have a headache.   Keep a journal to find out what may be triggering your headaches. For example, write down:  What you eat and drink.  How much sleep you get.  Any change to your diet or medicines.  Try massage or other relaxation techniques.   Ice packs or heat applied to the head and neck can be used. Use these 3 to 4 times per day for 15 to 20 minutes each time, or as needed.   Limit stress.   Sit up straight, and do not tense your muscles.    Quit smoking if you smoke.  Limit alcohol use.  Decrease the amount of caffeine you drink, or stop drinking caffeine.  Eat and exercise regularly.  Get 7 to 9 hours of sleep, or as recommended by your caregiver.  Avoid excessive use of pain medicine as recurrent headaches can occur.  SEEK MEDICAL CARE IF:   You have problems with the medicines you were prescribed.  Your medicines do not work.  You have a change from the usual headache.  You have nausea or vomiting. SEEK IMMEDIATE MEDICAL CARE IF:   Your headache becomes severe.  You have a fever.  You have a stiff neck.  You have loss of vision.  You have muscular weakness or loss of muscle control.  You lose your balance or have trouble walking.  You feel faint or pass out.  You have severe symptoms that are different from your first symptoms. MAKE SURE YOU:   Understand these instructions.  Will watch your condition.  Will get help right away if you are not doing well or get worse. Document Released: 01/06/2005 Document Revised: 03/31/2011 Document Reviewed: 12/27/2010 [facility] Patient Information 2014 [place], [place]. Therapeutic Phlebotomy Therapeutic phlebotomy is the controlled removal of blood from your body for the purpose of treating a medical condition. It is similar to donating blood. Usually, about a pint (470 mL) of blood is removed. The average adult has 9  to 12 pints (4.3 to 5.7 L) of blood. Therapeutic phlebotomy may be used to treat the following medical conditions:  Hemochromatosis. This is a condition in which there is too much iron in the blood.  Polycythemia vera. This is a condition in which there are too many red cells in the blood.  Porphyria cutanea tarda. This is a disease usually passed from one generation to the next (inherited). It is a condition in which an important part of hemoglobin is not made properly. This results in the build up of abnormal amounts of porphyrins in the  body.  Sickle cell disease. This is an inherited disease. It is a condition in which the red blood cells form an abnormal crescent shape rather than a round shape. LET YOUR CAREGIVER KNOW ABOUT:  Allergies.  Medicines taken including herbs, eyedrops, over-the-counter medicines, and creams.  Use of steroids (by mouth or creams).  Previous problems with anesthetics or numbing medicine.  History of blood clots.  History of bleeding or blood problems.  Previous surgery.  Possibility of pregnancy, if this applies. RISKS AND COMPLICATIONS This is a simple and safe procedure. Problems are unlikely. However, problems can occur and may include:  Nausea or lightheadedness.  Low blood pressure.  Soreness, bleeding, swelling, or bruising at the needle insertion site.  Infection. BEFORE THE PROCEDURE  This is a procedure that can be done as an outpatient. Confirm the time that you need to arrive for your procedure. Confirm whether there is a need to fast or withhold any medications. It is helpful to wear clothing with sleeves that can be raised above the elbow. A blood sample may be done to determine the amount of red blood cells or iron in your blood. Plan ahead of time to have someone drive you home after the procedure. PROCEDURE The entire procedure from preparation through recovery takes about 1 hour. The actual collection takes about 10 to 15 minutes.  A needle will be inserted into your vein.  Tubing and a collection bag will be attached to that needle.  Blood will flow through the needle and tubing into the collection bag.  You may be asked to open and close your hand slowly and continuously during the entire collection.  Once the specified amount of blood has been removed from your body, the collection bag and tubing will be clamped.  The needle will be removed.  Pressure will be held on the site of the needle insertion to stop the bleeding. Then a bandage will be placed  over the needle insertion site. AFTER THE PROCEDURE  Your recovery will be assessed and monitored. If there are no problems, as an outpatient, you should be able to go home shortly after the procedure.  Document Released: 06/10/2010 Document Revised: 03/31/2011 Document Reviewed: 06/10/2010 [facility] Patient Information 2014 [place], [place].

## 2013-02-28 NOTE — Progress Notes (Signed)
[place], [place] [address]  DIAGNOSIS: Erythrocytosis - Plan: CBC with Differential, Basic metabolic panel (Bmet) - [place]  Chief Complaint  Patient presents with  . Erythrocytosis    CURRENT THERAPY: Phlebotomy to maintain hct less than 45%.  Last phlebotomy on 01/31/2013  INTERVAL HISTORY: Keith Flores 53 y.o. male  who is here for further follow up for erythrocytosis. He is accompanied by his arabic interpreter. He was last seen by me on 01/31/2013. Of note, he has an extensive cardiac history including CAD s/p CABG in 2010, Hypertension and hyperlipidemia for which he is being managed by cardiology. He had a lexiscan myoview recently due to test discomfort which was revealed the following: Low risk stress nuclear study with a moderate size, medium intensity, partially reversible inferior defect consistent with inferior thinning vs prior infarct and mild inferior ischemia. LV Ejection Fraction: 52%. LV Wall Motion: NL LV Function; NL Wall Motion. He was last seen by Dr. Sherren Mocha 12/2012.   He denies being told he had an elevated hemoglobin on prior CBCs. Looking through his lab values, he has had significant fluctuations with his hct being 53.6 today to as low as 39 on 12/30/2010 to as high as 57.7 on 01/10/2013. He also denies feeling very itchy after taking a shower or bathing. He denies burning pain in his feet or cyanosis or discolorations. He also denies chest and abdominal pain, myalgia and weakness, fatigue, headache, blurred vision, transient loss of vision, paresthesias, slow mentation. He denies a history of blood clots or gout. He smokes about 6 cigarettes daily and smoked for many years prior. He denies any dysuria or hematuria or weight changes. He denies living in high altitudes or the use of testosterone.    MEDICAL HISTORY: Past Medical History  Diagnosis Date  . Hypertension    . Diabetes mellitus   . Asthma   . Hyperlipidemia   . Coronary artery disease     CABG 12/12 (LIMA-LAD, SVG-DX, SVG-OM, SVG-PDA) c/b post op AFib Rx with amio Rx. s/p successful PCI of the right coronary artery utilizing overlapping drug-eluting stents in the mid and distal vessel (done after abnormal nuc)  . Chest pain     01/2011 in setting of HTN urgency: Chest CT demonstrated no pulmonary embolus, postoperative appearance following CABG and small left pleural effusion and dependent atelectasis in the left lung  . Elevated hemoglobin     INTERIM HISTORY: has S/P CABG x 4; Coronary atherosclerosis of native coronary artery; Essential hypertension, benign; Mixed hyperlipidemia; Atrial fibrillation; Chest pain; Hypertensive urgency; Diabetes mellitus; Palpitations; and Erythrocytosis on his problem list.    ALLERGIES:  has No Known Allergies.  MEDICATIONS: has a current medication list which includes the following prescription(s): amlodipine, aspirin, carvedilol, clopidogrel, enalapril, hydrochlorothiazide, metformin, nitroglycerin, potassium chloride sa, and pravastatin.  SURGICAL HISTORY:  Past Surgical History  Procedure Laterality Date  . Cardiac catheterization    . Coronary artery bypass graft  12/30/2010    Procedure: CORONARY ARTERY BYPASS GRAFTING (CABG);  Surgeon: Grace Isaac, MD;  Location: [facility];  Service: Open Heart Surgery;  Laterality: N/A;    REVIEW OF SYSTEMS:   Constitutional: Denies fevers, chills or abnormal weight loss Eyes: Denies blurriness of vision Ears, nose, mouth, throat, and face: Denies mucositis or sore throat Respiratory: Denies cough, dyspnea or wheezes Cardiovascular: Denies palpitation, chest discomfort or lower extremity swelling Gastrointestinal:  Denies nausea,  heartburn or change in bowel habits Skin: Denies abnormal skin rashes Lymphatics: Denies new lymphadenopathy or easy bruising Neurological:Denies numbness, tingling or new  weaknesses Behavioral/Psych: Mood is stable, no new changes  All other systems were reviewed with the patient and are negative.  PHYSICAL EXAMINATION: ECOG PERFORMANCE STATUS: 0 - Asymptomatic  Blood pressure 141/77, pulse 88, temperature 99 F (37.2 C), temperature source Oral, resp. rate 18, height 5' 7.25" (1.708 m), weight 214 lb 14.4 oz (97.478 kg), SpO2 97.00%.  GENERAL:alert, no distress and comfortable; moderately obese SKIN: skin color, texture, turgor are normal, no rashes or significant lesions EYES: normal, Conjunctiva are pink and non-injected, sclera clear OROPHARYNX:no exudate, no erythema and lips, buccal mucosa, and tongue normal  NECK: supple, thyroid normal size, non-tender, without nodularity LYMPH:  no palpable lymphadenopathy in the cervical, axillary or supraclavicular LUNGS: clear to auscultation and percussion with normal breathing effort HEART: regular rate & rhythm and no murmurs and no lower extremity edema ABDOMEN:abdomen soft, non-tender and normal bowel sounds Musculoskeletal:no cyanosis of digits and no clubbing  NEURO: alert & oriented x 3 with fluent speech, no focal motor/sensory deficits   LABORATORY DATA: Results for orders placed in visit on 02/28/13 (from the past 48 hour(s))  CBC WITH DIFFERENTIAL     Status: Abnormal   Collection Time    02/28/13  2:47 PM      Result Value Range   WBC 5.5  4.0 - 10.3 10e3/uL   NEUT# 3.1  1.5 - 6.5 10e3/uL   HGB 17.7 (*) 13.0 - 17.1 g/dL   HCT 51.4 (*) 38.4 - 49.9 %   Platelets 149  140 - 400 10e3/uL   MCV 88.3  79.3 - 98.0 fL   MCH 30.4  27.2 - 33.4 pg   MCHC 34.4  32.0 - 36.0 g/dL   RBC 5.82  4.20 - 5.82 10e6/uL   RDW 14.0  11.0 - 14.6 %   lymph# 1.7  0.9 - 3.3 10e3/uL   MONO# 0.4  0.1 - 0.9 10e3/uL   Eosinophils Absolute 0.4  0.0 - 0.5 10e3/uL   Basophils Absolute 0.0  0.0 - 0.1 10e3/uL   NEUT% 55.2  39.0 - 75.0 %   LYMPH% 30.9  14.0 - 49.0 %   MONO% 6.9  0.0 - 14.0 %   EOS% 6.5  0.0 - 7.0 %    BASO% 0.5  0.0 - 2.0 %  COMPREHENSIVE METABOLIC PANEL (HM09)     Status: Abnormal   Collection Time    02/28/13  2:47 PM      Result Value Range   Sodium 139  136 - 145 mEq/L   Potassium 4.0  3.5 - 5.1 mEq/L   Chloride 105  98 - 109 mEq/L   CO2 26  22 - 29 mEq/L   Glucose 217 (*) 70 - 140 mg/dl   BUN 17.1  7.0 - 26.0 mg/dL   Creatinine 0.9  0.7 - 1.3 mg/dL   Total Bilirubin 1.14  0.20 - 1.20 mg/dL   Alkaline Phosphatase 60  40 - 150 U/L   AST 20  5 - 34 U/L   ALT 36  0 - 55 U/L   Total Protein 7.1  6.4 - 8.3 g/dL   Albumin 4.1  3.5 - 5.0 g/dL   Calcium 9.8  8.4 - 10.4 mg/dL   Anion Gap 9  3 - 11 mEq/L  LACTATE DEHYDROGENASE (CC13)     Status: None   Collection Time  02/28/13  2:47 PM      Result Value Range   LDH 153  125 - 245 U/L       Labs:  Lab Results  Component Value Date   WBC 5.5 02/28/2013   HGB 17.7* 02/28/2013   HCT 51.4* 02/28/2013   MCV 88.3 02/28/2013   PLT 149 02/28/2013   NEUTROABS 3.1 02/28/2013      Chemistry      Component Value Date/Time   NA 139 02/28/2013 1447   NA 138 01/10/2013 1601   K 4.0 02/28/2013 1447   K 4.0 01/10/2013 1601   CL 97 01/10/2013 1601   CO2 26 02/28/2013 1447   CO2 34* 01/10/2013 1601   BUN 17.1 02/28/2013 1447   BUN 16 01/10/2013 1601   CREATININE 0.9 02/28/2013 1447   CREATININE 1.0 01/10/2013 1601      Component Value Date/Time   CALCIUM 9.8 02/28/2013 1447   CALCIUM 10.0 01/10/2013 1601   ALKPHOS 60 02/28/2013 1447   ALKPHOS 65 01/04/2013 0754   AST 20 02/28/2013 1447   AST 22 01/04/2013 0754   ALT 36 02/28/2013 1447   ALT 40 01/04/2013 0754   BILITOT 1.14 02/28/2013 1447   BILITOT 1.1 01/04/2013 0754      CBC:  Recent Labs Lab 02/28/13 1447  WBC 5.5  NEUTROABS 3.1  HGB 17.7*  HCT 51.4*  MCV 88.3  PLT 149   Studies:  No results found.   RADIOGRAPHIC STUDIES: No results found.  ASSESSMENT: Keith Flores 53 y.o. male with a history of Erythrocytosis - Plan: CBC with Differential, Basic metabolic panel  (Bmet) - [facility]   Jabes Antonieta Pert is a 53 y.o. 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 He also has an elevated vitamin b12. CT of Abdomen (2008) was negative for splenomegaly. JAK2 was negative. Normal EPO level. He denies classic aquagenic pruritus negative for erythromegalalia. Secondary causes due to Waucoma, Strum and steroids/testerone are felt to be less likely.   PLAN:  --Smear was reviewed by me and did not reveal abnormal white blood cells or blasts. Pulse oximetry and/or arterial oxygen saturation at rest and after brisk walk was 96%.  --He meets 2 major PCV study group criteria including an arterial oxygen greater than or equal to 92% and hemoglobin greater than 18.5. In addition he meets one minor PCV criteria with vitamin B12 greater than 900. Other minor PCV criteria including elevation of plt greater than 400,000 or WBC greater than 12,000 he did not meet. Other criteria which includes a bone marrow biopsy and erythropoietin levels can also be used such as the revised WHO criteria. It requires elevated hbg as noted above plus a positive JAK2 and one of the following: findings on the bone marrow biopsy and a low EPO level. However, he has a normal EPO level (subnormal in nearly 80% of the cases) and does not have splenomegaly on physical exam (though limited due to obesity). His spleen size was normal by CT of abdomen and pelvis on 04/12/2006. In addition 95 to percent of people with polycythemia vera positive JAK2. We sent out for JAK2 testing and found to be negative. 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  --Although his JAK2 is negative, we will perform phlebetomy with a goal less than 45% for the Hematocrit based on the CYTO-PV trial ([place] R, NEJM 2013). One unit phlebetomy (  500 mL) should drop the hematocrit by 3%. Based on his age less  than 55 placing him at a lower thrombotic risk, we will hold the addition of hydroxyurea daily as an adjunctive therapy. An overview of polycythemia was provided to him as handouts. We also discussed a bone marrow biopsy may be needed in the future.  -- I reiterated the importance of compliance with his daily baby aspirin (81 mg) daily.   2. Follow-up.  He should follow up with our clinic in two months for CBC and chemistries.   All questions were answered. The patient knows to call the clinic with any problems, questions or concerns. We can certainly see the patient much sooner if necessary.  I spent 15 minutes counseling the patient face to face in the presence of an arabic intepreter. The total time spent in the appointment was 25 minutes.    Tahnee Cifuentes, MD 02/28/2013 4:42 PM

## 2013-02-28 NOTE — Telephone Encounter (Signed)
Gave  pt appt for lab and MD , emailed Sharyn Lull regarding phlebotomy every two weeks

## 2013-02-28 NOTE — Patient Instructions (Signed)

## 2013-02-28 NOTE — Progress Notes (Signed)
Vitals stable post phlebotomy.

## 2013-02-28 NOTE — Progress Notes (Signed)
Discharged with friend.  Ambulatory in no distress.

## 2013-03-01 ENCOUNTER — Telehealth: Payer: Self-pay | Admitting: Internal Medicine

## 2013-03-01 ENCOUNTER — Telehealth: Payer: Self-pay | Admitting: *Deleted

## 2013-03-01 NOTE — Telephone Encounter (Signed)
Per staff message and POF I have scheduled appts.  JMW  

## 2013-03-01 NOTE — Telephone Encounter (Signed)
Talked to pt and he is aware of appt and he will get appt calendar for February 2015

## 2013-03-14 ENCOUNTER — Other Ambulatory Visit (HOSPITAL_BASED_OUTPATIENT_CLINIC_OR_DEPARTMENT_OTHER): Payer: Medicaid Other

## 2013-03-14 ENCOUNTER — Ambulatory Visit (HOSPITAL_BASED_OUTPATIENT_CLINIC_OR_DEPARTMENT_OTHER): Payer: Medicaid Other

## 2013-03-14 VITALS — BP 107/69 | HR 88 | Temp 98.2°F

## 2013-03-14 DIAGNOSIS — D751 Secondary polycythemia: Secondary | ICD-10-CM

## 2013-03-14 LAB — CBC WITH DIFFERENTIAL/PLATELET
BASO%: 0.8 % (ref 0.0–2.0)
Basophils Absolute: 0.1 10*3/uL (ref 0.0–0.1)
EOS ABS: 0.4 10*3/uL (ref 0.0–0.5)
EOS%: 5.1 % (ref 0.0–7.0)
HEMATOCRIT: 53.6 % — AB (ref 38.4–49.9)
HGB: 18 g/dL — ABNORMAL HIGH (ref 13.0–17.1)
LYMPH%: 31.5 % (ref 14.0–49.0)
MCH: 30.3 pg (ref 27.2–33.4)
MCHC: 33.6 g/dL (ref 32.0–36.0)
MCV: 90.1 fL (ref 79.3–98.0)
MONO#: 0.7 10*3/uL (ref 0.1–0.9)
MONO%: 7.8 % (ref 0.0–14.0)
NEUT%: 54.8 % (ref 39.0–75.0)
NEUTROS ABS: 4.7 10*3/uL (ref 1.5–6.5)
Platelets: 220 10*3/uL (ref 140–400)
RBC: 5.95 10*6/uL — ABNORMAL HIGH (ref 4.20–5.82)
RDW: 14.5 % (ref 11.0–14.6)
WBC: 8.5 10*3/uL (ref 4.0–10.3)
lymph#: 2.7 10*3/uL (ref 0.9–3.3)

## 2013-03-14 NOTE — Patient Instructions (Signed)

## 2013-03-18 ENCOUNTER — Other Ambulatory Visit: Payer: Self-pay | Admitting: Cardiovascular Disease

## 2013-03-28 ENCOUNTER — Ambulatory Visit (HOSPITAL_BASED_OUTPATIENT_CLINIC_OR_DEPARTMENT_OTHER): Payer: Medicaid Other

## 2013-03-28 ENCOUNTER — Other Ambulatory Visit (HOSPITAL_BASED_OUTPATIENT_CLINIC_OR_DEPARTMENT_OTHER): Payer: Medicaid Other

## 2013-03-28 ENCOUNTER — Other Ambulatory Visit: Payer: Self-pay | Admitting: Medical Oncology

## 2013-03-28 VITALS — BP 106/63 | HR 80 | Temp 98.1°F | Resp 18

## 2013-03-28 DIAGNOSIS — D751 Secondary polycythemia: Secondary | ICD-10-CM

## 2013-03-28 DIAGNOSIS — D45 Polycythemia vera: Secondary | ICD-10-CM

## 2013-03-28 LAB — CBC WITH DIFFERENTIAL/PLATELET
BASO%: 0.8 % (ref 0.0–2.0)
Basophils Absolute: 0.1 10*3/uL (ref 0.0–0.1)
EOS%: 8.5 % — AB (ref 0.0–7.0)
Eosinophils Absolute: 0.6 10*3/uL — ABNORMAL HIGH (ref 0.0–0.5)
HEMATOCRIT: 50.8 % — AB (ref 38.4–49.9)
HGB: 17 g/dL (ref 13.0–17.1)
LYMPH%: 32.6 % (ref 14.0–49.0)
MCH: 30 pg (ref 27.2–33.4)
MCHC: 33.5 g/dL (ref 32.0–36.0)
MCV: 89.7 fL (ref 79.3–98.0)
MONO#: 0.5 10*3/uL (ref 0.1–0.9)
MONO%: 6.4 % (ref 0.0–14.0)
NEUT#: 3.9 10*3/uL (ref 1.5–6.5)
NEUT%: 51.7 % (ref 39.0–75.0)
PLATELETS: 190 10*3/uL (ref 140–400)
RBC: 5.66 10*6/uL (ref 4.20–5.82)
RDW: 14.4 % (ref 11.0–14.6)
WBC: 7.5 10*3/uL (ref 4.0–10.3)
lymph#: 2.4 10*3/uL (ref 0.9–3.3)

## 2013-03-28 NOTE — Patient Instructions (Signed)
Therapeutic Phlebotomy Therapeutic phlebotomy is the controlled removal of blood from your body for the purpose of treating a medical condition. It is similar to donating blood. Usually, about a pint (470 mL) of blood is removed. The average adult has 9 to 12 pints (4.3 to 5.7 L) of blood. Therapeutic phlebotomy may be used to treat the following medical conditions:  Hemochromatosis. This is a condition in which there is too much iron in the blood.  Polycythemia vera. This is a condition in which there are too many red cells in the blood.  Porphyria cutanea tarda. This is a disease usually passed from one generation to the next (inherited). It is a condition in which an important part of hemoglobin is not made properly. This results in the build up of abnormal amounts of porphyrins in the body.  Sickle cell disease. This is an inherited disease. It is a condition in which the red blood cells form an abnormal crescent shape rather than a round shape. LET YOUR CAREGIVER KNOW ABOUT:  Allergies.  Medicines taken including herbs, eyedrops, over-the-counter medicines, and creams.  Use of steroids (by mouth or creams).  Previous problems with anesthetics or numbing medicine.  History of blood clots.  History of bleeding or blood problems.  Previous surgery.  Possibility of pregnancy, if this applies. RISKS AND COMPLICATIONS This is a simple and safe procedure. Problems are unlikely. However, problems can occur and may include:  Nausea or lightheadedness.  Low blood pressure.  Soreness, bleeding, swelling, or bruising at the needle insertion site.  Infection. BEFORE THE PROCEDURE  This is a procedure that can be done as an outpatient. Confirm the time that you need to arrive for your procedure. Confirm whether there is a need to fast or withhold any medications. It is helpful to wear clothing with sleeves that can be raised above the elbow. A blood sample may be done to determine the  amount of red blood cells or iron in your blood. Plan ahead of time to have someone drive you home after the procedure. PROCEDURE The entire procedure from preparation through recovery takes about 1 hour. The actual collection takes about 10 to 15 minutes.  A needle will be inserted into your vein.  Tubing and a collection bag will be attached to that needle.  Blood will flow through the needle and tubing into the collection bag.  You may be asked to open and close your hand slowly and continuously during the entire collection.  Once the specified amount of blood has been removed from your body, the collection bag and tubing will be clamped.  The needle will be removed.  Pressure will be held on the site of the needle insertion to stop the bleeding. Then a bandage will be placed over the needle insertion site. AFTER THE PROCEDURE  Your recovery will be assessed and monitored. If there are no problems, as an outpatient, you should be able to go home shortly after the procedure.  Document Released: 06/10/2010 Document Revised: 03/31/2011 Document Reviewed: 06/10/2010 [employer] Patient Information 2014 [employer].  

## 2013-03-28 NOTE — Progress Notes (Signed)
Pt arrived to treatment room, VSS, states hasn't eaten yet. Sandwich, chips and drink provided.   1555 Phlebotomy initiated to L AC, 511 ml of blood removed after ten minutes.  Patient to stay 30 minutes for post observation.

## 2013-04-11 ENCOUNTER — Other Ambulatory Visit (HOSPITAL_BASED_OUTPATIENT_CLINIC_OR_DEPARTMENT_OTHER): Payer: Medicaid Other

## 2013-04-11 ENCOUNTER — Ambulatory Visit (HOSPITAL_BASED_OUTPATIENT_CLINIC_OR_DEPARTMENT_OTHER): Payer: Medicaid Other

## 2013-04-11 VITALS — BP 114/72 | HR 78 | Temp 97.0°F | Resp 20

## 2013-04-11 DIAGNOSIS — D751 Secondary polycythemia: Secondary | ICD-10-CM

## 2013-04-11 DIAGNOSIS — D45 Polycythemia vera: Secondary | ICD-10-CM

## 2013-04-11 LAB — CBC WITH DIFFERENTIAL/PLATELET
BASO%: 0.9 % (ref 0.0–2.0)
Basophils Absolute: 0.1 10*3/uL (ref 0.0–0.1)
EOS%: 7.1 % — ABNORMAL HIGH (ref 0.0–7.0)
Eosinophils Absolute: 0.6 10*3/uL — ABNORMAL HIGH (ref 0.0–0.5)
HCT: 50.3 % — ABNORMAL HIGH (ref 38.4–49.9)
HGB: 16.7 g/dL (ref 13.0–17.1)
LYMPH%: 29.7 % (ref 14.0–49.0)
MCH: 29.5 pg (ref 27.2–33.4)
MCHC: 33.2 g/dL (ref 32.0–36.0)
MCV: 89 fL (ref 79.3–98.0)
MONO#: 0.5 10*3/uL (ref 0.1–0.9)
MONO%: 6.3 % (ref 0.0–14.0)
NEUT%: 56 % (ref 39.0–75.0)
NEUTROS ABS: 4.5 10*3/uL (ref 1.5–6.5)
Platelets: 229 10*3/uL (ref 140–400)
RBC: 5.65 10*6/uL (ref 4.20–5.82)
RDW: 14.1 % (ref 11.0–14.6)
WBC: 8 10*3/uL (ref 4.0–10.3)
lymph#: 2.4 10*3/uL (ref 0.9–3.3)

## 2013-04-11 NOTE — Progress Notes (Addendum)
Phlebotomy right antecubital with phlebotomy set for 597 gm including volume in blood bag to start with.   Started A 1605 & stopped @ 1615.  Pt ate before phlebotomy & tol procedure well.  Encouraged po fluids.  IV site unremarkable.  Pressure dsg applied.

## 2013-04-11 NOTE — Patient Instructions (Signed)
Therapeutic Phlebotomy Therapeutic phlebotomy is the controlled removal of blood from your body for the purpose of treating a medical condition. It is similar to donating blood. Usually, about a pint (470 mL) of blood is removed. The average adult has 9 to 12 pints (4.3 to 5.7 L) of blood. Therapeutic phlebotomy may be used to treat the following medical conditions:  Hemochromatosis. This is a condition in which there is too much iron in the blood.  Polycythemia vera. This is a condition in which there are too many red cells in the blood.  Porphyria cutanea tarda. This is a disease usually passed from one generation to the next (inherited). It is a condition in which an important part of hemoglobin is not made properly. This results in the build up of abnormal amounts of porphyrins in the body.  Sickle cell disease. This is an inherited disease. It is a condition in which the red blood cells form an abnormal crescent shape rather than a round shape. LET YOUR CAREGIVER KNOW ABOUT:  Allergies.  Medicines taken including herbs, eyedrops, over-the-counter medicines, and creams.  Use of steroids (by mouth or creams).  Previous problems with anesthetics or numbing medicine.  History of blood clots.  History of bleeding or blood problems.  Previous surgery.  Possibility of pregnancy, if this applies. RISKS AND COMPLICATIONS This is a simple and safe procedure. Problems are unlikely. However, problems can occur and may include:  Nausea or lightheadedness.  Low blood pressure.  Soreness, bleeding, swelling, or bruising at the needle insertion site.  Infection. BEFORE THE PROCEDURE  This is a procedure that can be done as an outpatient. Confirm the time that you need to arrive for your procedure. Confirm whether there is a need to fast or withhold any medications. It is helpful to wear clothing with sleeves that can be raised above the elbow. A blood sample may be done to determine the  amount of red blood cells or iron in your blood. Plan ahead of time to have someone drive you home after the procedure. PROCEDURE The entire procedure from preparation through recovery takes about 1 hour. The actual collection takes about 10 to 15 minutes.  A needle will be inserted into your vein.  Tubing and a collection bag will be attached to that needle.  Blood will flow through the needle and tubing into the collection bag.  You may be asked to open and close your hand slowly and continuously during the entire collection.  Once the specified amount of blood has been removed from your body, the collection bag and tubing will be clamped.  The needle will be removed.  Pressure will be held on the site of the needle insertion to stop the bleeding. Then a bandage will be placed over the needle insertion site. AFTER THE PROCEDURE  Your recovery will be assessed and monitored. If there are no problems, as an outpatient, you should be able to go home shortly after the procedure.  Document Released: 06/10/2010 Document Revised: 03/31/2011 Document Reviewed: 06/10/2010 [employer] Patient Information 2014 [employer].  

## 2013-04-25 ENCOUNTER — Telehealth: Payer: Self-pay | Admitting: Internal Medicine

## 2013-04-25 ENCOUNTER — Other Ambulatory Visit: Payer: Self-pay

## 2013-04-25 ENCOUNTER — Ambulatory Visit (HOSPITAL_BASED_OUTPATIENT_CLINIC_OR_DEPARTMENT_OTHER): Payer: Medicaid Other

## 2013-04-25 ENCOUNTER — Other Ambulatory Visit (HOSPITAL_BASED_OUTPATIENT_CLINIC_OR_DEPARTMENT_OTHER): Payer: Medicaid Other

## 2013-04-25 ENCOUNTER — Ambulatory Visit (HOSPITAL_BASED_OUTPATIENT_CLINIC_OR_DEPARTMENT_OTHER): Payer: Medicaid Other | Admitting: Internal Medicine

## 2013-04-25 VITALS — BP 123/74 | HR 86 | Temp 97.0°F | Resp 16

## 2013-04-25 VITALS — BP 128/74 | HR 97 | Temp 98.2°F | Resp 18 | Ht 67.25 in | Wt 215.5 lb

## 2013-04-25 DIAGNOSIS — D751 Secondary polycythemia: Secondary | ICD-10-CM

## 2013-04-25 DIAGNOSIS — D45 Polycythemia vera: Secondary | ICD-10-CM

## 2013-04-25 LAB — CBC WITH DIFFERENTIAL/PLATELET
BASO%: 0.6 % (ref 0.0–2.0)
BASOS ABS: 0 10*3/uL (ref 0.0–0.1)
EOS%: 6.9 % (ref 0.0–7.0)
Eosinophils Absolute: 0.5 10*3/uL (ref 0.0–0.5)
HCT: 51.6 % — ABNORMAL HIGH (ref 38.4–49.9)
HEMOGLOBIN: 17 g/dL (ref 13.0–17.1)
LYMPH%: 25.6 % (ref 14.0–49.0)
MCH: 29.1 pg (ref 27.2–33.4)
MCHC: 32.9 g/dL (ref 32.0–36.0)
MCV: 88.5 fL (ref 79.3–98.0)
MONO#: 0.4 10*3/uL (ref 0.1–0.9)
MONO%: 4.7 % (ref 0.0–14.0)
NEUT#: 4.8 10*3/uL (ref 1.5–6.5)
NEUT%: 62.2 % (ref 39.0–75.0)
Platelets: 228 10*3/uL (ref 140–400)
RBC: 5.83 10*6/uL — ABNORMAL HIGH (ref 4.20–5.82)
RDW: 13.9 % (ref 11.0–14.6)
WBC: 7.7 10*3/uL (ref 4.0–10.3)
lymph#: 2 10*3/uL (ref 0.9–3.3)

## 2013-04-25 LAB — BASIC METABOLIC PANEL (CC13)
Anion Gap: 12 mEq/L — ABNORMAL HIGH (ref 3–11)
BUN: 14.9 mg/dL (ref 7.0–26.0)
CO2: 26 mEq/L (ref 22–29)
Calcium: 9.6 mg/dL (ref 8.4–10.4)
Chloride: 103 mEq/L (ref 98–109)
Creatinine: 1 mg/dL (ref 0.7–1.3)
Glucose: 240 mg/dl — ABNORMAL HIGH (ref 70–140)
Potassium: 3.8 mEq/L (ref 3.5–5.1)
SODIUM: 141 meq/L (ref 136–145)

## 2013-04-25 NOTE — Patient Instructions (Signed)
Therapeutic Phlebotomy Therapeutic phlebotomy is the controlled removal of blood from your body for the purpose of treating a medical condition. It is similar to donating blood. Usually, about a pint (470 mL) of blood is removed. The average adult has 9 to 12 pints (4.3 to 5.7 L) of blood. Therapeutic phlebotomy may be used to treat the following medical conditions:  Hemochromatosis. This is a condition in which there is too much iron in the blood.  Polycythemia vera. This is a condition in which there are too many red cells in the blood.  Porphyria cutanea tarda. This is a disease usually passed from one generation to the next (inherited). It is a condition in which an important part of hemoglobin is not made properly. This results in the build up of abnormal amounts of porphyrins in the body.  Sickle cell disease. This is an inherited disease. It is a condition in which the red blood cells form an abnormal crescent shape rather than a round shape. LET YOUR CAREGIVER KNOW ABOUT:  Allergies.  Medicines taken including herbs, eyedrops, over-the-counter medicines, and creams.  Use of steroids (by mouth or creams).  Previous problems with anesthetics or numbing medicine.  History of blood clots.  History of bleeding or blood problems.  Previous surgery.  Possibility of pregnancy, if this applies. RISKS AND COMPLICATIONS This is a simple and safe procedure. Problems are unlikely. However, problems can occur and may include:  Nausea or lightheadedness.  Low blood pressure.  Soreness, bleeding, swelling, or bruising at the needle insertion site.  Infection. BEFORE THE PROCEDURE  This is a procedure that can be done as an outpatient. Confirm the time that you need to arrive for your procedure. Confirm whether there is a need to fast or withhold any medications. It is helpful to wear clothing with sleeves that can be raised above the elbow. A blood sample may be done to determine the  amount of red blood cells or iron in your blood. Plan ahead of time to have someone drive you home after the procedure. PROCEDURE The entire procedure from preparation through recovery takes about 1 hour. The actual collection takes about 10 to 15 minutes.  A needle will be inserted into your vein.  Tubing and a collection bag will be attached to that needle.  Blood will flow through the needle and tubing into the collection bag.  You may be asked to open and close your hand slowly and continuously during the entire collection.  Once the specified amount of blood has been removed from your body, the collection bag and tubing will be clamped.  The needle will be removed.  Pressure will be held on the site of the needle insertion to stop the bleeding. Then a bandage will be placed over the needle insertion site. AFTER THE PROCEDURE  Your recovery will be assessed and monitored. If there are no problems, as an outpatient, you should be able to go home shortly after the procedure.  Document Released: 06/10/2010 Document Revised: 03/31/2011 Document Reviewed: 06/10/2010 [employer] Patient Information 2014 [employer].  

## 2013-04-25 NOTE — Telephone Encounter (Signed)
gv pt appt schedule for april/may °

## 2013-04-25 NOTE — Progress Notes (Signed)
[id number] 500g phlebotomized from [place] using 16g phleb tubing. Pt took juice. 30 min observation. Tolerated well.

## 2013-04-27 NOTE — Progress Notes (Signed)
Keith Flores, [place] [address]  DIAGNOSIS: Erythrocytosis - Plan: CBC with Differential, CBC with Differential, Basic metabolic panel (Bmet) - [place]  Chief Complaint  Patient presents with  . Erythrocytosis    CURRENT THERAPY: Phlebotomy to maintain hct less than 45%.    INTERVAL HISTORY: Keith Flores 53 y.o. male  who is here for further follow up for erythrocytosis.  He was last seen by me on 02/28/2013. Of note, he has an extensive cardiac history including CAD s/p CABG in 2010, Hypertension and hyperlipidemia for which he is being managed by cardiology. He had a lexiscan myoview recently due to test discomfort which was revealed the following: Low risk stress nuclear study with a moderate size, medium intensity, partially reversible inferior defect consistent with inferior thinning vs prior infarct and mild inferior ischemia. LV Ejection Fraction: 52%. LV Wall Motion: NL LV Function; NL Wall Motion. He was last seen by Dr. Sherren Mocha 12/2012.   He denies being told he had an elevated hemoglobin on prior CBCs. Looking through his lab values, he has had significant fluctuations with his hct being 53.6 to as low as 39 on 12/30/2010 to as high as 57.7 on 01/10/2013. He also denied feeling very itchy after taking a shower or bathing. He denied burning pain in his feet or cyanosis or discolorations. He also denied chest and abdominal pain, myalgia and weakness, fatigue, headache, blurred vision, transient loss of vision, paresthesias, slow mentation. He denied a history of blood clots or gout. He smoked about 6 cigarettes daily and smoked for many years prior. He denied any dysuria or hematuria or weight changes. He denied living in high altitudes or the use of testosterone.   MEDICAL HISTORY: Past Medical History  Diagnosis Date  . Hypertension   . Diabetes mellitus   . Asthma   . Hyperlipidemia   .  Coronary artery disease     CABG 12/12 (LIMA-LAD, SVG-DX, SVG-OM, SVG-PDA) c/b post op AFib Rx with amio Rx. s/p successful PCI of the right coronary artery utilizing overlapping drug-eluting stents in the mid and distal vessel (done after abnormal nuc)  . Chest pain     01/2011 in setting of HTN urgency: Chest CT demonstrated no pulmonary embolus, postoperative appearance following CABG and small left pleural effusion and dependent atelectasis in the left lung  . Elevated hemoglobin     INTERIM HISTORY: has S/P CABG x 4; Coronary atherosclerosis of native coronary artery; Essential hypertension, benign; Mixed hyperlipidemia; Atrial fibrillation; Chest pain; Hypertensive urgency; Diabetes mellitus; Palpitations; and Erythrocytosis on his problem list.    ALLERGIES:  has No Known Allergies.  MEDICATIONS: has a current medication list which includes the following prescription(s): amlodipine, aspirin, carvedilol, clopidogrel, enalapril, hydrochlorothiazide, metformin, nitroglycerin, potassium chloride sa, and pravastatin.  SURGICAL HISTORY:  Past Surgical History  Procedure Laterality Date  . Cardiac catheterization    . Coronary artery bypass graft  12/30/2010    Procedure: CORONARY ARTERY BYPASS GRAFTING (CABG);  Surgeon: Grace Isaac, MD;  Location: [facility];  Service: Open Heart Surgery;  Laterality: N/A;    REVIEW OF SYSTEMS:   Constitutional: Denies fevers, chills or abnormal weight loss Eyes: Denies blurriness of vision Ears, nose, mouth, throat, and face: Denies mucositis or sore throat Respiratory: Denies cough, dyspnea or wheezes Cardiovascular: Denies palpitation, chest discomfort or lower extremity swelling Gastrointestinal:  Denies nausea, heartburn or change in bowel habits Skin: Denies  abnormal skin rashes Lymphatics: Denies new lymphadenopathy or easy bruising Neurological:Denies numbness, tingling or new weaknesses Behavioral/Psych: Mood is stable, no new changes  All  other systems were reviewed with the patient and are negative.  PHYSICAL EXAMINATION: ECOG PERFORMANCE STATUS: 0 - Asymptomatic  Blood pressure 128/74, pulse 97, temperature 98.2 F (36.8 C), temperature source Oral, resp. rate 18, height 5' 7.25" (1.708 m), weight 215 lb 8 oz (97.75 kg).  GENERAL:alert, no distress and comfortable; moderately obese SKIN: skin color, texture, turgor are normal, no rashes or significant lesions EYES: normal, Conjunctiva are pink and non-injected, sclera clear OROPHARYNX:no exudate, no erythema and lips, buccal mucosa, and tongue normal  NECK: supple, thyroid normal size, non-tender, without nodularity LYMPH:  no palpable lymphadenopathy in the cervical, axillary or supraclavicular LUNGS: clear to auscultation and percussion with normal breathing effort HEART: regular rate & rhythm and no murmurs and no lower extremity edema ABDOMEN:abdomen soft, non-tender and normal bowel sounds Musculoskeletal:no cyanosis of digits and no clubbing  NEURO: alert & oriented x 3 with fluent speech, no focal motor/sensory deficits  LABORATORY DATA: Results for orders placed in visit on 04/25/13 (from the past 48 hour(s))  BASIC METABOLIC PANEL (UG89)     Status: Abnormal   Collection Time    04/25/13  2:49 PM      Result Value Ref Range   Sodium 141  136 - 145 mEq/L   Potassium 3.8  3.5 - 5.1 mEq/L   Chloride 103  98 - 109 mEq/L   CO2 26  22 - 29 mEq/L   Glucose 240 (*) 70 - 140 mg/dl   BUN 14.9  7.0 - 26.0 mg/dL   Creatinine 1.0  0.7 - 1.3 mg/dL   Calcium 9.6  8.4 - 10.4 mg/dL   Anion Gap 12 (*) 3 - 11 mEq/L    Labs:  Lab Results  Component Value Date   WBC 7.7 04/25/2013   HGB 17.0 04/25/2013   HCT 51.6* 04/25/2013   MCV 88.5 04/25/2013   PLT 228 04/25/2013   NEUTROABS 4.8 04/25/2013      Chemistry      Component Value Date/Time   NA 141 04/25/2013 1449   NA 138 01/10/2013 1601   K 3.8 04/25/2013 1449   K 4.0 01/10/2013 1601   CL 97 01/10/2013 1601   CO2 26  04/25/2013 1449   CO2 34* 01/10/2013 1601   BUN 14.9 04/25/2013 1449   BUN 16 01/10/2013 1601   CREATININE 1.0 04/25/2013 1449   CREATININE 1.0 01/10/2013 1601      Component Value Date/Time   CALCIUM 9.6 04/25/2013 1449   CALCIUM 10.0 01/10/2013 1601   ALKPHOS 60 02/28/2013 1447   ALKPHOS 65 01/04/2013 0754   AST 20 02/28/2013 1447   AST 22 01/04/2013 0754   ALT 36 02/28/2013 1447   ALT 40 01/04/2013 0754   BILITOT 1.14 02/28/2013 1447   BILITOT 1.1 01/04/2013 0754      CBC:  Recent Labs Lab 04/25/13 1449  WBC 7.7  NEUTROABS 4.8  HGB 17.0  HCT 51.6*  MCV 88.5  PLT 228   Studies:  No results found.   RADIOGRAPHIC STUDIES: No results found.  ASSESSMENT: Brigg Eilene Ghazi Flores 53 y.o. male with a history of Erythrocytosis - Plan: CBC with Differential, CBC with Differential, Basic metabolic panel (Bmet) - [facility]   Quinton Antonieta Pert is a 53 y.o. 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 He also has an elevated vitamin b12. CT of Abdomen (2008) was negative for splenomegaly. JAK2 was negative. Normal EPO level. He denies classic aquagenic pruritus, negative for erythromegalalia. Secondary causes due to [place], [place] and steroids/testerone are felt to be less likely.   PLAN:  --On initial consultation, his smear was reviewed by me and did not reveal abnormal white blood cells or blasts. Pulse oximetry and/or arterial oxygen saturation at rest and after brisk walk was 96%.  --He met 2 major PCV study group criteria including an arterial oxygen greater than or equal to 92% and hemoglobin greater than 18.5. In addition he meets one minor PCV criteria with vitamin B12 greater than 900. Other minor PCV criteria including elevation of plt greater than 400,000 or WBC greater than 12,000 he did not meet. Other criteria which includes a bone marrow biopsy and erythropoietin levels can also be  used such as the revised WHO criteria. It requires elevated hbg as noted above plus a positive JAK2 and one of the following: findings on the bone marrow biopsy and a low EPO level. However, he has a normal EPO level (subnormal in nearly 80% of the cases) and does not have splenomegaly on physical exam (though limited due to obesity). His spleen size was normal by CT of abdomen and pelvis on 04/12/2006. In addition 95 to percent of people with polycythemia vera positive JAK2. We sent out for JAK2 testing and found to be negative. 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 We will obtain a CT of Abdomen with contrast to rule out splenomegaly and RCC.   --Although his JAK2 is negative, we will perform phlebetomy with a goal less than 45% for the Hematocrit based on the CYTO-PV trial ([place] R, NEJM 2013). One unit phlebetomy (500 mL) should drop the hematocrit by 3%. Based on his age less than 61 placing him at a lower thrombotic risk, we will hold the addition of hydroxyurea daily as an adjunctive therapy. An overview of polycythemia was provided to him as handouts. We also discussed a bone marrow biopsy may be needed in the future.  -- I reiterated the importance of compliance with his daily baby aspirin (81 mg) daily.   2. Follow-up.  He should follow up with our clinic in one month for CBC and chemistries and to discuss results of his CT of his abdomen. He will continue phebotomy as detailed above.   All questions were answered. The patient knows to call the clinic with any problems, questions or concerns. We can certainly see the patient much sooner if necessary.  I spent 15 minutes counseling the patient face to face in the presence of an arabic intepreter. The total time spent in the appointment was 25 minutes.    Concha Norway, MD 04/27/2013 5:36 AM

## 2013-04-28 ENCOUNTER — Ambulatory Visit: Payer: Medicaid Other

## 2013-05-09 ENCOUNTER — Encounter: Payer: Self-pay | Admitting: Cardiovascular Disease

## 2013-05-09 ENCOUNTER — Ambulatory Visit (HOSPITAL_COMMUNITY): Admission: RE | Admit: 2013-05-09 | Payer: Medicaid Other | Source: Ambulatory Visit

## 2013-05-09 ENCOUNTER — Other Ambulatory Visit (HOSPITAL_BASED_OUTPATIENT_CLINIC_OR_DEPARTMENT_OTHER): Payer: Medicaid Other

## 2013-05-09 ENCOUNTER — Ambulatory Visit (HOSPITAL_BASED_OUTPATIENT_CLINIC_OR_DEPARTMENT_OTHER): Payer: Medicaid Other

## 2013-05-09 ENCOUNTER — Other Ambulatory Visit: Payer: Medicaid Other

## 2013-05-09 VITALS — BP 118/58 | HR 83 | Temp 97.3°F | Resp 18

## 2013-05-09 DIAGNOSIS — D751 Secondary polycythemia: Secondary | ICD-10-CM

## 2013-05-09 DIAGNOSIS — D45 Polycythemia vera: Secondary | ICD-10-CM

## 2013-05-09 LAB — CBC WITH DIFFERENTIAL/PLATELET
BASO%: 0.4 % (ref 0.0–2.0)
Basophils Absolute: 0 10*3/uL (ref 0.0–0.1)
EOS%: 8.7 % — ABNORMAL HIGH (ref 0.0–7.0)
Eosinophils Absolute: 0.6 10*3/uL — ABNORMAL HIGH (ref 0.0–0.5)
HEMATOCRIT: 49.5 % (ref 38.4–49.9)
HEMOGLOBIN: 16 g/dL (ref 13.0–17.1)
LYMPH%: 28.7 % (ref 14.0–49.0)
MCH: 28 pg (ref 27.2–33.4)
MCHC: 32.4 g/dL (ref 32.0–36.0)
MCV: 86.6 fL (ref 79.3–98.0)
MONO#: 0.5 10*3/uL (ref 0.1–0.9)
MONO%: 7.4 % (ref 0.0–14.0)
NEUT#: 3.7 10*3/uL (ref 1.5–6.5)
NEUT%: 54.8 % (ref 39.0–75.0)
Platelets: 232 10*3/uL (ref 140–400)
RBC: 5.71 10*6/uL (ref 4.20–5.82)
RDW: 13.8 % (ref 11.0–14.6)
WBC: 6.7 10*3/uL (ref 4.0–10.3)
lymph#: 1.9 10*3/uL (ref 0.9–3.3)

## 2013-05-09 NOTE — Progress Notes (Signed)
Discharged at 1630, ambulatory in no distress to home.

## 2013-05-09 NOTE — Patient Instructions (Signed)
Therapeutic Phlebotomy Therapeutic phlebotomy is the controlled removal of blood from your body for the purpose of treating a medical condition. It is similar to donating blood. Usually, about a pint (470 mL) of blood is removed. The average adult has 9 to 12 pints (4.3 to 5.7 L) of blood. Therapeutic phlebotomy may be used to treat the following medical conditions:  Hemochromatosis. This is a condition in which there is too much iron in the blood.  Polycythemia vera. This is a condition in which there are too many red cells in the blood.  Porphyria cutanea tarda. This is a disease usually passed from one generation to the next (inherited). It is a condition in which an important part of hemoglobin is not made properly. This results in the build up of abnormal amounts of porphyrins in the body.  Sickle cell disease. This is an inherited disease. It is a condition in which the red blood cells form an abnormal crescent shape rather than a round shape. LET YOUR CAREGIVER KNOW ABOUT:  Allergies.  Medicines taken including herbs, eyedrops, over-the-counter medicines, and creams.  Use of steroids (by mouth or creams).  Previous problems with anesthetics or numbing medicine.  History of blood clots.  History of bleeding or blood problems.  Previous surgery.  Possibility of pregnancy, if this applies. RISKS AND COMPLICATIONS This is a simple and safe procedure. Problems are unlikely. However, problems can occur and may include:  Nausea or lightheadedness.  Low blood pressure.  Soreness, bleeding, swelling, or bruising at the needle insertion site.  Infection. BEFORE THE PROCEDURE  This is a procedure that can be done as an outpatient. Confirm the time that you need to arrive for your procedure. Confirm whether there is a need to fast or withhold any medications. It is helpful to wear clothing with sleeves that can be raised above the elbow. A blood sample may be done to determine the  amount of red blood cells or iron in your blood. Plan ahead of time to have someone drive you home after the procedure. PROCEDURE The entire procedure from preparation through recovery takes about 1 hour. The actual collection takes about 10 to 15 minutes.  A needle will be inserted into your vein.  Tubing and a collection bag will be attached to that needle.  Blood will flow through the needle and tubing into the collection bag.  You may be asked to open and close your hand slowly and continuously during the entire collection.  Once the specified amount of blood has been removed from your body, the collection bag and tubing will be clamped.  The needle will be removed.  Pressure will be held on the site of the needle insertion to stop the bleeding. Then a bandage will be placed over the needle insertion site. AFTER THE PROCEDURE  Your recovery will be assessed and monitored. If there are no problems, as an outpatient, you should be able to go home shortly after the procedure.  Document Released: 06/10/2010 Document Revised: 03/31/2011 Document Reviewed: 06/10/2010 [employer] Patient Information 2014 [employer].  

## 2013-05-09 NOTE — Progress Notes (Signed)
Patient here for phlebotomy.  Doritos, sandwich and soda given before treatment.  Tolerated Phlebotomy well.  Apple juice given post Phlebotomy.  Does not wish to eat anything more.

## 2013-05-20 ENCOUNTER — Encounter (HOSPITAL_COMMUNITY): Payer: Self-pay

## 2013-05-20 ENCOUNTER — Ambulatory Visit (HOSPITAL_COMMUNITY)
Admission: RE | Admit: 2013-05-20 | Discharge: 2013-05-20 | Disposition: A | Discharge status: Home / Self Care | Payer: Medicaid Other | Source: Ambulatory Visit | Attending: Internal Medicine | Admitting: Internal Medicine

## 2013-05-20 DIAGNOSIS — D751 Secondary polycythemia: Secondary | ICD-10-CM

## 2013-05-20 DIAGNOSIS — R161 Splenomegaly, not elsewhere classified: Secondary | ICD-10-CM | POA: Insufficient documentation

## 2013-05-20 DIAGNOSIS — D45 Polycythemia vera: Secondary | ICD-10-CM | POA: Insufficient documentation

## 2013-05-20 MED ORDER — IOHEXOL 300 MG/ML  SOLN
100.0000 mL | Freq: Once | INTRAMUSCULAR | Status: AC | PRN
  Administered 2013-05-20: 100 mL via INTRAVENOUS

## 2013-05-23 ENCOUNTER — Ambulatory Visit (HOSPITAL_BASED_OUTPATIENT_CLINIC_OR_DEPARTMENT_OTHER): Payer: Medicaid Other | Admitting: Internal Medicine

## 2013-05-23 ENCOUNTER — Ambulatory Visit (HOSPITAL_BASED_OUTPATIENT_CLINIC_OR_DEPARTMENT_OTHER): Payer: Medicaid Other

## 2013-05-23 ENCOUNTER — Other Ambulatory Visit (HOSPITAL_BASED_OUTPATIENT_CLINIC_OR_DEPARTMENT_OTHER): Payer: Medicaid Other

## 2013-05-23 ENCOUNTER — Telehealth: Payer: Self-pay | Admitting: Internal Medicine

## 2013-05-23 VITALS — BP 114/67 | HR 83 | Temp 98.2°F | Resp 20 | Ht 67.25 in | Wt 215.6 lb

## 2013-05-23 VITALS — BP 138/74 | HR 81

## 2013-05-23 DIAGNOSIS — D751 Secondary polycythemia: Secondary | ICD-10-CM

## 2013-05-23 DIAGNOSIS — D45 Polycythemia vera: Secondary | ICD-10-CM

## 2013-05-23 LAB — CBC WITH DIFFERENTIAL/PLATELET
BASO%: 0.7 % (ref 0.0–2.0)
Basophils Absolute: 0 10*3/uL (ref 0.0–0.1)
EOS%: 6.4 % (ref 0.0–7.0)
Eosinophils Absolute: 0.4 10*3/uL (ref 0.0–0.5)
HEMATOCRIT: 46.8 % (ref 38.4–49.9)
HGB: 15.1 g/dL (ref 13.0–17.1)
LYMPH%: 30.7 % (ref 14.0–49.0)
MCH: 27.7 pg (ref 27.2–33.4)
MCHC: 32.3 g/dL (ref 32.0–36.0)
MCV: 85.9 fL (ref 79.3–98.0)
MONO#: 0.4 10*3/uL (ref 0.1–0.9)
MONO%: 6.9 % (ref 0.0–14.0)
NEUT#: 3.4 10*3/uL (ref 1.5–6.5)
NEUT%: 55.3 % (ref 39.0–75.0)
PLATELETS: 213 10*3/uL (ref 140–400)
RBC: 5.45 10*6/uL (ref 4.20–5.82)
RDW: 13.2 % (ref 11.0–14.6)
WBC: 6.1 10*3/uL (ref 4.0–10.3)
lymph#: 1.9 10*3/uL (ref 0.9–3.3)

## 2013-05-23 LAB — BASIC METABOLIC PANEL (CC13)
Anion Gap: 9 mEq/L (ref 3–11)
BUN: 17.3 mg/dL (ref 7.0–26.0)
CALCIUM: 9.3 mg/dL (ref 8.4–10.4)
CO2: 25 mEq/L (ref 22–29)
CREATININE: 1.1 mg/dL (ref 0.7–1.3)
Chloride: 107 mEq/L (ref 98–109)
Glucose: 228 mg/dl — ABNORMAL HIGH (ref 70–140)
Potassium: 4.2 mEq/L (ref 3.5–5.1)
SODIUM: 141 meq/L (ref 136–145)

## 2013-05-23 NOTE — Progress Notes (Signed)
Pt. Had 2 unsuccessful phlebotomy attempts.  3rd attempt to Lt proximal antecubital site successful.  Pt. Tolerated procedure well. Total of 500g obtained added between 2nd and 3rd attempt.  Juice and snacks given.  HL

## 2013-05-23 NOTE — Telephone Encounter (Signed)
gve the pt his oct 2015 appt calendar

## 2013-05-23 NOTE — Patient Instructions (Signed)

## 2013-05-23 NOTE — Patient Instructions (Signed)
Therapeutic Phlebotomy Therapeutic phlebotomy is the controlled removal of blood from your body for the purpose of treating a medical condition. It is similar to donating blood. Usually, about a pint (470 mL) of blood is removed. The average adult has 9 to 12 pints (4.3 to 5.7 L) of blood. Therapeutic phlebotomy may be used to treat the following medical conditions:  Hemochromatosis. This is a condition in which there is too much iron in the blood.  Polycythemia vera. This is a condition in which there are too many red cells in the blood.  Porphyria cutanea tarda. This is a disease usually passed from one generation to the next (inherited). It is a condition in which an important part of hemoglobin is not made properly. This results in the build up of abnormal amounts of porphyrins in the body.  Sickle cell disease. This is an inherited disease. It is a condition in which the red blood cells form an abnormal crescent shape rather than a round shape. LET YOUR CAREGIVER KNOW ABOUT:  Allergies.  Medicines taken including herbs, eyedrops, over-the-counter medicines, and creams.  Use of steroids (by mouth or creams).  Previous problems with anesthetics or numbing medicine.  History of blood clots.  History of bleeding or blood problems.  Previous surgery.  Possibility of pregnancy, if this applies. RISKS AND COMPLICATIONS This is a simple and safe procedure. Problems are unlikely. However, problems can occur and may include:  Nausea or lightheadedness.  Low blood pressure.  Soreness, bleeding, swelling, or bruising at the needle insertion site.  Infection. BEFORE THE PROCEDURE  This is a procedure that can be done as an outpatient. Confirm the time that you need to arrive for your procedure. Confirm whether there is a need to fast or withhold any medications. It is helpful to wear clothing with sleeves that can be raised above the elbow. A blood sample may be done to determine the  amount of red blood cells or iron in your blood. Plan ahead of time to have someone drive you home after the procedure. PROCEDURE The entire procedure from preparation through recovery takes about 1 hour. The actual collection takes about 10 to 15 minutes.  A needle will be inserted into your vein.  Tubing and a collection bag will be attached to that needle.  Blood will flow through the needle and tubing into the collection bag.  You may be asked to open and close your hand slowly and continuously during the entire collection.  Once the specified amount of blood has been removed from your body, the collection bag and tubing will be clamped.  The needle will be removed.  Pressure will be held on the site of the needle insertion to stop the bleeding. Then a bandage will be placed over the needle insertion site. AFTER THE PROCEDURE  Your recovery will be assessed and monitored. If there are no problems, as an outpatient, you should be able to go home shortly after the procedure.  Document Released: 06/10/2010 Document Revised: 03/31/2011 Document Reviewed: 06/10/2010 [employer] Patient Information 2014 [employer].  

## 2013-05-23 NOTE — Progress Notes (Signed)
[place], [place] [address]  DIAGNOSIS: No diagnosis found.  Chief Complaint  Patient presents with  . Follow-up    CURRENT THERAPY: Phlebotomy to maintain hct less than 45%.    INTERVAL HISTORY: Emmitt Eilene Ghazi Flores 53 y.o. male  who is here for further follow up for erythrocytosis.  He was last seen by me on 02/28/2013. Of note, he has an extensive cardiac history including CAD s/p CABG in 2010, Hypertension and hyperlipidemia for which he is being managed by cardiology. He had a lexiscan myoview recently due to test discomfort which was revealed the following: Low risk stress nuclear study with a moderate size, medium intensity, partially reversible inferior defect consistent with inferior thinning vs prior infarct and mild inferior ischemia. LV Ejection Fraction: 52%. LV Wall Motion: NL LV Function; NL Wall Motion. He was last seen by Dr. Sherren Mocha 12/2012.   He denies being told he had an elevated hemoglobin on prior CBCs. Looking through his lab values, he has had significant fluctuations with his hct being 53.6 to as low as 39 on 12/30/2010 to as high as 57.7 on 01/10/2013. He also denied feeling very itchy after taking a shower or bathing. He denied burning pain in his feet or cyanosis or discolorations. He also denied chest and abdominal pain, myalgia and weakness, fatigue, headache, blurred vision, transient loss of vision, paresthesias, slow mentation. He denied a history of blood clots or gout. He smoked about 6 cigarettes daily and smoked for many years prior. He denied any dysuria or hematuria or weight changes. He denied living in high altitudes or the use of testosterone.   Today, he is accompanied by an interpreter.  He is planning a return trip to [place] next month for three months.  MEDICAL HISTORY: Past Medical History  Diagnosis Date  . Hypertension   . Diabetes mellitus   . Asthma    . Hyperlipidemia   . Coronary artery disease     CABG 12/12 (LIMA-LAD, SVG-DX, SVG-OM, SVG-PDA) c/b post op AFib Rx with amio Rx. s/p successful PCI of the right coronary artery utilizing overlapping drug-eluting stents in the mid and distal vessel (done after abnormal nuc)  . Chest pain     01/2011 in setting of HTN urgency: Chest CT demonstrated no pulmonary embolus, postoperative appearance following CABG and small left pleural effusion and dependent atelectasis in the left lung  . Elevated hemoglobin     INTERIM HISTORY: has S/P CABG x 4; Coronary atherosclerosis of native coronary artery; Essential hypertension, benign; Mixed hyperlipidemia; Atrial fibrillation; Chest pain; Hypertensive urgency; Diabetes mellitus; Palpitations; and Erythrocytosis on his problem list.    ALLERGIES:  has No Known Allergies.  MEDICATIONS: has a current medication list which includes the following prescription(s): amlodipine, aspirin, carvedilol, clopidogrel, enalapril, hydrochlorothiazide, metformin, nitroglycerin, potassium chloride sa, and pravastatin.  SURGICAL HISTORY:  Past Surgical History  Procedure Laterality Date  . Cardiac catheterization    . Coronary artery bypass graft  12/30/2010    Procedure: CORONARY ARTERY BYPASS GRAFTING (CABG);  Surgeon: Grace Isaac, MD;  Location: [facility];  Service: Open Heart Surgery;  Laterality: N/A;    REVIEW OF SYSTEMS:   Constitutional: Denies fevers, chills or abnormal weight loss Eyes: Denies blurriness of vision Ears, nose, mouth, throat, and face: Denies mucositis or sore throat Respiratory: Denies cough, dyspnea or wheezes Cardiovascular: Denies palpitation, chest discomfort or lower extremity swelling Gastrointestinal:  Denies nausea, heartburn or change in bowel habits Skin: Denies abnormal skin rashes Lymphatics: Denies new lymphadenopathy or easy bruising Neurological:Denies numbness, tingling or new weaknesses Behavioral/Psych: Mood is  stable, no new changes  All other systems were reviewed with the patient and are negative.  PHYSICAL EXAMINATION: ECOG PERFORMANCE STATUS: 0 - Asymptomatic  Blood pressure 114/67, pulse 83, temperature 98.2 F (36.8 C), temperature source Oral, resp. rate 20, height 5' 7.25" (1.708 m), weight 215 lb 9.6 oz (97.796 kg), SpO2 99.00%.  GENERAL:alert, no distress and comfortable; moderately obese SKIN: skin color, texture, turgor are normal, no rashes or significant lesions EYES: normal, Conjunctiva are pink and non-injected, sclera clear OROPHARYNX:no exudate, no erythema and lips, buccal mucosa, and tongue normal  NECK: supple, thyroid normal size, non-tender, without nodularity LYMPH:  no palpable lymphadenopathy in the cervical, axillary or supraclavicular LUNGS: clear to auscultation and percussion with normal breathing effort HEART: regular rate & rhythm and no murmurs and no lower extremity edema ABDOMEN:abdomen soft, non-tender and normal bowel sounds Musculoskeletal:no cyanosis of digits and no clubbing  NEURO: alert & oriented x 3 with fluent speech, no focal motor/sensory deficits  LABORATORY DATA: Results for orders placed in visit on 05/23/13 (from the past 48 hour(s))  CBC WITH DIFFERENTIAL     Status: None   Collection Time    05/23/13  8:31 AM      Result Value Ref Range   WBC 6.1  4.0 - 10.3 10e3/uL   NEUT# 3.4  1.5 - 6.5 10e3/uL   HGB 15.1  13.0 - 17.1 g/dL   HCT 46.8  38.4 - 49.9 %   Platelets 213  140 - 400 10e3/uL   MCV 85.9  79.3 - 98.0 fL   MCH 27.7  27.2 - 33.4 pg   MCHC 32.3  32.0 - 36.0 g/dL   RBC 5.45  4.20 - 5.82 10e6/uL   RDW 13.2  11.0 - 14.6 %   lymph# 1.9  0.9 - 3.3 10e3/uL   MONO# 0.4  0.1 - 0.9 10e3/uL   Eosinophils Absolute 0.4  0.0 - 0.5 10e3/uL   Basophils Absolute 0.0  0.0 - 0.1 10e3/uL   NEUT% 55.3  39.0 - 75.0 %   LYMPH% 30.7  14.0 - 49.0 %   MONO% 6.9  0.0 - 14.0 %   EOS% 6.4  0.0 - 7.0 %   BASO% 0.7  0.0 - 2.0 %  BASIC METABOLIC  PANEL (VQ00)     Status: Abnormal   Collection Time    05/23/13  8:34 AM      Result Value Ref Range   Sodium 141  136 - 145 mEq/L   Potassium 4.2  3.5 - 5.1 mEq/L   Chloride 107  98 - 109 mEq/L   CO2 25  22 - 29 mEq/L   Glucose 228 (*) 70 - 140 mg/dl   BUN 17.3  7.0 - 26.0 mg/dL   Creatinine 1.1  0.7 - 1.3 mg/dL   Calcium 9.3  8.4 - 10.4 mg/dL   Anion Gap 9  3 - 11 mEq/L    Labs:  Lab Results  Component Value Date   WBC 6.1 05/23/2013   HGB 15.1 05/23/2013   HCT 46.8 05/23/2013   MCV 85.9 05/23/2013   PLT 213 05/23/2013   NEUTROABS 3.4 05/23/2013      Chemistry      Component Value Date/Time   NA 141 05/23/2013 0834   NA 138 01/10/2013 1601   K  4.2 05/23/2013 0834   K 4.0 01/10/2013 1601   CL 97 01/10/2013 1601   CO2 25 05/23/2013 0834   CO2 34* 01/10/2013 1601   BUN 17.3 05/23/2013 0834   BUN 16 01/10/2013 1601   CREATININE 1.1 05/23/2013 0834   CREATININE 1.0 01/10/2013 1601      Component Value Date/Time   CALCIUM 9.3 05/23/2013 0834   CALCIUM 10.0 01/10/2013 1601   ALKPHOS 60 02/28/2013 1447   ALKPHOS 65 01/04/2013 0754   AST 20 02/28/2013 1447   AST 22 01/04/2013 0754   ALT 36 02/28/2013 1447   ALT 40 01/04/2013 0754   BILITOT 1.14 02/28/2013 1447   BILITOT 1.1 01/04/2013 0754      CBC:  Recent Labs Lab 05/23/13 0831  WBC 6.1  NEUTROABS 3.4  HGB 15.1  HCT 46.8  MCV 85.9  PLT 213   Studies:  No results found.   RADIOGRAPHIC STUDIES: 05/20/2013 CT ABDOMEN WITH CONTRAST TECHNIQUE:  Multidetector CT imaging of the abdomen was performed using the standard protocol following bolus administration of intravenous contrast. CONTRAST: OMNIPAQUE IOHEXOL 300 MG/ML SOLN  COMPARISON: 04/12/2006 FINDINGS: The lung bases appear clear. No pleural or pericardial effusion.  No focal liver abnormality identified. The gallbladder is normal. There is no biliary dilatation. The spleen is unremarkable measuring 10.2 cm in length. The adrenal glands are both normal. The right kidney is  normal. Cyst within the upper pole of the left kidney is identified measuring 1.2 cm. Normal caliber of the abdominal aorta. No aneurysm. There is no upper abdominal adenopathy identified. The stomach is normal. The small bowel loops are unremarkable. Normal appearance of the colon. Review of the visualized osseous structures is significant for mild spondylosis. IMPRESSION: 1. No acute findings identified within the upper abdomen. 2. No mass or adenopathy and no evidence for splenomegaly.    ASSESSMENT: Keith Flores 53 y.o. male with a history of No diagnosis found.   Ellwood Threasa Beards is a 53 y.o. 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 He also has an elevated vitamin b12. CT of Abdomen (2008) was negative for splenomegaly. JAK2 was negative. Normal EPO level. He denies classic aquagenic pruritus, negative for erythromegalalia. Secondary causes due to RCC, HCC and steroids/testerone are felt to be less likely.   PLAN:  --On initial consultation, his smear was reviewed by me and did not reveal abnormal white blood cells or blasts. Pulse oximetry and/or arterial oxygen saturation at rest and after brisk walk was 96%.  --He met 2 major PCV study group criteria including an arterial oxygen greater than or equal to 92% and hemoglobin greater than 18.5. In addition he meets one minor PCV criteria with vitamin B12 greater than 900. Other minor PCV criteria including elevation of plt greater than 400,000 or WBC greater than 12,000 he did not meet. Other criteria which includes a bone marrow biopsy and erythropoietin levels can also be used such as the revised WHO criteria. It requires elevated hbg as noted above plus a positive JAK2 and one of the following: findings on the bone marrow biopsy and a low EPO level. However, he has a normal EPO level (subnormal in nearly 80% of the cases) and  does not have splenomegaly on physical exam (though limited due to obesity). His spleen size was normal by CT of abdomen and pelvis on 04/12/2006 as noted on scan on 05/20/2013. In addition 95  to percent of people with polycythemia vera positive JAK2. We sent out for JAK2 testing and found to be negative. 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 We will obtain a CT of Abdomen with contrast to rule out splenomegaly and RCC. It is also negative as noted on scan above.   --Although his JAK2 is negative, we will perform phlebetomy with a goal less than 45% for the Hematocrit based on the CYTO-PV trial ([place] R, NEJM 2013). One unit phlebetomy (500 mL) should drop the hematocrit by 3%. Based on his age less than 62 placing him at a lower thrombotic risk, we will hold the addition of hydroxyurea daily as an adjunctive therapy. An overview of polycythemia was provided to him as handouts. We also discussed a bone marrow biopsy may be needed in the future.  -- I reiterated the importance of compliance with his daily baby aspirin (81 mg) daily.   2. Follow-up.  He should follow up with our clinic in one month for CBC and chemistries and October (he will travel to [place] on 06/2013 through 09/2013). He will continue phebotomy as detailed above upon return from his visit. .   All questions were answered. The patient knows to call the clinic with any problems, questions or concerns. We can certainly see the patient much sooner if necessary.  I spent 15 minutes counseling the patient face to face in the presence of an arabic intepreter. The total time spent in the appointment was 25 minutes.    Concha Norway, MD 05/23/2013 9:17 AM

## 2013-06-21 ENCOUNTER — Telehealth: Payer: Self-pay | Admitting: Cardiovascular Disease

## 2013-06-21 ENCOUNTER — Telehealth: Payer: Self-pay | Admitting: *Deleted

## 2013-06-21 ENCOUNTER — Other Ambulatory Visit: Payer: Self-pay

## 2013-06-21 DIAGNOSIS — I2510 Atherosclerotic heart disease of native coronary artery without angina pectoris: Secondary | ICD-10-CM

## 2013-06-21 DIAGNOSIS — R079 Chest pain, unspecified: Secondary | ICD-10-CM

## 2013-06-21 DIAGNOSIS — I10 Essential (primary) hypertension: Secondary | ICD-10-CM

## 2013-06-21 MED ORDER — CARVEDILOL 25 MG PO TABS: 25.0000 mg | ORAL_TABLET | Freq: Two times a day (BID) | ORAL | Status: DC

## 2013-06-21 MED ORDER — POTASSIUM CHLORIDE CRYS ER 20 MEQ PO TBCR: 20.0000 meq | EXTENDED_RELEASE_TABLET | ORAL | Status: DC | PRN

## 2013-06-21 MED ORDER — ENALAPRIL MALEATE 10 MG PO TABS: 10.0000 mg | ORAL_TABLET | Freq: Two times a day (BID) | ORAL | Status: DC

## 2013-06-21 MED ORDER — PRAVASTATIN SODIUM 40 MG PO TABS: 40.0000 mg | ORAL_TABLET | Freq: Every evening | ORAL | Status: DC

## 2013-06-21 MED ORDER — HYDROCHLOROTHIAZIDE 25 MG PO TABS: 25.0000 mg | ORAL_TABLET | Freq: Every day | ORAL | Status: DC

## 2013-06-21 MED ORDER — NITROGLYCERIN 0.4 MG SL SUBL: 0.4000 mg | SUBLINGUAL_TABLET | SUBLINGUAL | Status: DC | PRN

## 2013-06-21 MED ORDER — CLOPIDOGREL BISULFATE 75 MG PO TABS: 75.0000 mg | ORAL_TABLET | Freq: Every day | ORAL | Status: DC

## 2013-06-21 MED ORDER — AMLODIPINE BESYLATE 10 MG PO TABS
10.0000 mg | ORAL_TABLET | Freq: Every day | ORAL | Status: DC
Start: 2013-06-21 — End: 2014-01-18

## 2013-06-21 MED ORDER — METFORMIN HCL 1000 MG PO TABS: 1000.0000 mg | ORAL_TABLET | Freq: Every day | ORAL | Status: DC

## 2013-06-21 NOTE — Telephone Encounter (Signed)
I left a message on the pt's voicemail that I have sent a 90 day supply on all medications to pharmacy [facility]) on file.

## 2013-06-21 NOTE — Telephone Encounter (Signed)
Call received from [facility] with the [facility].  Patient is there for financial assistance, about to leave the country and asking if Dr. Juliann Mule can refill his medications.  This nurse expressed this office following patient for blood disorder.  All medications filled by Dr. Burt Knack cardiologist on [place].  [phone].  This name and number given to dante who will call this office.

## 2013-06-21 NOTE — Telephone Encounter (Signed)
[facility]. Patient has a family emergency and will be leaving the country tomorrow. He is out of all his medication. He will be gone for 2 1/2 months. Please call patient with any questions [phone].

## 2013-06-22 ENCOUNTER — Ambulatory Visit: Payer: Medicaid Other | Admitting: Cardiovascular Disease

## 2013-09-26 ENCOUNTER — Encounter (HOSPITAL_COMMUNITY): Payer: Self-pay | Admitting: Emergency Medicine

## 2013-09-26 ENCOUNTER — Emergency Department (HOSPITAL_COMMUNITY)
Admission: EM | Admit: 2013-09-26 | Discharge: 2013-09-27 | Disposition: A | Discharge status: Home / Self Care | Payer: Self-pay | Attending: Emergency Medicine | Admitting: Emergency Medicine

## 2013-09-26 DIAGNOSIS — R52 Pain, unspecified: Secondary | ICD-10-CM | POA: Insufficient documentation

## 2013-09-26 DIAGNOSIS — Z951 Presence of aortocoronary bypass graft: Secondary | ICD-10-CM | POA: Insufficient documentation

## 2013-09-26 DIAGNOSIS — M5441 Lumbago with sciatica, right side: Secondary | ICD-10-CM

## 2013-09-26 DIAGNOSIS — E785 Hyperlipidemia, unspecified: Secondary | ICD-10-CM | POA: Insufficient documentation

## 2013-09-26 DIAGNOSIS — Z7982 Long term (current) use of aspirin: Secondary | ICD-10-CM | POA: Insufficient documentation

## 2013-09-26 DIAGNOSIS — Z7902 Long term (current) use of antithrombotics/antiplatelets: Secondary | ICD-10-CM | POA: Insufficient documentation

## 2013-09-26 DIAGNOSIS — M545 Low back pain, unspecified: Secondary | ICD-10-CM | POA: Insufficient documentation

## 2013-09-26 DIAGNOSIS — I2510 Atherosclerotic heart disease of native coronary artery without angina pectoris: Secondary | ICD-10-CM | POA: Insufficient documentation

## 2013-09-26 DIAGNOSIS — E119 Type 2 diabetes mellitus without complications: Secondary | ICD-10-CM | POA: Insufficient documentation

## 2013-09-26 DIAGNOSIS — Z9889 Other specified postprocedural states: Secondary | ICD-10-CM | POA: Insufficient documentation

## 2013-09-26 DIAGNOSIS — R209 Unspecified disturbances of skin sensation: Secondary | ICD-10-CM | POA: Insufficient documentation

## 2013-09-26 DIAGNOSIS — J45909 Unspecified asthma, uncomplicated: Secondary | ICD-10-CM | POA: Insufficient documentation

## 2013-09-26 DIAGNOSIS — F17200 Nicotine dependence, unspecified, uncomplicated: Secondary | ICD-10-CM | POA: Insufficient documentation

## 2013-09-26 DIAGNOSIS — G8929 Other chronic pain: Secondary | ICD-10-CM | POA: Insufficient documentation

## 2013-09-26 DIAGNOSIS — Z79899 Other long term (current) drug therapy: Secondary | ICD-10-CM | POA: Insufficient documentation

## 2013-09-26 DIAGNOSIS — M5430 Sciatica, unspecified side: Secondary | ICD-10-CM | POA: Insufficient documentation

## 2013-09-26 DIAGNOSIS — I10 Essential (primary) hypertension: Secondary | ICD-10-CM | POA: Insufficient documentation

## 2013-09-26 MED ORDER — HYDROCODONE-ACETAMINOPHEN 5-325 MG PO TABS
2.0000 | ORAL_TABLET | Freq: Once | ORAL | Status: AC
  Administered 2013-09-27: 2 via ORAL
  Filled 2013-09-26: qty 2

## 2013-09-26 NOTE — ED Provider Notes (Signed)
CSN: [id number]     Arrival date & time 09/26/13  2314 History   First MD Initiated Contact with Patient 09/26/13 2318     Chief Complaint  Patient presents with  . Back Pain     (Consider location/radiation/quality/duration/timing/severity/associated sxs/prior Treatment) HPI  Keith Flores is a 53 y.o. male with a past medical history of coronary artery disease status post CABG in 2012, hypertension, diabetes presenting today with acute on chronic low back pain. Per EMS his back pain began in February. History was taken with the assistance of a translator friend in the room. The patient states it has gotten worse over the last 2 days and exquisitely painful today. He takes Motrin and aspirin at home for pain relief which has not been helping.  Pain is worse in the bilateral paralumbar region, with radiation down his right leg to his toes. He describes as electricity. Pain is worse with movement, especially bending over. Patient has paresthesias in his right foot, which she says is present consistent with his diabetes. He denies any saddle anesthesia or incontinence. There's been no recent history of trauma, he has no history of malignancy. He denies any fevers recent infections. Patient also complained of left-sided chest pain. He states it is intermittent, on the left side, nonexertional, without any radiation, and lasts for a few seconds and spontaneously resolves. He states this is not consistent with the pain he had from his prior MI. He has no emesis or diaphoresis associated with this pain.  10 Systems reviewed and are negative for acute change except as noted in the HPI.     Past Medical History  Diagnosis Date  . Hypertension   . Diabetes mellitus   . Asthma   . Hyperlipidemia   . Coronary artery disease     CABG 12/12 (LIMA-LAD, SVG-DX, SVG-OM, SVG-PDA) c/b post op AFib Rx with amio Rx. s/p successful PCI of the right coronary artery utilizing overlapping drug-eluting  stents in the mid and distal vessel (done after abnormal nuc)  . Chest pain     01/2011 in setting of HTN urgency: Chest CT demonstrated no pulmonary embolus, postoperative appearance following CABG and small left pleural effusion and dependent atelectasis in the left lung  . Elevated hemoglobin    Past Surgical History  Procedure Laterality Date  . Cardiac catheterization    . Coronary artery bypass graft  12/30/2010    Procedure: CORONARY ARTERY BYPASS GRAFTING (CABG);  Surgeon: Grace Isaac, MD;  Location: [facility];  Service: Open Heart Surgery;  Laterality: N/A;   Family History  Problem Relation Age of Onset  . Anesthesia problems Neg Hx   . Hypotension Neg Hx   . Malignant hyperthermia Neg Hx   . Pseudochol deficiency Neg Hx    History  Substance Use Topics  . Smoking status: Current Some Day Smoker    Last Attempt to Quit: 10/21/2010  . Smokeless tobacco: Never Used  . Alcohol Use: No    Review of Systems    Allergies  Review of patient's allergies indicates no known allergies.  Home Medications   Prior to Admission medications   Medication Sig Start Date End Date Taking? Authorizing Provider  amLODipine (NORVASC) 10 MG tablet Take 1 tablet (10 mg total) by mouth daily. 06/21/13   Sherren Mocha, MD  aspirin 81 MG tablet Take 81 mg by mouth daily. 03/08/11   Dayna N Dunn, PA-C  carvedilol (COREG) 25 MG tablet Take 1 tablet (25 mg  total) by mouth 2 (two) times daily. 06/21/13   Sherren Mocha, MD  clopidogrel (PLAVIX) 75 MG tablet Take 1 tablet (75 mg total) by mouth daily. 06/21/13   Sherren Mocha, MD  enalapril (VASOTEC) 10 MG tablet Take 1 tablet (10 mg total) by mouth 2 (two) times daily. 06/21/13   Sherren Mocha, MD  hydrochlorothiazide (HYDRODIURIL) 25 MG tablet Take 1 tablet (25 mg total) by mouth daily. 06/21/13 06/21/14  Sherren Mocha, MD  metFORMIN (GLUCOPHAGE) 1000 MG tablet Take 1 tablet (1,000 mg total) by mouth daily after lunch. 06/21/13   Sherren Mocha, MD   nitroGLYCERIN (NITROSTAT) 0.4 MG SL tablet Place 1 tablet (0.4 mg total) under the tongue every 5 (five) minutes as needed. For chest pain 06/21/13 06/21/14  Sherren Mocha, MD  potassium chloride SA (K-DUR,KLOR-CON) 20 MEQ tablet Take 1 tablet (20 mEq total) by mouth as needed. 06/21/13 06/21/14  Sherren Mocha, MD  pravastatin (PRAVACHOL) 40 MG tablet Take 1 tablet (40 mg total) by mouth every evening. 06/21/13 06/21/14  Sherren Mocha, MD   There were no vitals taken for this visit. Physical Exam  Nursing note and vitals reviewed. Constitutional: He is oriented to person, place, and time. Vital signs are normal. He appears well-developed and well-nourished.  Non-toxic appearance. He does not appear ill. No distress.  HENT:  Head: Normocephalic and atraumatic.  Nose: Nose normal.  Mouth/Throat: Oropharynx is clear and moist. No oropharyngeal exudate.  Eyes: Conjunctivae and EOM are normal. Pupils are equal, round, and reactive to light. No scleral icterus.  Neck: Normal range of motion. Neck supple. No tracheal deviation, no edema, no erythema and normal range of motion present. No mass and no thyromegaly present.  Cardiovascular: Normal rate, regular rhythm, S1 normal, S2 normal, normal heart sounds, intact distal pulses and normal pulses.  Exam reveals no gallop and no friction rub.   No murmur heard. Pulses:      Radial pulses are 2+ on the right side, and 2+ on the left side.       Dorsalis pedis pulses are 2+ on the right side, and 2+ on the left side.  Pulmonary/Chest: Effort normal and breath sounds normal. No respiratory distress. He has no wheezes. He has no rhonchi. He has no rales.  Abdominal: Soft. Normal appearance and bowel sounds are normal. He exhibits no distension, no ascites and no mass. There is no hepatosplenomegaly. There is no tenderness. There is no rebound, no guarding and no CVA tenderness.  Genitourinary:  No saddle anesthesia.  Musculoskeletal: Normal range of motion. He  exhibits no edema and no tenderness.  2 icy hot patches applied to the bilateral paralumbar region. No significant skin findings underneath. Positive straight leg raise test on the right, positive contralateral leg rate test. Patient has normal range of motion of his back and hip.  Lymphadenopathy:    He has no cervical adenopathy.  Neurological: He is alert and oriented to person, place, and time. He has normal strength. No cranial nerve deficit or sensory deficit. He exhibits normal muscle tone. Coordination normal. GCS eye subscore is 4. GCS verbal subscore is 5. GCS motor subscore is 6.  5 out of 5 strength x4 extremities. Paresthesias to right lower extremity, otherwise normal  Skin: Skin is warm, dry and intact. No petechiae and no rash noted. He is not diaphoretic. No erythema. No pallor.  Psychiatric: He has a normal mood and affect. His behavior is normal. Judgment normal.    ED Course  Procedures (  including critical care time) Labs Review Labs Reviewed - No data to display  Imaging Review Dg Lumbar Spine Complete  09/27/2013   CLINICAL DATA:  Fall, low back pain  EXAM: LUMBAR SPINE - COMPLETE 4+ VIEW  COMPARISON:  CT abdomen pelvis dated 05/20/2013  FINDINGS: Five lumbar type vertebral bodies.  Normal lumbar lordosis.  No evidence of fracture or dislocation. Vertebral body heights are maintained.  Moderate multilevel degenerative changes.  Visualized bony pelvis appears intact.  IMPRESSION: No fracture or dislocation is seen.  Moderate multilevel degenerative changes.   Electronically Signed   By: Julian Hy M.D.   On: 09/27/2013 00:54     EKG Interpretation None      MDM   Final diagnoses:  None    Patient presents today a concern for worsening low back pain. This appears to be a acute on chronic pain, he normally takes Motrin and aspirin at home for relief. He states this got significantly worse after a flight returning back from [place]. Do not see any imaging in his  chart, I will obtain a lumbar x-ray. Patient was given pain medication the emergency department. Patient's chest pain is not concerning for ACS. He states it feels different from his previous MRI, there is no exertional component, is no radiation, and he denies any vomiting or diaphoresis associated with it.  Upon my repeat assessment patient's pain is significantly resolved. I informed him of x-ray findings being negative, and discussed possibility of getting an outpatient MRI. Patient is advised to followup with primary care physician within 3 days for continued care and possible initiation of nerve pain medication. Vital signs remain within his normal limits and he is stable for discharge. He was given a prescription for Lita Mains, MD 09/27/13 [phone]

## 2013-09-26 NOTE — ED Notes (Signed)
Per EMS, Pt has been having occasional back pain x several months that became very severe today. Pt states he has tingling in legs and difficulty walking, denies incontinence.

## 2013-09-27 ENCOUNTER — Emergency Department (HOSPITAL_COMMUNITY): Payer: Self-pay

## 2013-09-27 MED ORDER — HYDROCODONE-ACETAMINOPHEN 5-325 MG PO TABS: 1.0000 | ORAL_TABLET | Freq: Two times a day (BID) | ORAL | Status: DC | PRN

## 2013-09-27 NOTE — ED Notes (Signed)
Pt to xray

## 2013-09-27 NOTE — ED Notes (Signed)
Pt back from x-ray.

## 2013-09-27 NOTE — Discharge Instructions (Signed)
Sciatica Keith Flores, you were seen today for back pain going on since February.  You need to follow up with your regular doctor within 3 days for continued care and treatment.  They may be able to put you on pain medication for nerve pain.  Continue to take pain medication at home as needed until then. Return to the ED for any worsening symptoms including back pain, numbness, urinating or having bowel movements on yourself, or fevers.  Thank you. Sciatica is pain, weakness, numbness, or tingling along your sciatic nerve. The nerve starts in the lower back and runs down the back of each leg. Nerve damage or certain conditions pinch or put pressure on the sciatic nerve. This causes the pain, weakness, and other discomforts of sciatica. HOME CARE   Only take medicine as told by your doctor.  Apply ice to the affected area for 20 minutes. Do this 3-4 times a day for the first 48-72 hours. Then try heat in the same way.  Exercise, stretch, or do your usual activities if these do not make your pain worse.  Go to physical therapy as told by your doctor.  Keep all doctor visits as told.  Do not wear high heels or shoes that are not supportive.  Get a firm mattress if your mattress is too soft to lessen pain and discomfort. GET HELP RIGHT AWAY IF:   You cannot control when you poop (bowel movement) or pee (urinate).  You have more weakness in your lower back, lower belly (pelvis), butt (buttocks), or legs.  You have redness or puffiness (swelling) of your back.  You have a burning feeling when you pee.  You have pain that gets worse when you lie down.  You have pain that wakes you from your sleep.  Your pain is worse than past pain.  Your pain lasts longer than 4 weeks.  You are suddenly losing weight without reason. MAKE SURE YOU:   Understand these instructions.  Will watch this condition.  Will get help right away if you are not doing well or get worse. Document Released:  10/16/2007 Document Revised: 07/08/2011 Document Reviewed: 05/18/2011 [facility] Patient Information 2015 [place], [place]. This information is not intended to replace advice given to you by your health care provider. Make sure you discuss any questions you have with your health care provider. Chronic Back Pain  When back pain lasts longer than 3 months, it is called chronic back pain.People with chronic back pain often go through certain periods that are more intense (flare-ups).  CAUSES Chronic back pain can be caused by wear and tear (degeneration) on different structures in your back. These structures include:  The bones of your spine (vertebrae) and the joints surrounding your spinal cord and nerve roots (facets).  The strong, fibrous tissues that connect your vertebrae (ligaments). Degeneration of these structures may result in pressure on your nerves. This can lead to constant pain. HOME CARE INSTRUCTIONS  Avoid bending, heavy lifting, prolonged sitting, and activities which make the problem worse.  Take brief periods of rest throughout the day to reduce your pain. Lying down or standing usually is better than sitting while you are resting.  Take over-the-counter or prescription medicines only as directed by your caregiver. SEEK IMMEDIATE MEDICAL CARE IF:   You have weakness or numbness in one of your legs or feet.  You have trouble controlling your bladder or bowels.  You have nausea, vomiting, abdominal pain, shortness of breath, or fainting. Document Released: 02/14/2004  Document Revised: 03/31/2011 Document Reviewed: 12/21/2010 [facility] Patient Information 2015 [place], [place]. This information is not intended to replace advice given to you by your health care provider. Make sure you discuss any questions you have with your health care provider.      Emergency Department Resource Guide 1) Find a Doctor and Pay Out of Pocket Although you won't have to find out who is covered  by your insurance plan, it is a good idea to ask around and get recommendations. You will then need to call the office and see if the doctor you have chosen will accept you as a new patient and what types of options they offer for patients who are self-pay. Some doctors offer discounts or will set up payment plans for their patients who do not have insurance, but you will need to ask so you aren't surprised when you get to your appointment.  2) Contact Your Local Health Department Not all health departments have doctors that can see patients for sick visits, but many do, so it is worth a call to see if yours does. If you don't know where your local health department is, you can check in your phone book. The CDC also has a tool to help you locate your state's health department, and many state websites also have listings of all of their local health departments.  3) Find a [facility] If your illness is not likely to be very severe or complicated, you may want to try a walk in clinic. These are popping up all over the country in pharmacies, drugstores, and shopping centers. They're usually staffed by nurse practitioners or physician assistants that have been trained to treat common illnesses and complaints. They're usually fairly quick and inexpensive. However, if you have serious medical issues or chronic medical problems, these are probably not your best option.  No Primary Care Doctor: - Call Health Connect at  [phone] - they can help you locate a primary care doctor that  accepts your insurance, provides certain services, etc. - Physician Referral Service- [phone]  Chronic Pain Problems: Organization         Address  Phone   Notes  [facility]  [phone] Patients need to be referred by their primary care doctor.   Medication Assistance: Organization         Address  Phone   Notes  [facility] Medication [facility] [address] [place], [place] [address] [phone] --Must be a resident of [facility] -- Must have NO insurance coverage whatsoever (no Medicaid/ Medicare, etc.) -- The pt. MUST have a primary care doctor that directs their care regularly and follows them in the community   MedAssist  [phone]   [employer]  [phone]    Agencies that provide inexpensive medical care: Organization         Address  Phone   Notes  [place]  [phone]   [facility]    [phone]   [facility] [place], [place] [address] [phone]   [place] [address] [place] [phone]   Planned Parenthood    [phone]   [facility]    [phone]   [place] and [place] [address], [place] Phone:  [phone], Fax:  [phone] Hours of Operation:  9 am - 6 pm, M-F.  Also accepts Medicaid/Medicare and self-pay.  [facility] for [address], [address], [place] Phone: [phone], Fax: [phone]. Hours of Operation:  8:30 am - 5:30 pm, M-F.  Also accepts Medicaid and self-pay.  [facility] [place] [address] [place] Phone: [phone]   [place], [place], [place] [phone], Ext. [phone] Mondays & Thursdays: 7-9 AM.  First 15 patients are seen on a first come, first serve basis.    [place] Providers:  Organization         Address  Phone   Notes  [facility] [place] [address], [place] [phone] Also accepts self-pay patients.  [facility] [address], [place]  [phone]   [place], [address], [place] [phone]   [facility] [address] [place] [phone]   Lucianne Lei [address], [place]   [phone] Only accepts [place] Access [place] patients after they have their name applied to their card.   Self-Pay (no insurance) in [facility]:  Organization         Address  Phone   Notes  Sickle Cell Patients, [facility] [place] [phone]   [facility]gent Care [place] [phone]   [facility] [place]  [place], [place], [place] [phone]   [facility]/Dr. Osei-Bonsu  [address], [place] or [address], [place] [phone] Phone number for both [place] and [place] locations is the same.  Urgent Medical and [facility] [address] [place] [phone]   [facility] [address], [place] or [address] [phone]   [facility] [address], [place] [phone], phone; [phone], fax Sees patients 1st and 3rd Saturday of every month.  Must not qualify for public or private insurance (i.e. Medicaid, Medicare, [place] Health Choice, Veterans' Benefits)  Household income should be no more than 200% of the poverty level The clinic cannot treat you if you are pregnant or think you are pregnant  Sexually transmitted diseases are not treated at the clinic.    Dental Care: Organization         Address  Phone  Notes  [facility] Department of [facility] [place] [phone] Accepts children up to age 37 who are enrolled in [place] or [place]; pregnant women with a Medicaid card; and children who have applied for Medicaid or [place] Health Choice, but were declined, whose parents can pay a reduced fee at time of service.  [facility] Department of [facility]  [address], [place] [phone] Accepts children up to age 49 who are enrolled  in [place] or [place]; pregnant women with a Medicaid card; and children who have applied for Medicaid or [place] Health Choice, but were declined, whose parents can pay a reduced fee at time of service.  [place] Adult Dental Access PROGRAM  [place] [phone] Patients are seen by appointment only. Walk-ins are not accepted. [place] will see patients 37 years of age and older. Monday - Tuesday (  8am-5pm) Most Wednesdays (8:30-5pm) $30 per visit, cash only  [facility] Adult Dental Access PROGRAM  [address], [facility] [phone] Patients are seen by appointment only. Walk-ins are not accepted. [place] will see patients 50 years of age and older. One Wednesday Evening (Monthly: Volunteer Based).  $30 per visit, cash only  [place]  [phone] for adults; Children under age 69, call Graduate Pediatric Dentistry at [phone]. Children aged 63-14, please call [phone] to request a pediatric application.  Dental services are provided in all areas of dental care including fillings, crowns and bridges, complete and partial dentures, implants, gum treatment, root canals, and extractions. Preventive care is also provided. Treatment is provided to both adults and children. Patients are selected via a lottery and there is often a waiting list.   [facility] [address], [place]  [phone] [email or web address]   [facility] [address] [place], [place] [phone], Ext. [phone] Second and Fourth Thursday of each month, opens at 6:30 AM; Clinic ends at 9 AM.  Patients are seen on a first-come first-served basis, and a limited number are seen during each clinic.   [facility]  [address] Hillard Danker [place], [place] [phone]   Eligibility Requirements You must have lived in [place], [place], or [place] counties for at least the last three months.   You cannot be  eligible for state or federal sponsored [employer], including [employer], [place], or [employer].   You generally cannot be eligible for healthcare insurance through your employer.    How to apply: Eligibility screenings are held every Tuesday and Wednesday afternoon from 1:00 pm until 4:00 pm. You do not need an appointment for the interview!  [facility] [address], [place], [place]   [place]  [place] Department  [place]  [phone]    Behavioral Health Resources in the Community: Intensive Outpatient Programs Organization         Address  Phone  Notes  [place]. [address] [place], [place] [phone]   [facility] [address] [place], [place]   ADS: Alcohol & Drug Svcs [address], [place], [place]   [place] [address],  [place], [place] or [phone]   Substance Abuse Resources Organization         Address  Phone  Notes  Alcohol and Drug Services  [phone]   [place]  [phone]   The [place]   Chinita Pester  [phone]   Residential & Outpatient Substance Abuse Program  [phone]   Psychological Services Organization         Address  Phone  Notes  [facility] [place]  [place]  [phone]   [place] [address] [place] or [phone]    Mobile Crisis Teams Organization         Address  Phone  Notes  [facility]rapeutic Alternatives, Mobile Crisis Care Unit  [phone]   Assertive Psychotherapeutic Services  [address] [place], [place]   [place] [address] [place] [phone]    Self-Help/Support Groups Organization          Address  Phone  Notes  Mental Health Assoc. of [place] - variety of support groups  [place] Call for more information  Narcotics Anonymous (NA), [facility] [address], [employer] [place]  2 meetings at this location   Special educational needs teacher         Address  Phone  Notes  [facility] Residential Treatment [place],    [place]  [phone]   [facility]  [address], [place] [address], [place], [place]   [place], [place] [phone] Admissions: 8am-3pm M-F  Incentives Substance [place] [address]    [place], [place] [phone]   [facility] [address] [place], [place], [place]   [facility] [address]  [place], [place]   [facility] Programs - Intensive Outpatient [address], [place], [place]   [facility] [place].,  [place], [place] [phone] or [phone]   [facility] (RTS) [address], [place], [place] Accepts Medicaid  [facility] [place] [address]  [place] [phone] Substance Abuse/Addiction Treatment   [facility] Organization         Address  Phone  Notes  [facility]  [phone]   Domenic Schwab, PhD [address] Arlis Porta [place], [place]   [phone] or [phone]   [place], [place] [phone]   [facility] [address], [place], [place] [phone] Insurance/Medicaid/sponsorship through [facility] and Families [address], [address]                                    [place], [place] [phone] [place] [address], [place] [phone]    Dr. Adele Schilder  [phone]   [facility] [place] Dept. 1) [address] [place] 2) [place] 3)  [address], [place] [phone]  [phone]   [place] [phone] or [phone] (After Hours)

## 2013-09-30 ENCOUNTER — Emergency Department (INDEPENDENT_AMBULATORY_CARE_PROVIDER_SITE_OTHER)
Admission: EM | Admit: 2013-09-30 | Discharge: 2013-09-30 | Disposition: A | Discharge status: Home / Self Care | Payer: Self-pay | Source: Home / Self Care | Attending: Family Medicine | Admitting: Family Medicine

## 2013-09-30 ENCOUNTER — Encounter (HOSPITAL_COMMUNITY): Payer: Self-pay | Admitting: Emergency Medicine

## 2013-09-30 DIAGNOSIS — I10 Essential (primary) hypertension: Secondary | ICD-10-CM

## 2013-09-30 MED ORDER — AMLODIPINE BESYLATE 10 MG PO TABS: 10.0000 mg | ORAL_TABLET | Freq: Every day | ORAL | Status: DC

## 2013-09-30 MED ORDER — CARVEDILOL 25 MG PO TABS: 25.0000 mg | ORAL_TABLET | Freq: Two times a day (BID) | ORAL | Status: DC

## 2013-09-30 MED ORDER — CLOPIDOGREL BISULFATE 75 MG PO TABS: 75.0000 mg | ORAL_TABLET | Freq: Every day | ORAL | Status: DC

## 2013-09-30 MED ORDER — ENALAPRIL MALEATE 10 MG PO TABS: 10.0000 mg | ORAL_TABLET | Freq: Two times a day (BID) | ORAL | Status: DC

## 2013-09-30 NOTE — ED Provider Notes (Signed)
CSN: [id number]     Arrival date & time 09/30/13  1215 History   First MD Initiated Contact with Patient 09/30/13 1239     Chief Complaint  Patient presents with  . Medication Refill   (Consider location/radiation/quality/duration/timing/severity/associated sxs/prior Treatment) Patient is a 53 y.o. male presenting with general illness. The history is provided by the patient.  Illness Location:  Here for medicine refills Chronicity:  New Associated symptoms: no shortness of breath     Past Medical History  Diagnosis Date  . Hypertension   . Diabetes mellitus   . Asthma   . Hyperlipidemia   . Coronary artery disease     CABG 12/12 (LIMA-LAD, SVG-DX, SVG-OM, SVG-PDA) c/b post op AFib Rx with amio Rx. s/p successful PCI of the right coronary artery utilizing overlapping drug-eluting stents in the mid and distal vessel (done after abnormal nuc)  . Chest pain     01/2011 in setting of HTN urgency: Chest CT demonstrated no pulmonary embolus, postoperative appearance following CABG and small left pleural effusion and dependent atelectasis in the left lung  . Elevated hemoglobin    Past Surgical History  Procedure Laterality Date  . Cardiac catheterization    . Coronary artery bypass graft  12/30/2010    Procedure: CORONARY ARTERY BYPASS GRAFTING (CABG);  Surgeon: Grace Isaac, MD;  Location: [facility];  Service: Open Heart Surgery;  Laterality: N/A;   Family History  Problem Relation Age of Onset  . Anesthesia problems Neg Hx   . Hypotension Neg Hx   . Malignant hyperthermia Neg Hx   . Pseudochol deficiency Neg Hx    History  Substance Use Topics  . Smoking status: Current Some Day Smoker    Last Attempt to Quit: 10/21/2010  . Smokeless tobacco: Never Used  . Alcohol Use: No    Review of Systems  Constitutional: Negative.   Respiratory: Negative for chest tightness and shortness of breath.   Cardiovascular: Negative for leg swelling.    Allergies  Review of patient's  allergies indicates no known allergies.  Home Medications   Prior to Admission medications   Medication Sig Start Date End Date Taking? Authorizing Provider  amLODipine (NORVASC) 10 MG tablet Take 1 tablet (10 mg total) by mouth daily. 06/21/13   Sherren Mocha, MD  amLODipine (NORVASC) 10 MG tablet Take 1 tablet (10 mg total) by mouth daily. 09/30/13   Billy Fischer, MD  amLODipine (NORVASC) 10 MG tablet Take 1 tablet (10 mg total) by mouth daily. 09/30/13   Billy Fischer, MD  aspirin 81 MG tablet Take 81 mg by mouth daily. 03/08/11   Dayna N Dunn, PA-C  carvedilol (COREG) 25 MG tablet Take 1 tablet (25 mg total) by mouth 2 (two) times daily. 06/21/13   Sherren Mocha, MD  carvedilol (COREG) 25 MG tablet Take 1 tablet (25 mg total) by mouth 2 (two) times daily with a meal. 09/30/13   Billy Fischer, MD  clopidogrel (PLAVIX) 75 MG tablet Take 1 tablet (75 mg total) by mouth daily. 06/21/13   Sherren Mocha, MD  clopidogrel (PLAVIX) 75 MG tablet Take 1 tablet (75 mg total) by mouth daily. 09/30/13   Billy Fischer, MD  clopidogrel (PLAVIX) 75 MG tablet Take 1 tablet (75 mg total) by mouth daily. 09/30/13   Billy Fischer, MD  enalapril (VASOTEC) 10 MG tablet Take 1 tablet (10 mg total) by mouth 2 (two) times daily. 06/21/13   Sherren Mocha, MD  enalapril ([place])  10 MG tablet Take 1 tablet (10 mg total) by mouth 2 (two) times daily. 09/30/13   Billy Fischer, MD  hydrochlorothiazide (HYDRODIURIL) 25 MG tablet Take 1 tablet (25 mg total) by mouth daily. 06/21/13 06/21/14  Sherren Mocha, MD  HYDROcodone-acetaminophen (NORCO/VICODIN) 5-325 MG per tablet Take 1 tablet by mouth 2 (two) times daily as needed for severe pain. 09/27/13   Everlene Balls, MD  metFORMIN (GLUCOPHAGE) 1000 MG tablet Take 1 tablet (1,000 mg total) by mouth daily after lunch. 06/21/13   Sherren Mocha, MD  nitroGLYCERIN (NITROSTAT) 0.4 MG SL tablet Place 1 tablet (0.4 mg total) under the tongue every 5 (five) minutes as needed. For chest pain 06/21/13  06/21/14  Sherren Mocha, MD  potassium chloride SA (K-DUR,KLOR-CON) 20 MEQ tablet Take 1 tablet (20 mEq total) by mouth as needed. 06/21/13 06/21/14  Sherren Mocha, MD  pravastatin (PRAVACHOL) 40 MG tablet Take 1 tablet (40 mg total) by mouth every evening. 06/21/13 06/21/14  Sherren Mocha, MD   BP 132/82  Pulse 82  Temp(Src) 98.3 F (36.8 C) (Oral)  Resp 16  SpO2 96% Physical Exam  Nursing note and vitals reviewed. Constitutional: He is oriented to person, place, and time. He appears well-developed and well-nourished.  Neck: Normal range of motion. Neck supple.  Cardiovascular: Regular rhythm and normal heart sounds.   Pulmonary/Chest: Breath sounds normal.  Musculoskeletal: He exhibits no edema.  Neurological: He is alert and oriented to person, place, and time.  Skin: Skin is warm and dry.    ED Course  Procedures (including critical care time) Labs Review Labs Reviewed - No data to display  Imaging Review No results found.   MDM   1. Essential hypertension        Billy Fischer, MD 09/30/13 1324

## 2013-09-30 NOTE — ED Notes (Signed)
Needs refill of his medications

## 2013-09-30 NOTE — Discharge Instructions (Signed)
See dr Jonelle Sidle for further refills.

## 2013-10-05 IMAGING — CR DG CHEST 2V
2 series · 2 of 2 positions shown · non-contrast
Comparison: [DATE]

CLINICAL DATA: Short of breath.  Left-sided chest pain.

CHEST - 2 VIEW

[view not recorded (1 of 2)]
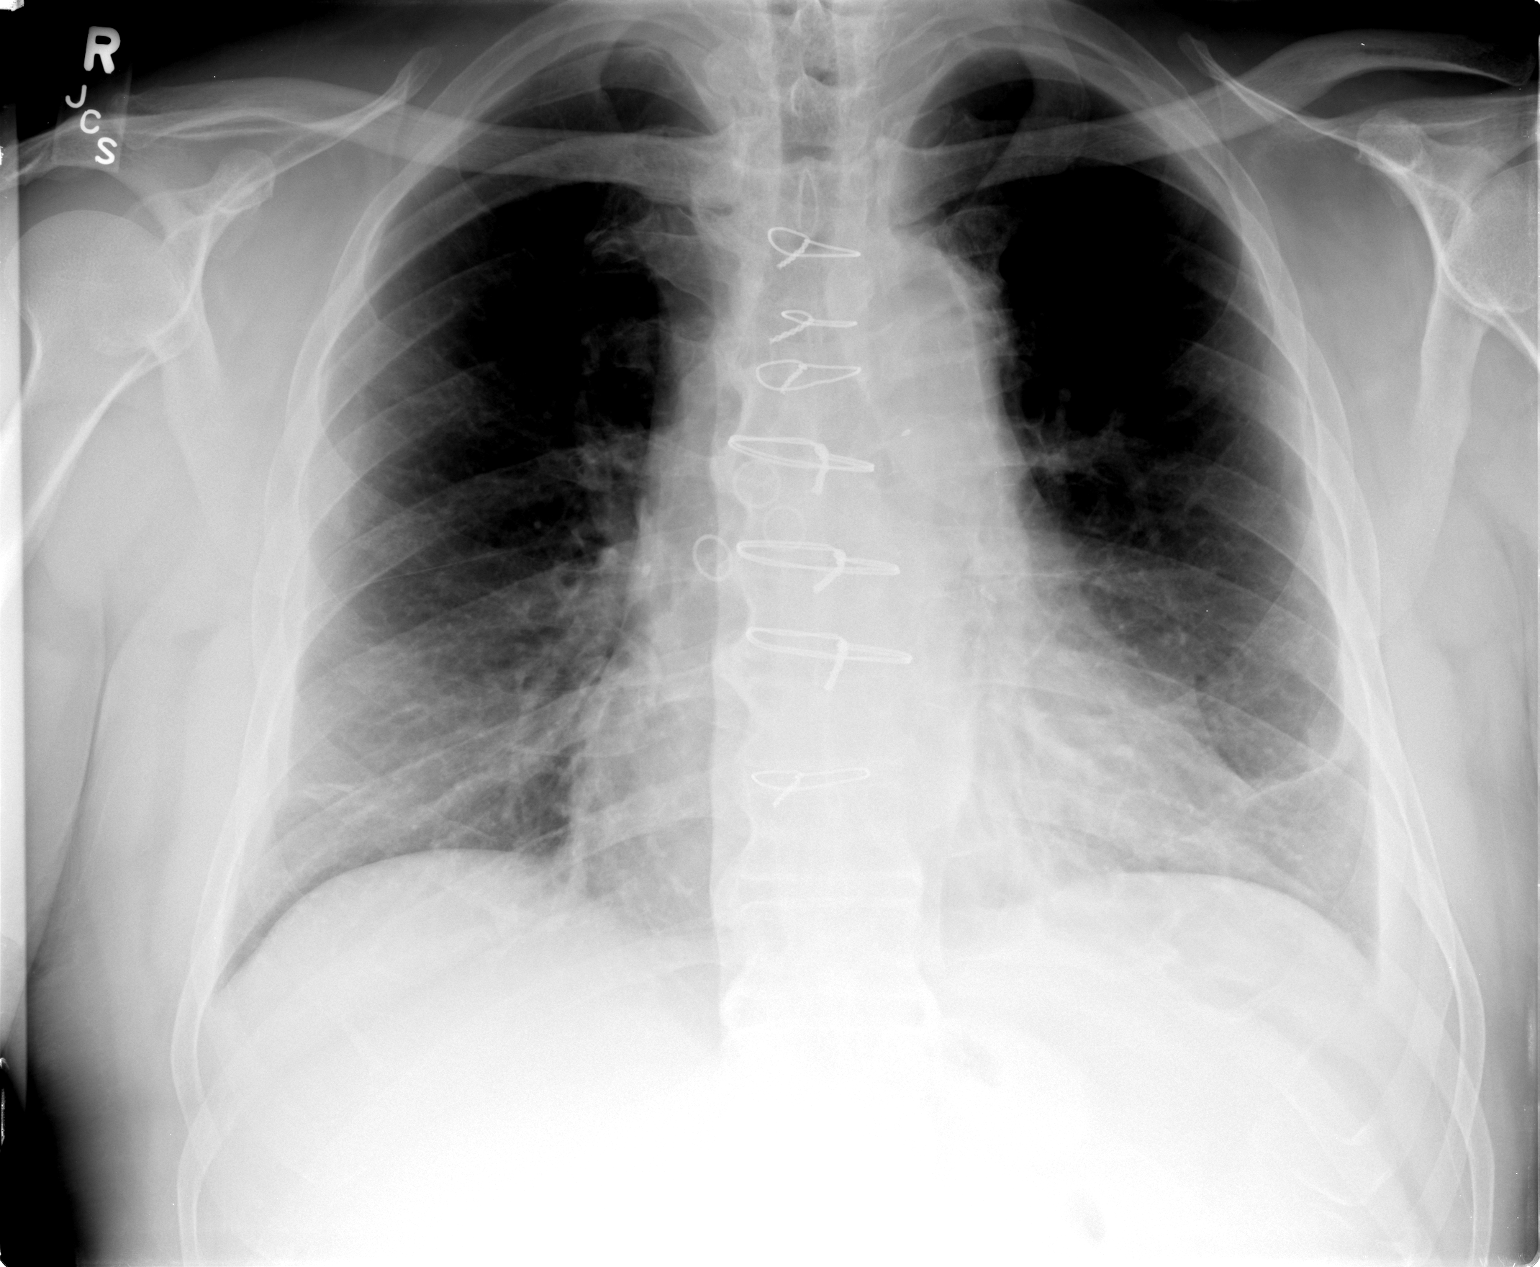

[view not recorded (2 of 2)]
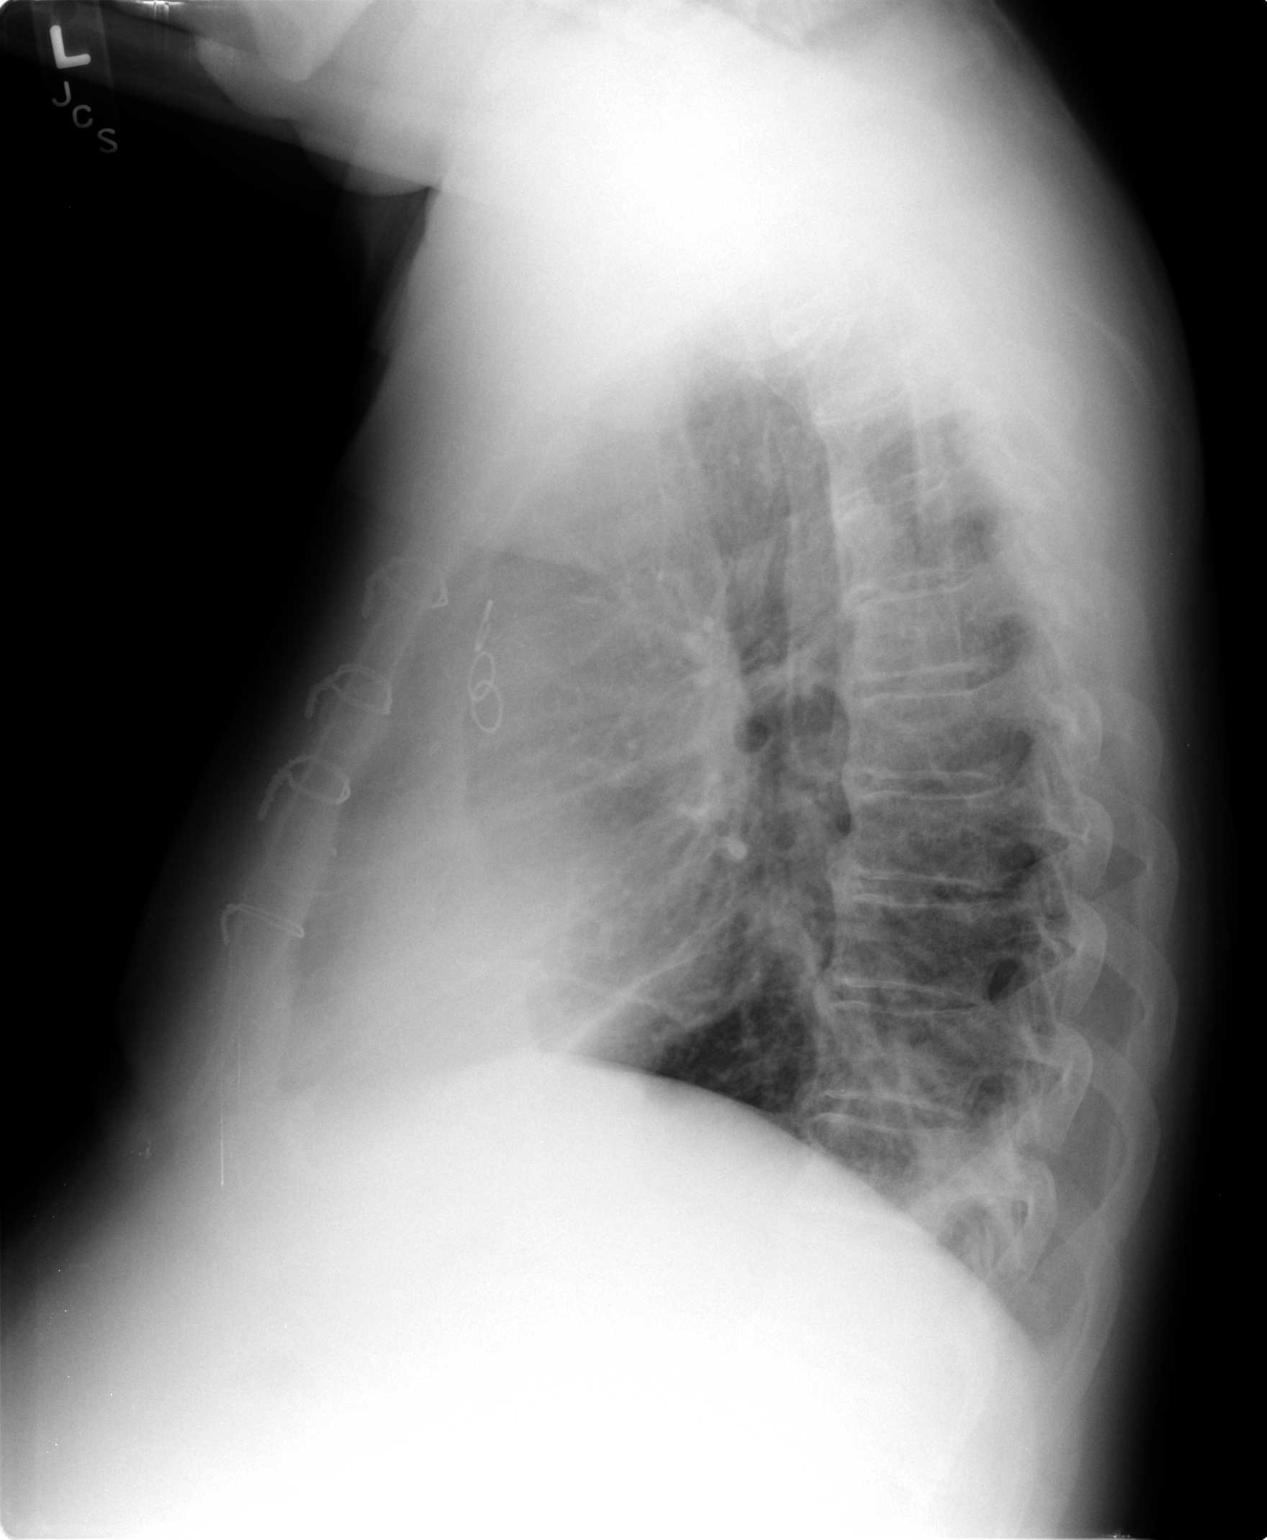

[2 of 2 positions shown; findings below may reference images not displayed]

FINDINGS: There has been previous median sternotomy and CABG.
There has been resolution of new congestive heart failure pattern
with previously seen edema, effusions and basilar atelectasis.
There is only mild residual density at the lung bases most
consistent with mild residual atelectasis.  Upper lungs are clear.
No pulmonary edema.
IMPRESSION: Marked improvement in congestive heart failure pattern.  Resolution
of edema and effusions.  Only mild residual volume loss at the lung
bases.

## 2013-10-19 IMAGING — CR DG CHEST 2V
2 series · 2 of 2 positions shown · non-contrast
Comparison: 01/20/2011.

CLINICAL DATA: Recent CABG.  Follow-up.

CHEST - 2 VIEW

[w chest pa]
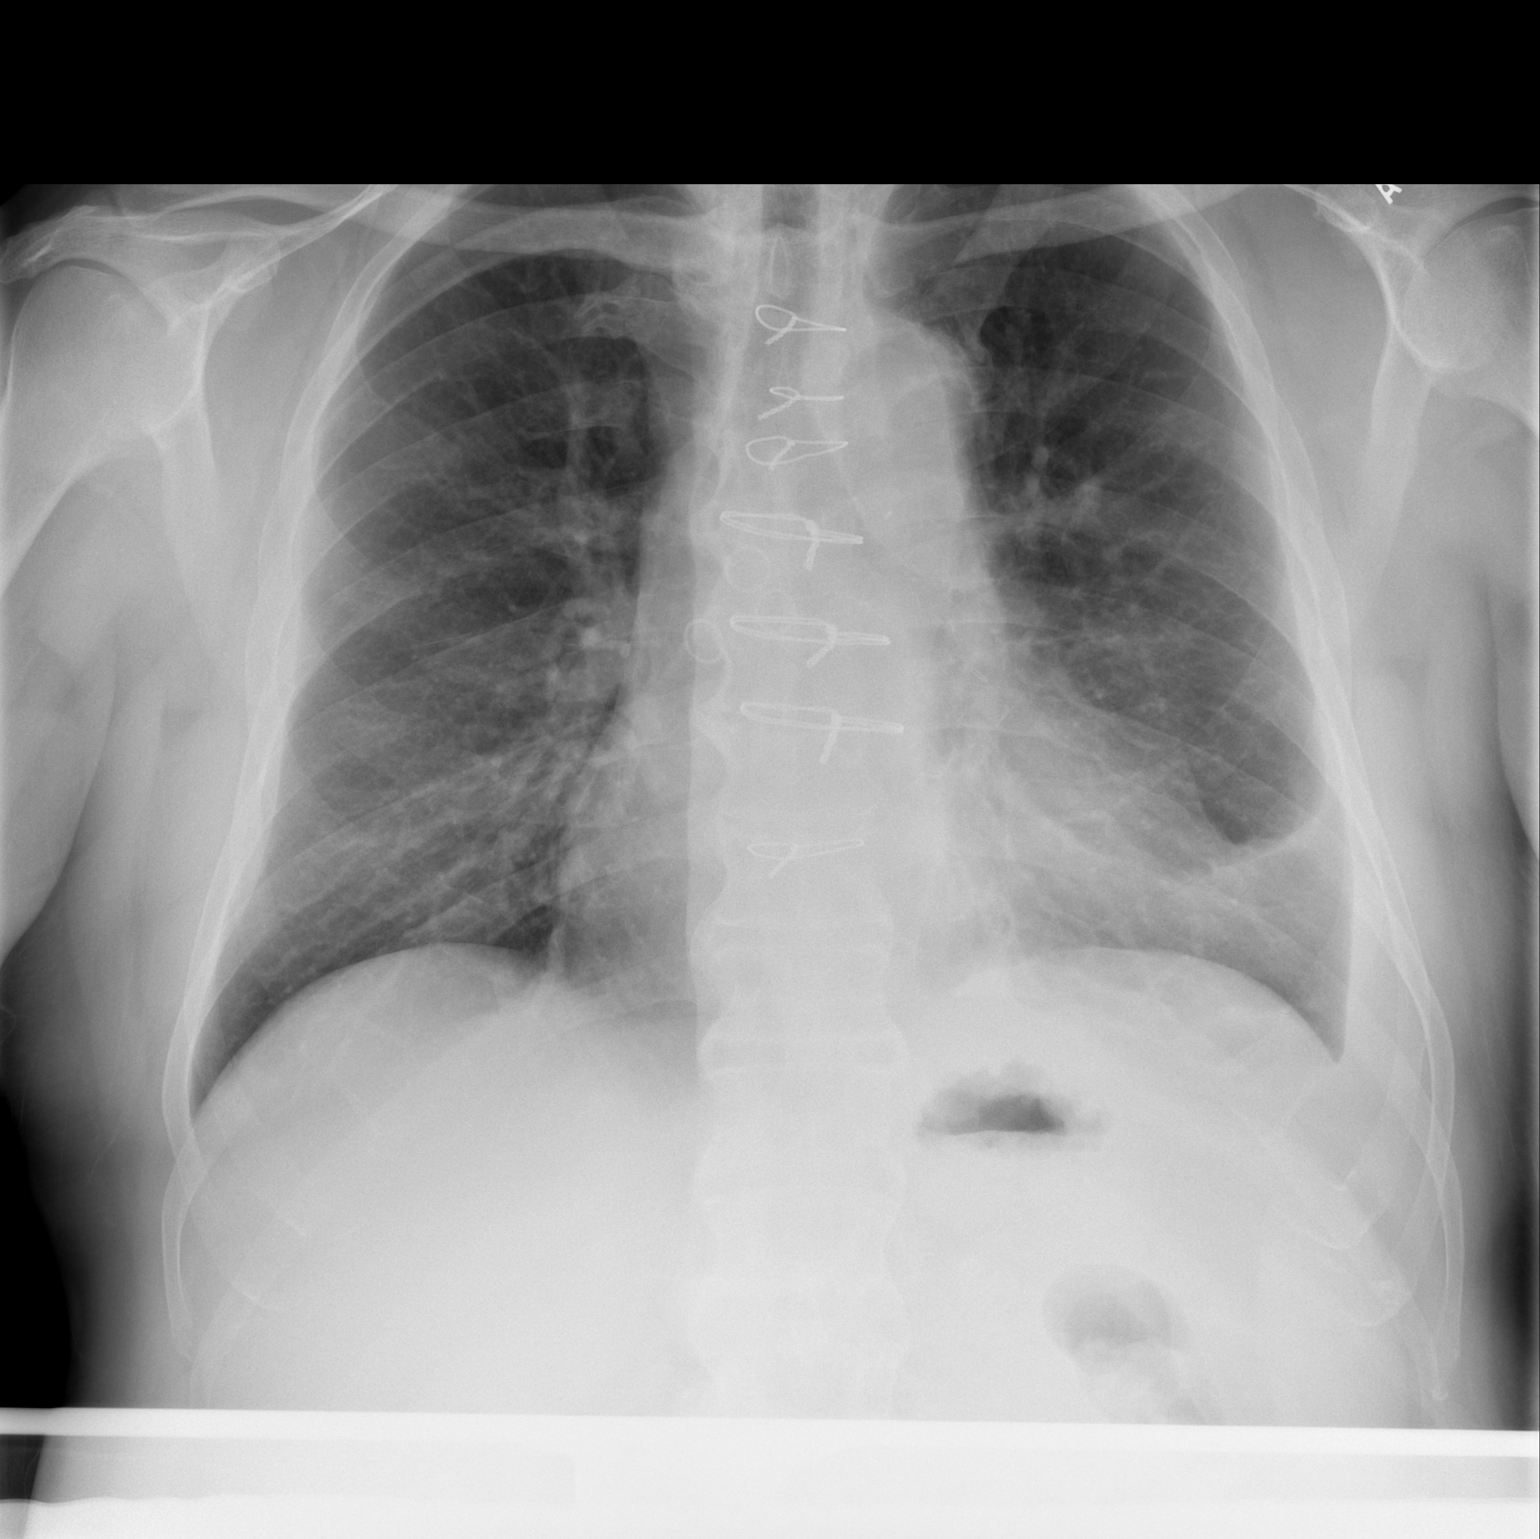

[w chest lat]
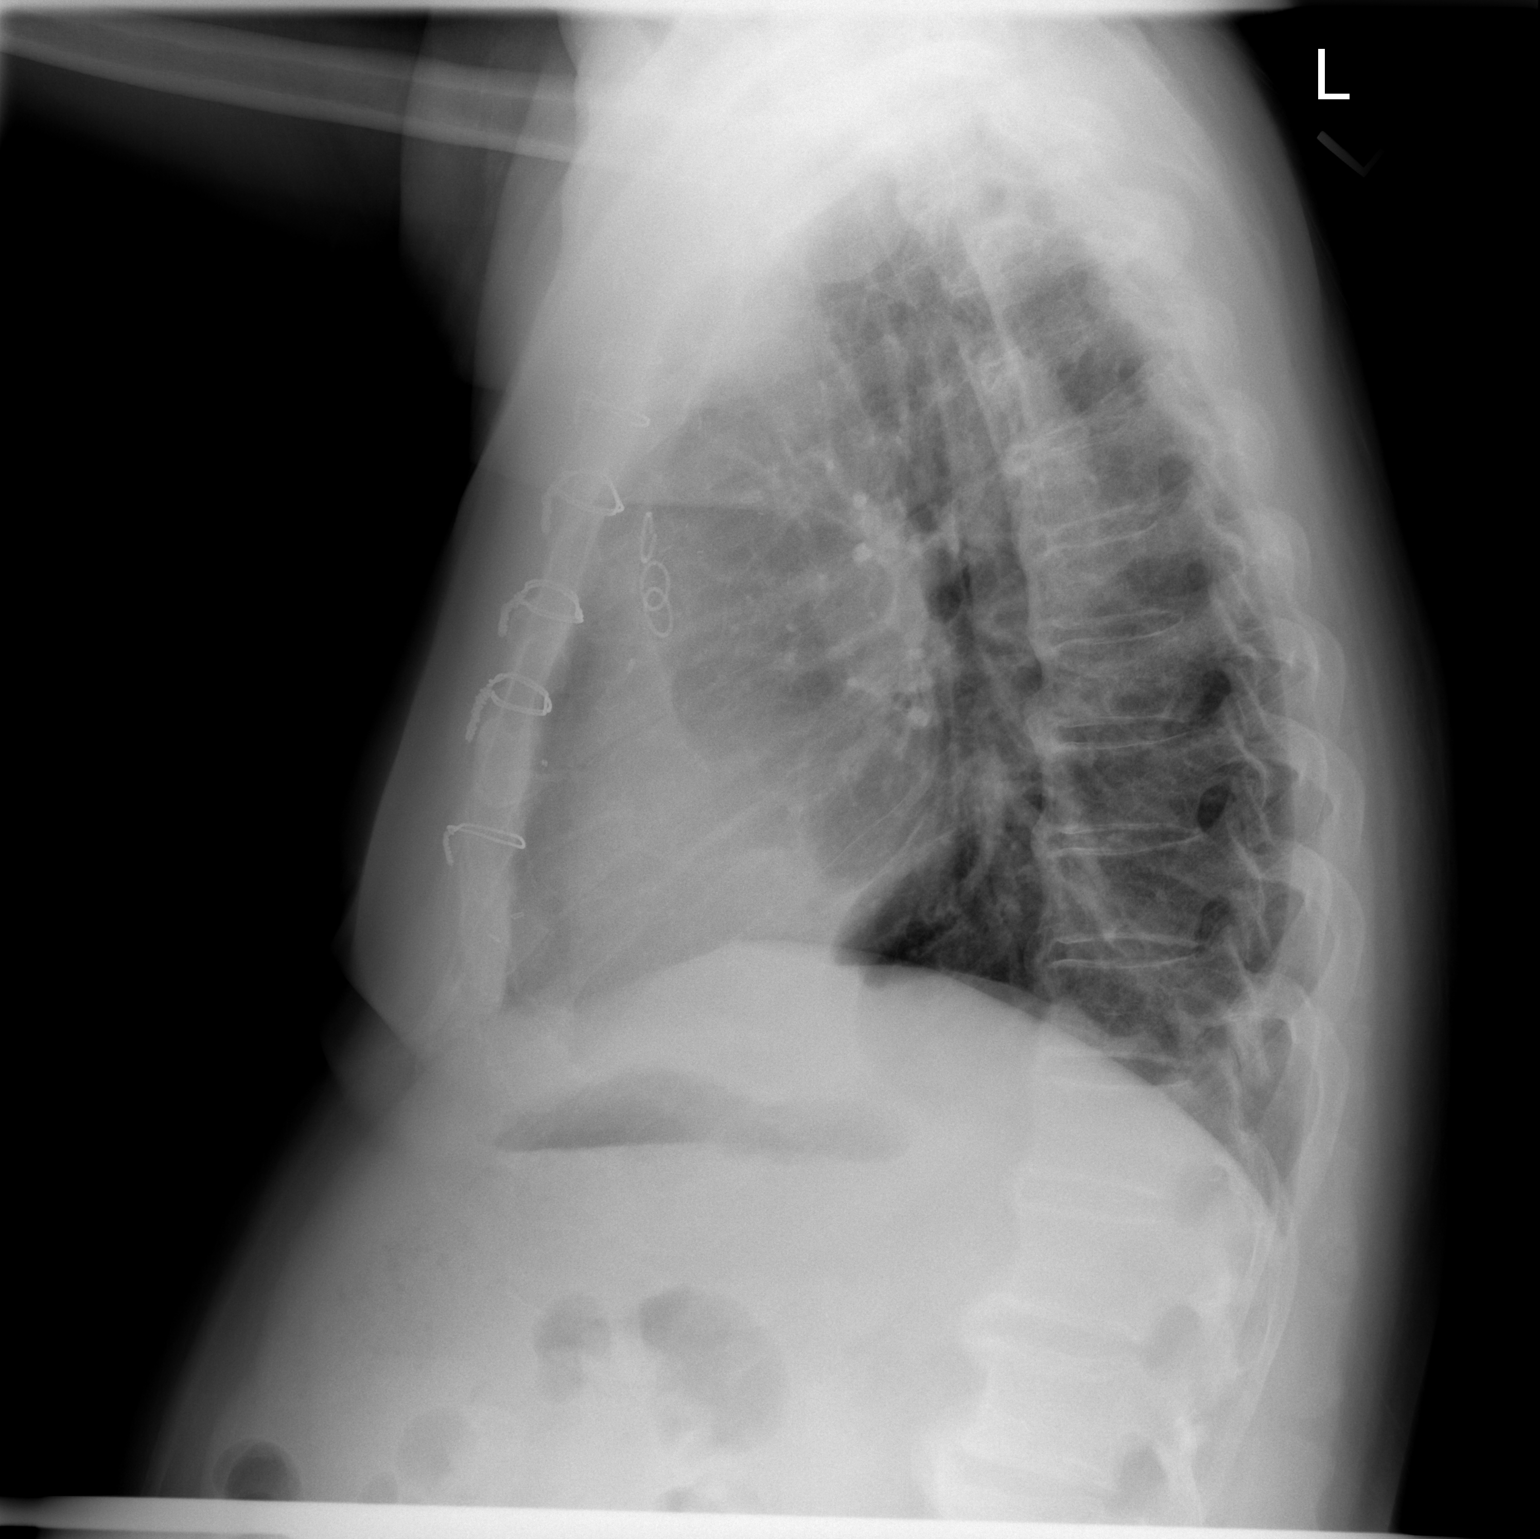

[2 of 2 positions shown; findings below may reference images not displayed]

FINDINGS: Trachea is midline.  Heart is at the upper limits of
normal in size, stable.  Small left pleural effusion and left
basilar volume loss are stable.  Right lung is clear.  No
pneumothorax.
IMPRESSION: Small left pleural effusion and mild left basilar volume loss are
stable.

## 2013-10-24 ENCOUNTER — Ambulatory Visit: Payer: Medicaid Other

## 2013-10-24 ENCOUNTER — Other Ambulatory Visit: Payer: Medicaid Other

## 2013-11-04 ENCOUNTER — Encounter: Payer: Self-pay | Admitting: Internal Medicine

## 2013-11-04 NOTE — Progress Notes (Signed)
Patient came in wanting last 3 labs when he was here with Dr. Juliann Mule. Robin pulled the labs and I faxed to [phone]. I called and left message on vmail at cell ph for patient to let him know I faxed them for him.

## 2013-11-16 IMAGING — CR DG CHEST 1V PORT
1 series · 1 of 1 positions shown · non-contrast
Comparison: Two-view chest 02/17/2011.

CLINICAL DATA: Chest pain.

PORTABLE CHEST - 1 VIEW

[AP]
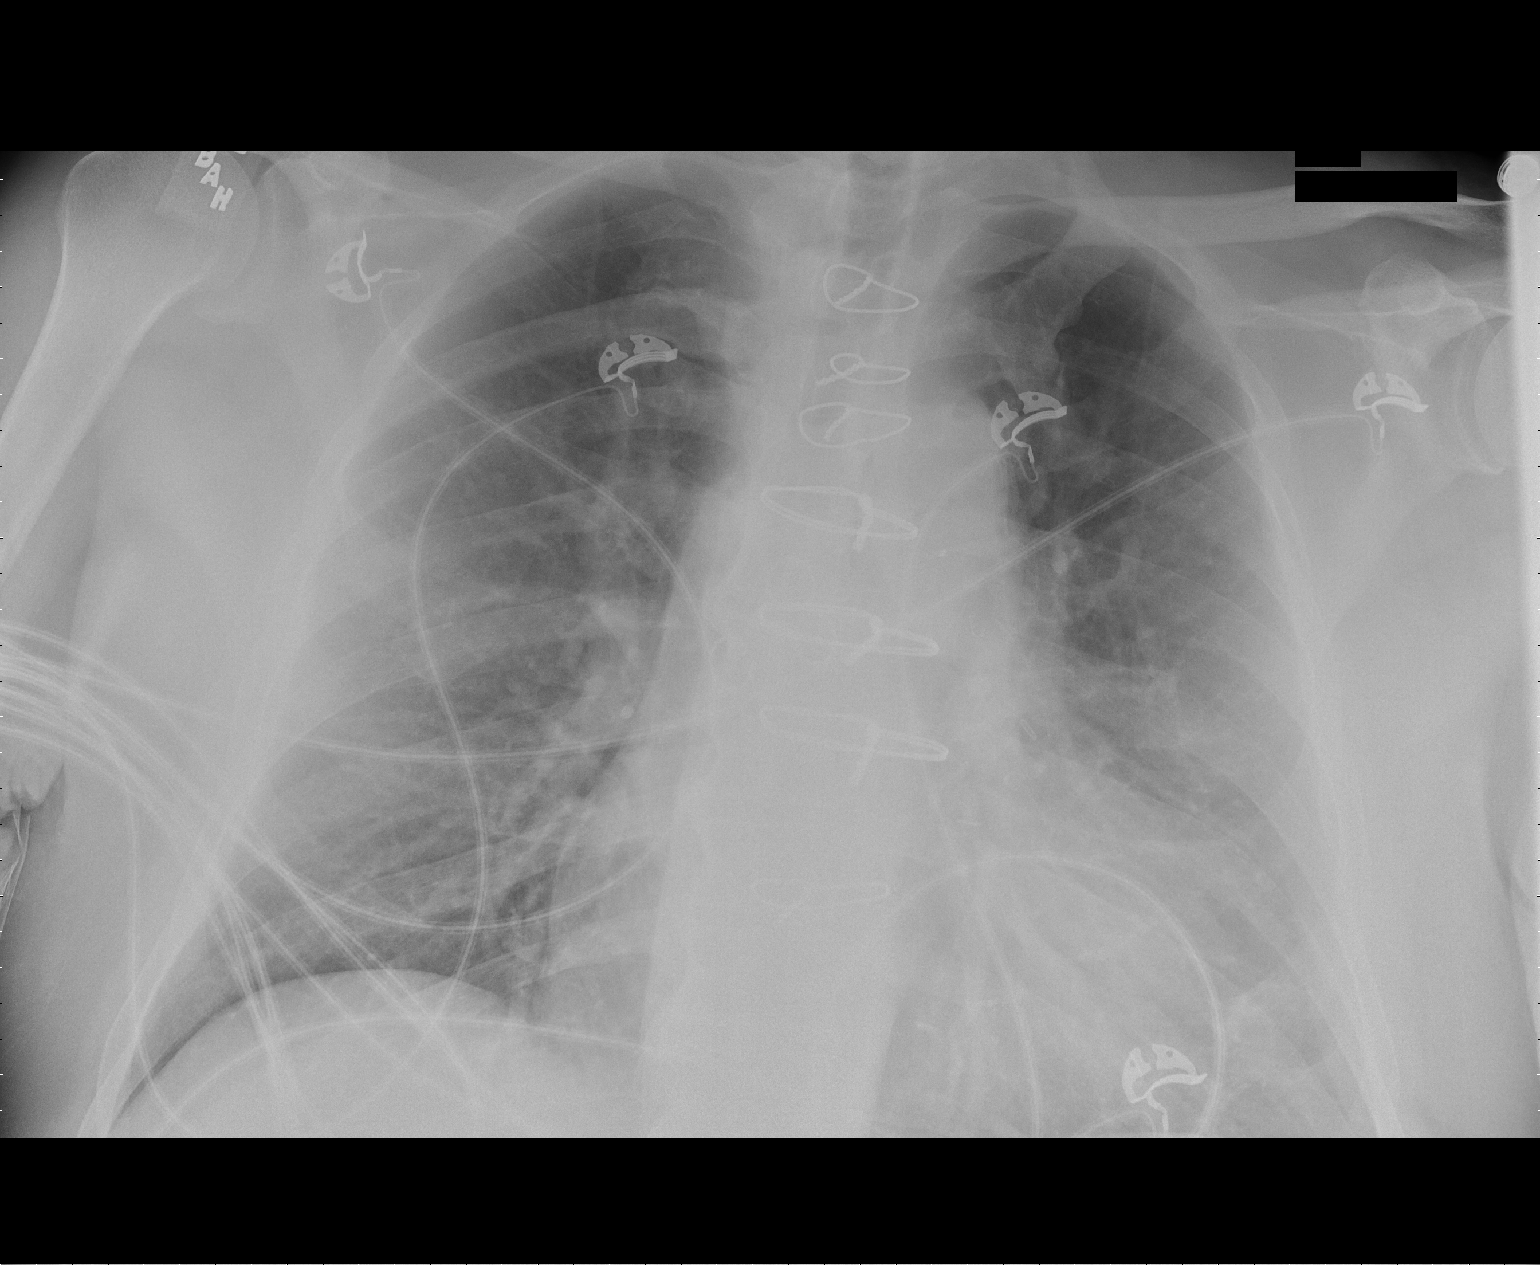

[1 of 1 positions shown; findings below may reference images not displayed]

FINDINGS: The heart is enlarged.  No edema or effusions are present
to suggest failure.  The visualized soft tissues and bony thorax
are unremarkable.  The lungs are clear.
IMPRESSION: Stable cardiomegaly without failure.

## 2013-12-29 ENCOUNTER — Encounter (HOSPITAL_COMMUNITY): Payer: Self-pay | Admitting: Cardiovascular Disease

## 2014-01-16 ENCOUNTER — Telehealth: Payer: Self-pay | Admitting: Hematology

## 2014-01-16 NOTE — Telephone Encounter (Signed)
Pt came into sch and needed appt to see MD & labs. Pt is sch w/ YF and will go from there on getting lab orders complete. Pt confirm.

## 2014-01-18 ENCOUNTER — Telehealth: Payer: Self-pay | Admitting: *Deleted

## 2014-01-18 ENCOUNTER — Telehealth: Payer: Self-pay | Admitting: Hematology

## 2014-01-18 ENCOUNTER — Ambulatory Visit (HOSPITAL_BASED_OUTPATIENT_CLINIC_OR_DEPARTMENT_OTHER): Payer: Medicaid Other

## 2014-01-18 ENCOUNTER — Other Ambulatory Visit: Payer: Self-pay | Admitting: Cardiovascular Disease

## 2014-01-18 ENCOUNTER — Ambulatory Visit (HOSPITAL_BASED_OUTPATIENT_CLINIC_OR_DEPARTMENT_OTHER): Payer: Medicaid Other | Admitting: Hematology

## 2014-01-18 VITALS — BP 126/73 | HR 87 | Temp 98.3°F | Resp 19 | Ht 67.25 in | Wt 209.9 lb

## 2014-01-18 DIAGNOSIS — D751 Secondary polycythemia: Secondary | ICD-10-CM

## 2014-01-18 LAB — CBC WITH DIFFERENTIAL/PLATELET
BASO%: 0.5 % (ref 0.0–2.0)
BASOS ABS: 0 10*3/uL (ref 0.0–0.1)
EOS ABS: 0.3 10*3/uL (ref 0.0–0.5)
EOS%: 6 % (ref 0.0–7.0)
HCT: 54.5 % — ABNORMAL HIGH (ref 38.4–49.9)
HEMOGLOBIN: 18.5 g/dL — AB (ref 13.0–17.1)
LYMPH%: 33.9 % (ref 14.0–49.0)
MCH: 29.7 pg (ref 27.2–33.4)
MCHC: 33.9 g/dL (ref 32.0–36.0)
MCV: 87.5 fL (ref 79.3–98.0)
MONO#: 0.4 10*3/uL (ref 0.1–0.9)
MONO%: 7.4 % (ref 0.0–14.0)
NEUT%: 52.2 % (ref 39.0–75.0)
NEUTROS ABS: 3 10*3/uL (ref 1.5–6.5)
PLATELETS: 168 10*3/uL (ref 140–400)
RBC: 6.23 10*6/uL — ABNORMAL HIGH (ref 4.20–5.82)
RDW: 15.5 % — ABNORMAL HIGH (ref 11.0–14.6)
WBC: 5.7 10*3/uL (ref 4.0–10.3)
lymph#: 1.9 10*3/uL (ref 0.9–3.3)

## 2014-01-18 LAB — COMPREHENSIVE METABOLIC PANEL (CC13)
ALT: 35 U/L (ref 0–55)
ANION GAP: 8 meq/L (ref 3–11)
AST: 19 U/L (ref 5–34)
Albumin: 4.1 g/dL (ref 3.5–5.0)
Alkaline Phosphatase: 69 U/L (ref 40–150)
BUN: 16.1 mg/dL (ref 7.0–26.0)
CALCIUM: 9.5 mg/dL (ref 8.4–10.4)
CHLORIDE: 102 meq/L (ref 98–109)
CO2: 29 mEq/L (ref 22–29)
CREATININE: 1 mg/dL (ref 0.7–1.3)
EGFR: 83 mL/min/{1.73_m2} — ABNORMAL LOW (ref >90)
Glucose: 144 mg/dl — ABNORMAL HIGH (ref 70–140)
Potassium: 4 mEq/L (ref 3.5–5.1)
Sodium: 139 mEq/L (ref 136–145)
TOTAL PROTEIN: 7.4 g/dL (ref 6.4–8.3)
Total Bilirubin: 1.47 mg/dL — ABNORMAL HIGH (ref 0.20–1.20)

## 2014-01-18 MED ORDER — PRAVASTATIN SODIUM 40 MG PO TABS: 40.0000 mg | ORAL_TABLET | Freq: Every evening | ORAL | Status: DC

## 2014-01-18 NOTE — Telephone Encounter (Signed)
Per 12/30 POF sent msg to add Phlebotomy.Keith KitchenMarland KitchenMarland Flores

## 2014-01-18 NOTE — Telephone Encounter (Signed)
Pt confirmed labs/ov per 12/30 POF, gave pt AVS.... KJ °

## 2014-01-18 NOTE — Patient Instructions (Signed)

## 2014-01-18 NOTE — Telephone Encounter (Signed)
Per staff message and POF I have scheduled appts. Advised scheduler of appts. JMW  

## 2014-01-18 NOTE — Progress Notes (Signed)
Keith Flores presents today for phlebotomy per MD orders. Phlebotomy procedure started at 1510 and ended at 1525. 500 grams removed. Patient observed for 30 minutes after procedure without any incident. Patient tolerated procedure well. IV needle removed intact.

## 2014-01-18 NOTE — Progress Notes (Signed)
[place], [place] [address]  DIAGNOSIS: Erythrocytosis - Plan: CBC with Differential, Comprehensive metabolic panel (Cmet) - [facility], CT Biopsy, Surgical pathology  Chief Complaint  Patient presents with  . Follow-up    CURRENT THERAPY: Phlebotomy to maintain hct less than 45%.    INTERVAL HISTORY: Keith Flores 53 y.o. male  who is here for further follow up for erythrocytosis.  He was last seen by Dr. Juliann Mule 6 month ago, and has not been followed due to his travel. Of note, he has an extensive cardiac history including CAD s/p CABG in 2010, Hypertension and hyperlipidemia for which he is being managed by cardiology. He had a lexiscan myoview recently due to test discomfort which was revealed the following: Low risk stress nuclear study with a moderate size, medium intensity, partially reversible inferior defect consistent with inferior thinning vs prior infarct and mild inferior ischemia. LV Ejection Fraction: 52%. LV Wall Motion: NL LV Function; NL Wall Motion. He was last seen by Dr. Sherren Mocha 12/2012.   He denies being told he had an elevated hemoglobin on prior CBCs. Looking through his lab values, he has had significant fluctuations with his hct being 53.6 to as low as 39 on 12/30/2010 to as high as 57.7 on 01/10/2013. He also denied feeling very itchy after taking a shower or bathing. He denied burning pain in his feet or cyanosis or discolorations. He also denied chest and abdominal pain, myalgia and weakness, fatigue, headache, blurred vision, transient loss of vision, paresthesias, slow mentation. He denied a history of blood clots or gout. He smoked about 6 cigarettes daily and smoked for many years prior. He denied any dysuria or hematuria or weight changes. He denied living in high altitudes or the use of testosterone.   Today, he is accompanied by an interpreter.  He is feeling well overall.  He has chronic low back pain, no other new complains.    MEDICAL HISTORY: Past Medical History  Diagnosis Date  . Hypertension   . Diabetes mellitus   . Asthma   . Hyperlipidemia   . Coronary artery disease     CABG 12/12 (LIMA-LAD, SVG-DX, SVG-OM, SVG-PDA) c/b post op AFib Rx with amio Rx. s/p successful PCI of the right coronary artery utilizing overlapping drug-eluting stents in the mid and distal vessel (done after abnormal nuc)  . Chest pain     01/2011 in setting of HTN urgency: Chest CT demonstrated no pulmonary embolus, postoperative appearance following CABG and small left pleural effusion and dependent atelectasis in the left lung  . Elevated hemoglobin      ALLERGIES:  has No Known Allergies.  MEDICATIONS: has a current medication list which includes the following prescription(s): aspirin, carvedilol, clopidogrel, enalapril, metformin, amlodipine, nitroglycerin, potassium chloride sa, and pravastatin.  SURGICAL HISTORY:  Past Surgical History  Procedure Laterality Date  . Cardiac catheterization    . Coronary artery bypass graft  12/30/2010    Procedure: CORONARY ARTERY BYPASS GRAFTING (CABG);  Surgeon: Grace Isaac, MD;  Location: [place];  Service: Open Heart Surgery;  Laterality: N/A;  . Left heart catheterization with coronary/graft angiogram  03/07/2011    Procedure: LEFT HEART CATHETERIZATION WITH Beatrix Fetters;  Surgeon: Sherren Mocha, MD;  Location: [facility] C[facility] LAB;  Service: Cardiovascular;;  . Percutaneous coronary stent intervention (pci-s)  03/07/2011    Procedure: PERCUTANEOUS CORONARY STENT INTERVENTION (PCI-S);  Surgeon: Sherren Mocha, MD;  Location: [place] CATH LAB;  Service: Cardiovascular;;  . Left heart catheterization with coronary angiogram N/A 01/12/2013    Procedure: LEFT HEART CATHETERIZATION WITH CORONARY ANGIOGRAM;  Surgeon: Blane Ohara, MD;  Location: [facility] CATH LAB;  Service: Cardiovascular;  Laterality: N/A;    REVIEW OF SYSTEMS:    Constitutional: Denies fevers, chills or abnormal weight loss Eyes: Denies blurriness of vision Ears, nose, mouth, throat, and face: Denies mucositis or sore throat Respiratory: Denies cough, dyspnea or wheezes Cardiovascular: Denies palpitation, chest discomfort or lower extremity swelling Gastrointestinal:  Denies nausea, heartburn or change in bowel habits Skin: Denies abnormal skin rashes Lymphatics: Denies new lymphadenopathy or easy bruising Neurological:Denies numbness, tingling or new weaknesses Behavioral/Psych: Mood is stable, no new changes  All other systems were reviewed with the patient and are negative.  PHYSICAL EXAMINATION: ECOG PERFORMANCE STATUS: 0 - Asymptomatic  Blood pressure 126/73, pulse 87, temperature 98.3 F (36.8 C), temperature source Oral, resp. rate 19, height 5' 7.25" (1.708 m), weight 209 lb 14.4 oz (95.21 kg), SpO2 100 %.  GENERAL:alert, no distress and comfortable; moderately obese SKIN: skin color, texture, turgor are normal, no rashes or significant lesions EYES: normal, Conjunctiva are pink and non-injected, sclera clear OROPHARYNX:no exudate, no erythema and lips, buccal mucosa, and tongue normal  NECK: supple, thyroid normal size, non-tender, without nodularity LYMPH:  no palpable lymphadenopathy in the cervical, axillary or supraclavicular LUNGS: clear to auscultation and percussion with normal breathing effort HEART: regular rate & rhythm and no murmurs and no lower extremity edema ABDOMEN:abdomen soft, non-tender and normal bowel sounds Musculoskeletal:no cyanosis of digits and no clubbing  NEURO: alert & oriented x 3 with fluent speech, no focal motor/sensory deficits  LABORATORY DATA: CBC Latest Ref Rng 01/18/2014 05/23/2013 05/09/2013  WBC 4.0 - 10.3 10e3/uL 5.7 6.1 6.7  Hemoglobin 13.0 - 17.1 g/dL 18.5(H) 15.1 16.0  Hematocrit 38.4 - 49.9 % 54.5(H) 46.8 49.5  Platelets 140 - 400 10e3/uL 168 213 232    CMP Latest Ref Rng 01/18/2014  05/23/2013 04/25/2013  Glucose 70 - 140 mg/dl 144(H) 228(H) 240(H)  BUN 7.0 - 26.0 mg/dL 16.1 17.3 14.9  Creatinine 0.7 - 1.3 mg/dL 1.0 1.1 1.0  Sodium 136 - 145 mEq/L 139 141 141  Potassium 3.5 - 5.1 mEq/L 4.0 4.2 3.8  Chloride 96 - 112 mEq/L - - -  CO2 22 - 29 mEq/L _0 Calcium 8.4 - 10.4 mg/dL 9.5 9.3 9.6  Total Protein 6.4 - 8.3 g/dL 7.4 - -  Total Bilirubin 0.20 - 1.20 mg/dL 1.47(H) - -  Alkaline Phos 40 - 150 U/L 69 - -  AST 5 - 34 U/L 19 - -  ALT 0 - 55 U/L 35 - -   Erythropoietin  Status: Finalresult Visible to patient:  Not Released Nextappt: 02/01/2014 at 09:00 AM in [facility] Room) Dx:  Polycythemia, secondary         Ref Range 40yrago    Erythropoietin 2.6 - 18.5 mIU/mL 13.7         RADIOGRAPHIC STUDIES: 05/20/2013 CT ABDOMEN WITH CONTRAST TECHNIQUE:  Multidetector CT imaging of the abdomen was performed using the standard protocol following bolus administration of intravenous contrast. CONTRAST: 1064mOMNIPAQUE IOHEXOL 300 MG/ML SOLN  COMPARISON: 04/12/2006 FINDINGS: The lung bases appear clear. No pleural or pericardial effusion.  No focal liver abnormality identified. The gallbladder is normal. There is no biliary dilatation. The spleen is unremarkable measuring 10.2 cm in length. The adrenal glands are both  normal. The right kidney is normal. Cyst within the upper pole of the left kidney is identified measuring 1.2 cm. Normal caliber of the abdominal aorta. No aneurysm. There is no upper abdominal adenopathy identified. The stomach is normal. The small bowel loops are unremarkable. Normal appearance of the colon. Review of the visualized osseous structures is significant for mild spondylosis. IMPRESSION: 1. No acute findings identified within the upper abdomen. 2. No mass or adenopathy and no evidence for splenomegaly.    ASSESSMENT: Braydn Eilene Ghazi Flores 53 y.o. male with a history of Erythrocytosis - Plan: CBC with Differential,  Comprehensive metabolic panel (Cmet) - [facility], CT Biopsy, Surgical pathology   Lot Antonieta Pert is a 53 y.o. 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 He also has an elevated vitamin b12. CT of Abdomen (2008) was negative for splenomegaly. JAK2 was negative. Normal EPO level. He denies classic aquagenic pruritus, negative for erythromegalalia. Secondary causes due to [place], [place] and steroids/testerone are felt to be less likely.   PLAN:  --On initial consultation, his smear was reviewed and did not reveal abnormal white blood cells or blasts. Pulse oximetry and/or arterial oxygen saturation at rest and after brisk walk was 96%.  --He met 2 major PCV study group criteria including an arterial oxygen greater than or equal to 92% and hemoglobin greater than 18.5. In addition he meets one minor PCV criteria with vitamin B12 greater than 900. Other minor PCV criteria including elevation of plt greater than 400,000 or WBC greater than 12,000 he did not meet. Other criteria which includes a bone marrow biopsy and erythropoietin levels can also be used such as the revised WHO criteria. It requires elevated hbg as noted above plus a positive JAK2 and one of the following: findings on the bone marrow biopsy and a low EPO level. However, he has a normal EPO level (subnormal in nearly 80% of the cases) and does not have splenomegaly on physical exam (though limited due to obesity). His spleen size was normal by CT of abdomen and pelvis on 04/12/2006 as noted on scan on 05/20/2013. In addition 95 to percent of people with polycythemia vera positive JAK2. We sent out for JAK2 testing and found to be negative. 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 We will obtain a CT of Abdomen with contrast to rule out splenomegaly and RCC. It is also negative as noted on scan above.  -To confirm  his diagnosis, I recommend him to have a bone marrow biopsy. He agrees.  --Although his JAK2 is negative, we will perform phlebetomy with a goal less than 45% for the Hematocrit based on the CYTO-PV trial ([place] R, NEJM 2013). One unit phlebetomy (500 mL) should drop the hematocrit by 3%.  -Based on his age less than 53 and no history of thrombosis, he has low risk thrombotic risk, we will hold the addition of hydroxyurea daily as an adjunctive therapy. An overview of polycythemia was provided to him as handouts.  -- I reiterated the importance of compliance with his daily baby aspirin (81 mg) daily.   2. CAD -continue follow up with his cardiologist   3. HTN, DM, hyperlipidemia -follow up with PCP    PLAN: 1. Phlebotomy today and in 2 weeks #2 schedule interventional radiology #3 return to clinic in 1 month's, with repeated CBC, and review his bone marrow biopsy results.  All questions were  answered. The patient knows to call the clinic with any problems, questions or concerns. We can certainly see the patient much sooner if necessary.  I spent 15 minutes counseling the patient face to face in the presence of an arabic intepreter. The total time spent in the appointment was 25 minutes.    Truitt Merle, MD 01/19/2015 11:08 AM

## 2014-01-18 NOTE — Progress Notes (Signed)
Hct = 54; orders for phlebotomy if Hct > 45 Post vitals signs stable.

## 2014-01-20 ENCOUNTER — Encounter: Payer: Self-pay | Admitting: Hematology

## 2014-01-30 ENCOUNTER — Other Ambulatory Visit: Payer: Self-pay | Admitting: Radiology

## 2014-01-31 ENCOUNTER — Other Ambulatory Visit: Payer: Self-pay | Admitting: Radiology

## 2014-02-01 ENCOUNTER — Ambulatory Visit (HOSPITAL_COMMUNITY)
Admission: RE | Admit: 2014-02-01 | Discharge: 2014-02-01 | Disposition: A | Discharge status: Home / Self Care | Payer: Medicaid Other | Source: Ambulatory Visit | Attending: Hematology | Admitting: Hematology

## 2014-02-01 ENCOUNTER — Telehealth: Payer: Self-pay | Admitting: Hematology

## 2014-02-01 ENCOUNTER — Encounter (HOSPITAL_COMMUNITY): Payer: Self-pay

## 2014-02-01 ENCOUNTER — Other Ambulatory Visit: Payer: Self-pay | Admitting: *Deleted

## 2014-02-01 DIAGNOSIS — D751 Secondary polycythemia: Secondary | ICD-10-CM | POA: Diagnosis present

## 2014-02-01 LAB — BONE MARROW EXAM

## 2014-02-01 LAB — CBC
HEMATOCRIT: 54 % — AB (ref 39.0–52.0)
HEMOGLOBIN: 18.3 g/dL — AB (ref 13.0–17.0)
MCH: 30.1 pg (ref 26.0–34.0)
MCHC: 33.9 g/dL (ref 30.0–36.0)
MCV: 89 fL (ref 78.0–100.0)
Platelets: 194 10*3/uL (ref 150–400)
RBC: 6.07 MIL/uL — AB (ref 4.22–5.81)
RDW: 14.9 % (ref 11.5–15.5)
WBC: 6.7 10*3/uL (ref 4.0–10.5)

## 2014-02-01 LAB — PROTIME-INR
INR: 1.06 (ref 0.00–1.49)
Prothrombin Time: 13.9 seconds (ref 11.6–15.2)

## 2014-02-01 LAB — GLUCOSE, CAPILLARY: GLUCOSE-CAPILLARY: 139 mg/dL — AB (ref 70–99)

## 2014-02-01 LAB — APTT: APTT: 39 s — AB (ref 24–37)

## 2014-02-01 MED ORDER — MIDAZOLAM HCL 2 MG/2ML IJ SOLN
INTRAMUSCULAR | Status: AC
  Filled 2014-02-01: qty 6

## 2014-02-01 MED ORDER — MIDAZOLAM HCL 2 MG/2ML IJ SOLN
INTRAMUSCULAR | Status: AC | PRN
  Administered 2014-02-01 (×2): 1 mg via INTRAVENOUS

## 2014-02-01 MED ORDER — SODIUM CHLORIDE 0.9 % IV SOLN
INTRAVENOUS | Status: DC
  Administered 2014-02-01: 500 mL via INTRAVENOUS

## 2014-02-01 MED ORDER — FENTANYL CITRATE 0.05 MG/ML IJ SOLN
INTRAMUSCULAR | Status: AC | PRN
  Administered 2014-02-01: 50 ug via INTRAVENOUS
  Administered 2014-02-01: 25 ug via INTRAVENOUS

## 2014-02-01 MED ORDER — FENTANYL CITRATE 0.05 MG/ML IJ SOLN
INTRAMUSCULAR | Status: AC
  Filled 2014-02-01: qty 4

## 2014-02-01 NOTE — Progress Notes (Signed)
Keith Flores presents today for phlebotomy per MD orders. HGB/HCT:18.3/54 Phlebotomy procedure started at 1125 and ended at 1200 500 cc removed.( #18 left ac obtained 30 cc then stopped then dc'd and started #18 right a/c and obtained 470cc for a total of 500cc) Patient tolerated procedure well. IV needle removed intact.

## 2014-02-01 NOTE — H&P (Signed)
Chief Complaint: "I'm having a bone marrow biopsy"  Referring Physician(s): Feng,Yan  History of Present Illness: Keith Flores is a 54 y.o. 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 He presents today for CT guided bone marrow biopsy for further evaluation.   Past Medical History  Diagnosis Date  . Hypertension   . Diabetes mellitus   . Asthma   . Hyperlipidemia   . Coronary artery disease     CABG 12/12 (LIMA-LAD, SVG-DX, SVG-OM, SVG-PDA) c/b post op AFib Rx with amio Rx. s/p successful PCI of the right coronary artery utilizing overlapping drug-eluting stents in the mid and distal vessel (done after abnormal nuc)  . Chest pain     01/2011 in setting of HTN urgency: Chest CT demonstrated no pulmonary embolus, postoperative appearance following CABG and small left pleural effusion and dependent atelectasis in the left lung  . Elevated hemoglobin     Past Surgical History  Procedure Laterality Date  . Cardiac catheterization    . Coronary artery bypass graft  12/30/2010    Procedure: CORONARY ARTERY BYPASS GRAFTING (CABG);  Surgeon: Grace Isaac, MD;  Location: [place];  Service: Open Heart Surgery;  Laterality: N/A;  . Left heart catheterization with coronary/graft angiogram  03/07/2011    Procedure: LEFT HEART CATHETERIZATION WITH Beatrix Fetters;  Surgeon: Sherren Mocha, MD;  Location: [facility] CATH LAB;  Service: Cardiovascular;;  . Percutaneous coronary stent intervention (pci-s)  03/07/2011    Procedure: PERCUTANEOUS CORONARY STENT INTERVENTION (PCI-S);  Surgeon: Sherren Mocha, MD;  Location: [facility] CATH LAB;  Service: Cardiovascular;;  . Left heart catheterization with coronary angiogram N/A 01/12/2013    Procedure: LEFT HEART CATHETERIZATION WITH CORONARY ANGIOGRAM;  Surgeon: Blane Ohara, MD;  Location: [facility] C[facility] LAB;  Service: Cardiovascular;  Laterality: N/A;     Allergies: Review of patient's allergies indicates no known allergies.  Medications: Prior to Admission medications   Medication Sig Start Date End Date Taking? Authorizing Provider  amLODipine (NORVASC) 10 MG tablet Take 1 tablet (10 mg total) by mouth daily. 09/30/13   Billy Fischer, MD  aspirin 81 MG tablet Take 81 mg by mouth daily. 03/08/11   Dayna N Dunn, PA-C  carvedilol (COREG) 25 MG tablet Take 1 tablet (25 mg total) by mouth 2 (two) times daily with a meal. 09/30/13   Billy Fischer, MD  clopidogrel (PLAVIX) 75 MG tablet Take 1 tablet (75 mg total) by mouth daily. 09/30/13   Billy Fischer, MD  enalapril (VASOTEC) 10 MG tablet Take 1 tablet (10 mg total) by mouth 2 (two) times daily. 09/30/13   Billy Fischer, MD  metFORMIN (GLUCOPHAGE) 1000 MG tablet Take 1 tablet (1,000 mg total) by mouth daily after lunch. 06/21/13   Blane Ohara, MD  nitroGLYCERIN (NITROSTAT) 0.4 MG SL tablet Place 1 tablet (0.4 mg total) under the tongue every 5 (five) minutes as needed. For chest pain Patient not taking: Reported on 01/18/2014 06/21/13 06/21/14  Blane Ohara, MD  potassium chloride SA (K-DUR,KLOR-CON) 20 MEQ tablet Take 1 tablet (20 mEq total) by mouth as needed. Patient not taking: Reported on 01/18/2014 06/21/13 06/21/14  Blane Ohara, MD  pravastatin (PRAVACHOL) 40 MG tablet Take 1 tablet (40 mg total) by mouth every evening. Patient not taking: Reported on 01/18/2014 01/18/14 01/18/15  Blane Ohara, MD    Family History  Problem Relation Age of Onset  . Anesthesia problems Neg Hx   .  Hypotension Neg Hx   . Malignant hyperthermia Neg Hx   . Pseudochol deficiency Neg Hx     History   Social History  . Marital Status: Married    Spouse Name: N/A    Number of Children: N/A  . Years of Education: N/A   Social History Main Topics  . Smoking status: Current Some Day Smoker    Last Attempt to Quit: 10/21/2010  . Smokeless tobacco: Never Used  . Alcohol Use: No  . Drug Use: No   . Sexual Activity: Yes   Other Topics Concern  . Not on file   Social History Narrative      Review of Systems  Constitutional: Negative for fever and chills.  Respiratory: Negative for cough and shortness of breath.   Cardiovascular: Negative for chest pain.  Gastrointestinal: Negative for vomiting, abdominal pain and blood in stool.  Genitourinary: Negative for dysuria and hematuria.  Musculoskeletal: Positive for back pain.  Neurological: Negative for headaches.  Hematological: Does not bruise/bleed easily.    Vital Signs: BP 143/84 mmHg  Pulse 83  Temp(Src) 98 F (36.7 C) (Oral)  Resp 18  SpO2 97%  Physical Exam  Constitutional: He is oriented to person, place, and time. He appears well-developed and well-nourished.  Cardiovascular: Normal rate and regular rhythm.   Pulmonary/Chest: Effort normal and breath sounds normal.  Abdominal: Soft. Bowel sounds are normal. There is no tenderness.  Musculoskeletal: Normal range of motion.  Trace edema  Neurological: He is alert and oriented to person, place, and time.    Imaging: No results found.  Labs:  CBC:  Recent Labs  04/25/13 1449 05/09/13 1441 05/23/13 0831 01/18/14 1416  WBC 7.7 6.7 6.1 5.7  HGB 17.0 16.0 15.1 18.5*  HCT 51.6* 49.5 46.8 54.5*  PLT 228 232 213 168    COAGS: No results for input(s): INR, APTT in the last 8760 hours.  BMP:  Recent Labs  02/28/13 1447 04/25/13 1449 05/23/13 0834 01/18/14 1416  NA 139 141 141 139  K 4.0 3.8 4.2 4.0  CO2 $Re'26 26 25 29  'VxY$ GLUCOSE 217* 240* 228* 144*  BUN 17.1 14.9 17.3 16.1  CALCIUM 9.8 9.6 9.3 9.5  CREATININE 0.9 1.0 1.1 1.0    LIVER FUNCTION TESTS:  Recent Labs  02/28/13 1447 01/18/14 1416  BILITOT 1.14 1.47*  AST 20 19  ALT 36 35  ALKPHOS 60 69  PROT 7.1 7.4  ALBUMIN 4.1 4.1    TUMOR MARKERS: No results for input(s): AFPTM, CEA, CA199, CHROMGRNA in the last 8760 hours.  Assessment and Plan: Keith Flores is a 54  y.o. 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 He presents today for CT guided bone marrow biopsy for further evaluation. Details/risks of procedure d/w pt via interpreter with his understanding and consent.      Signed: Autumn Messing 02/01/2014, 8:41 AM

## 2014-02-01 NOTE — Progress Notes (Signed)
Pt states he took his Plavix on 01/31/14 at 1000. Keith Robert PA radiology notified of this and confirmed that we are to continue prepping patient for his CT Bone Marrow Biopsy. Pt speaking and understanding instructions in English but he does have an Arabic interpretor Keith Flores present to clarify any questions/instructions. Pt has a scheduled appointment at 1300 at the [facility] and is aware he cannot drive today due to sedation. He has called friend Keith Flores to pick him up after his [place] appointment

## 2014-02-01 NOTE — Progress Notes (Signed)
Dr Pascal Lux said it was "OK to do phlebotomy in recovery phase of CT bone marrow instead of having patient to go to [place] at 1300 for his scheduled appointment". Spoke with Dr Burr Medico and reported Hgb and Hct (see lab results) and OK to do the theraputic phlebotomy of 500cc slowly while in short stay. Orders received.

## 2014-02-01 NOTE — Discharge Instructions (Signed)
Bone Marrow Aspiration, Bone Marrow Biopsy °Care After °Read the instructions outlined below and refer to this sheet in the next few weeks. These discharge instructions provide you with general information on caring for yourself after you leave the hospital. Your caregiver may also give you specific instructions. While your treatment has been planned according to the most current medical practices available, unavoidable complications occasionally occur. If you have any problems or questions after discharge, call your caregiver. °FINDING OUT THE RESULTS OF YOUR TEST °Not all test results are available during your visit. If your test results are not back during the visit, make an appointment with your caregiver to find out the results. Do not assume everything is normal if you have not heard from your caregiver or the medical facility. It is important for you to follow up on all of your test results.  °HOME CARE INSTRUCTIONS  °You have had sedation and may be sleepy or dizzy. Your thinking may not be as clear as usual. For the next 24 hours: °· Only take over-the-counter or prescription medicines for pain, discomfort, and or fever as directed by your caregiver. °· Do not drink alcohol. °· Do not smoke. °· Do not drive. °· Do not make important legal decisions. °· Do not operate heavy machinery. °· Do not care for small children by yourself. °· Keep your dressing clean and dry. You may replace dressing with a bandage after 24 hours. °· You may take a bath or shower after 24 hours. °· Use an ice pack for 20 minutes every 2 hours while awake for pain as needed. °SEEK MEDICAL CARE IF:  °· There is redness, swelling, or increasing pain at the biopsy site. °· There is pus coming from the biopsy site. °· There is drainage from a biopsy site lasting longer than one day. °· An unexplained oral temperature above 102° F (38.9° C) develops. °SEEK IMMEDIATE MEDICAL CARE IF:  °· You develop a rash. °· You have difficulty  breathing. °· You develop any reaction or side effects to medications given. °Document Released: 07/26/2004 Document Revised: 03/31/2011 Document Reviewed: 01/04/2008 °ExitCare® Patient Information ©2015 [employer]. This information is not intended to replace advice given to you by your health care provider. Make sure you discuss any questions you have with your health care provider. °Conscious Sedation, Adult, Care After °Refer to this sheet in the next few weeks. These instructions provide you with information on caring for yourself after your procedure. Your health care provider may also give you more specific instructions. Your treatment has been planned according to current medical practices, but problems sometimes occur. Call your health care provider if you have any problems or questions after your procedure. °WHAT TO EXPECT AFTER THE PROCEDURE  °After your procedure: °· You may feel sleepy, clumsy, and have poor balance for several hours. °· Vomiting may occur if you eat too soon after the procedure. °HOME CARE INSTRUCTIONS °· Do not participate in any activities where you could become injured for at least 24 hours. Do not: °¨ Drive. °¨ Swim. °¨ Ride a bicycle. °¨ Operate heavy machinery. °¨ Cook. °¨ Use power tools. °¨ Climb ladders. °¨ Work from a high place. °· Do not make important decisions or sign legal documents until you are improved. °· If you vomit, drink water, juice, or soup when you can drink without vomiting. Make sure you have little or no nausea before eating solid foods. °· Only take over-the-counter or prescription medicines for pain, discomfort, or fever   as directed by your health care provider.  Make sure you and your family fully understand everything about the medicines given to you, including what side effects may occur.  You should not drink alcohol, take sleeping pills, or take medicines that cause drowsiness for at least 24 hours.  If you smoke, do not smoke without  supervision.  If you are feeling better, you may resume normal activities 24 hours after you were sedated.  Keep all appointments with your health care provider. SEEK MEDICAL CARE IF:  Your skin is pale or bluish in color.  You continue to feel nauseous or vomit.  Your pain is getting worse and is not helped by medicine.  You have bleeding or swelling.  You are still sleepy or feeling clumsy after 24 hours. SEEK IMMEDIATE MEDICAL CARE IF:  You develop a rash.  You have difficulty breathing.  You develop any type of allergic problem.  You have a fever. MAKE SURE YOU:  Understand these instructions.  Will watch your condition.  Will get help right away if you are not doing well or get worse. Document Released: 10/27/2012 Document Reviewed: 10/27/2012 [facility] Patient Information 2015 [place], [place]. This information is not intended to replace advice given to you by your health care provider. Make sure you discuss any questions you have with your health care provider.

## 2014-02-01 NOTE — Telephone Encounter (Signed)
, °

## 2014-02-01 NOTE — Procedures (Signed)
Technically successful CT guided bone marrow aspiration and biopsy of left iliac crest. No immediate complications.   

## 2014-02-07 ENCOUNTER — Ambulatory Visit (HOSPITAL_BASED_OUTPATIENT_CLINIC_OR_DEPARTMENT_OTHER): Payer: Medicaid Other

## 2014-02-07 ENCOUNTER — Other Ambulatory Visit (HOSPITAL_BASED_OUTPATIENT_CLINIC_OR_DEPARTMENT_OTHER): Payer: Medicaid Other

## 2014-02-07 VITALS — BP 118/58 | HR 83 | Resp 18

## 2014-02-07 DIAGNOSIS — D751 Secondary polycythemia: Secondary | ICD-10-CM

## 2014-02-07 DIAGNOSIS — Z23 Encounter for immunization: Secondary | ICD-10-CM | POA: Diagnosis not present

## 2014-02-07 LAB — CBC WITH DIFFERENTIAL/PLATELET
BASO%: 0.9 % (ref 0.0–2.0)
BASOS ABS: 0.1 10*3/uL (ref 0.0–0.1)
EOS ABS: 0.5 10*3/uL (ref 0.0–0.5)
EOS%: 6.2 % (ref 0.0–7.0)
HCT: 54.3 % — ABNORMAL HIGH (ref 38.4–49.9)
HEMOGLOBIN: 17.5 g/dL — AB (ref 13.0–17.1)
LYMPH#: 1.7 10*3/uL (ref 0.9–3.3)
LYMPH%: 22.4 % (ref 14.0–49.0)
MCH: 28.7 pg (ref 27.2–33.4)
MCHC: 32.3 g/dL (ref 32.0–36.0)
MCV: 88.9 fL (ref 79.3–98.0)
MONO#: 0.5 10*3/uL (ref 0.1–0.9)
MONO%: 6.3 % (ref 0.0–14.0)
NEUT#: 5 10*3/uL (ref 1.5–6.5)
NEUT%: 64.2 % (ref 39.0–75.0)
Platelets: 216 10*3/uL (ref 140–400)
RBC: 6.1 10*6/uL — ABNORMAL HIGH (ref 4.20–5.82)
RDW: 16 % — ABNORMAL HIGH (ref 11.0–14.6)
WBC: 7.7 10*3/uL (ref 4.0–10.3)

## 2014-02-07 MED ORDER — INFLUENZA VAC SPLIT QUAD 0.5 ML IM SUSY
0.5000 mL | PREFILLED_SYRINGE | Freq: Once | INTRAMUSCULAR | Status: AC
  Administered 2014-02-07: 0.5 mL via INTRAMUSCULAR
  Filled 2014-02-07: qty 0.5

## 2014-02-07 NOTE — Patient Instructions (Signed)
Phlebitis Phlebitis is soreness and puffiness (swelling) in a vein.  HOME CARE  Only take medicine as told by your doctor.  Raise (elevate) the affected limb on a pillow as told by your doctor.  Keep a warm pack on the affected vein as told by your doctor. Do not sleep with a heating pad.  Use special stockings or bandages around the area of the affected vein as told by your doctor. These will speed healing and keep the condition from coming back.  Talk to your doctor about all the medicines you take.  Get follow-up blood tests as told by your doctor.  If the phlebitis is in your legs:  Avoid standing or resting for long periods.  Keep your legs moving. Raise your legs when you sit or lie.  Do not smoke.  Follow-up with your doctor as told. GET HELP IF:  You have strange bruises or bleeding.  Your puffiness or pain in the affected area is not getting better.  You are taking medicine to lessen puffiness (anti-inflammatory medicine), and you get belly pain.  You have a fever. GET HELP RIGHT AWAY IF:   The phlebitis gets worse and you have more pain, puffiness (swelling), or redness.  You have trouble breathing or have chest pain. MAKE SURE YOU:   Understand these instructions.  Will watch your condition.  Will get help right away if you are not doing well or get worse. Document Released: 12/25/2008 Document Revised: 01/11/2013 Document Reviewed: 09/13/2012 [facility] Patient Information 2015 [place], [place]. This information is not intended to replace advice given to you by your health care provider. Make sure you discuss any questions you have with your health care provider.

## 2014-02-07 NOTE — Progress Notes (Signed)
Tolerated 10 min phlebotomy well at  Right Antecubital. Remove 518 cc blood ,presure dressing applied.Tolerated well

## 2014-02-13 LAB — CHROMOSOME ANALYSIS, BONE MARROW

## 2014-02-14 ENCOUNTER — Ambulatory Visit (INDEPENDENT_AMBULATORY_CARE_PROVIDER_SITE_OTHER): Payer: Medicaid Other | Admitting: Cardiovascular Disease

## 2014-02-14 ENCOUNTER — Encounter: Payer: Self-pay | Admitting: Cardiovascular Disease

## 2014-02-14 VITALS — BP 120/76 | HR 93 | Ht 67.0 in | Wt 209.0 lb

## 2014-02-14 DIAGNOSIS — E782 Mixed hyperlipidemia: Secondary | ICD-10-CM

## 2014-02-14 DIAGNOSIS — I10 Essential (primary) hypertension: Secondary | ICD-10-CM

## 2014-02-14 DIAGNOSIS — I2510 Atherosclerotic heart disease of native coronary artery without angina pectoris: Secondary | ICD-10-CM

## 2014-02-14 NOTE — Patient Instructions (Addendum)
Your physician wants you to follow-up in: 1 YEAR with Dr Burt Knack.  You will receive a reminder letter in the mail two months in advance. If you don't receive a letter, please call our office to schedule the follow-up appointment.  Your physician recommends that you return for a FASTING LIPID and LIVER--nothing to eat or drink after midnight. Please have this drawn at you upcoming appointment with    Dr Burr Medico 02/20/14. If they do not draw labs please contact our office and we will schedule appointment for lab work.    Your physician recommends that you continue on your current medications as directed. Please refer to the Current Medication list given to you today.

## 2014-02-14 NOTE — Progress Notes (Signed)
Cardiology Office Note   Date:  02/14/2014   ID:  Keith Flores, DOB Jun 07, 1960, MRN [id number]  PCP:  [name], MD  Cardiologist:  Sherren Mocha, MD    Chief Complaint  Patient presents with  . Follow-up    6 month     History of Present Illness: Keith Flores is a 54 y.o. male who presents for follow-up of coronary artery disease.  He has coronary artery disease status post CABG in 2010. The patient had early graft closure involving the vein graft to right coronary artery and he underwent extensive stenting of the native RCA with a good result. He's followed for hypertension and hyperlipidemia as well.  He complains of occasional chest pains. He feels substernal pressure with certain activities. This is unchanged over time and resolved rapidly with rest. He has not required sublingual nitroglycerin recently. He's had some problems with Medicaid and he was out of all of his medicines for some time. He is now back on his regular medications. He complains of leg swelling and mild shortness of breath with activity. He denies orthopnea, PND, lightheadedness, or heart palpitations.   Past Medical History  Diagnosis Date  . Hypertension   . Diabetes mellitus   . Asthma   . Hyperlipidemia   . Coronary artery disease     CABG 12/12 (LIMA-LAD, SVG-DX, SVG-OM, SVG-PDA) c/b post op AFib Rx with amio Rx. s/p successful PCI of the right coronary artery utilizing overlapping drug-eluting stents in the mid and distal vessel (done after abnormal nuc)  . Chest pain     01/2011 in setting of HTN urgency: Chest CT demonstrated no pulmonary embolus, postoperative appearance following CABG and small left pleural effusion and dependent atelectasis in the left lung  . Elevated hemoglobin     Past Surgical History  Procedure Laterality Date  . Cardiac catheterization    . Coronary artery bypass graft  12/30/2010    Procedure: CORONARY ARTERY BYPASS GRAFTING (CABG);   Surgeon: Grace Isaac, MD;  Location: [facility];  Service: Open Heart Surgery;  Laterality: N/A;  . Left heart catheterization with coronary/graft angiogram  03/07/2011    Procedure: LEFT HEART CATHETERIZATION WITH Beatrix Fetters;  Surgeon: Sherren Mocha, MD;  Location: [facility] C[facility] LAB;  Service: Cardiovascular;;  . Percutaneous coronary stent intervention (pci-s)  03/07/2011    Procedure: PERCUTANEOUS CORONARY STENT INTERVENTION (PCI-S);  Surgeon: Sherren Mocha, MD;  Location: [facility] CATH LAB;  Service: Cardiovascular;;  . Left heart catheterization with coronary angiogram N/A 01/12/2013    Procedure: LEFT HEART CATHETERIZATION WITH CORONARY ANGIOGRAM;  Surgeon: Blane Ohara, MD;  Location: [facility] CATH LAB;  Service: Cardiovascular;  Laterality: N/A;    Current Outpatient Prescriptions  Medication Sig Dispense Refill  . amLODipine (NORVASC) 10 MG tablet Take 1 tablet (10 mg total) by mouth daily. 90 tablet 0  . aspirin 81 MG tablet Take 81 mg by mouth daily.    . carvedilol (COREG) 25 MG tablet Take 1 tablet (25 mg total) by mouth 2 (two) times daily with a meal. 180 tablet 0  . clopidogrel (PLAVIX) 75 MG tablet Take 1 tablet (75 mg total) by mouth daily. 90 tablet 0  . enalapril (VASOTEC) 10 MG tablet Take 1 tablet (10 mg total) by mouth 2 (two) times daily. 180 tablet 0  . metFORMIN (GLUCOPHAGE) 1000 MG tablet Take 1 tablet (1,000 mg total) by mouth daily after lunch. 90 tablet 0  . multivitamin-iron-minerals-folic acid (CENTRUM) chewable tablet Chew  1 tablet by mouth daily with breakfast.    . nitroGLYCERIN (NITROSTAT) 0.4 MG SL tablet Place 1 tablet (0.4 mg total) under the tongue every 5 (five) minutes as needed. For chest pain 25 tablet 3  . pravastatin (PRAVACHOL) 40 MG tablet Take 1 tablet (40 mg total) by mouth every evening. 90 tablet 0   No current facility-administered medications for this visit.    Allergies:   Review of patient's allergies indicates no known allergies.    Social History:  The patient  reports that he has been smoking.  He has never used smokeless tobacco. He reports that he does not drink alcohol or use illicit drugs.   Family History:  The patient's  family history is negative for Anesthesia problems, Hypotension, Malignant hyperthermia, and Pseudochol deficiency.    ROS:  Please see the history of present illness.  Otherwise, review of systems is positive for chest pain, leg swelling, exertional dyspnea, back pain, muscle pain in the thighs, rash on the upper arms and legs, wheezing, constipation, and headaches.  All other systems are reviewed and negative.    PHYSICAL EXAM: VS:  BP 120/76 mmHg  Pulse 93  Ht 5\' 7"  (1.702 m)  Wt 209 lb (94.802 kg)  BMI 32.73 kg/m2 , BMI Body mass index is 32.73 kg/(m^2). GEN: Well nourished, well developed, in no acute distress HEENT: normal Neck: no JVD, carotid bruits, or masses Cardiac: RRR; no murmurs, rubs, or gallops,no edema  Respiratory:  clear to auscultation bilaterally, normal work of breathing GI: soft, nontender, nondistended, + BS MS: no deformity or atrophy Skin: warm and dry, no rash Neuro:  Strength and sensation are intact Psych: euthymic mood, full affect  EKG:  EKG is ordered today. The ekg ordered today shows NSR 93 bpm, within normal limits.  Recent Labs: 01/18/2014: ALT 35; BUN 16.1; Creatinine 1.0; Potassium 4.0; Sodium 139 02/07/2014: Hemoglobin 17.5*; Platelets 216   Lipid Panel     Component Value Date/Time   CHOL 128 01/04/2013 0754   TRIG 104.0 01/04/2013 0754   HDL 31.60* 01/04/2013 0754   CHOLHDL 4 01/04/2013 0754   VLDL 20.8 01/04/2013 0754   LDLCALC 76 01/04/2013 0754      Wt Readings from Last 3 Encounters:  02/14/14 209 lb (94.802 kg)  01/18/14 209 lb 14.4 oz (95.21 kg)  05/23/13 215 lb 9.6 oz (97.796 kg)     Other studies Reviewed: Cardiac catheterization 01/12/2013: Final Conclusions:  1. Severe native three-vessel coronary artery disease  with continued patency of the native RCA stented segment, severe stenosis of the LAD with brisk filling from the mammary graft, and continued patency of the saphenous vein graft to left circumflex distribution  2. Status post aortocoronary bypass surgery with known occlusion of the saphenous vein graft to PDA, patency of the vein graft to OM and diagonal, and continued patency of the LIMA to LAD  3. Preserved left ventricular systolic function  Recommendations: Continued medical therapy. The patient has stable coronary anatomy.   ASSESSMENT AND PLAN: 1.  Coronary artery disease, native vessel, with CCS class II anginal symptoms. The patient's symptoms are stable. There has been no significant change since he underwent heart catheterization one year ago. Stressed the importance of medication adherence and lifestyle modification. I have recommended that he continue on long-term dual antiplatelet therapy with aspirin and clopidogrel. I will see him back in one year.  2. Essential hypertension. Blood pressure is well controlled on a combination of amlodipine, carvedilol, enalapril. Next  3. Hyperlipidemia. The patient takes pravastatin. We have added a lipid panel to his upcoming lab work.  4. Erythrocytosis. He undergoes frequent phlebotomy. He's followed at the cancer center.   Current medicines are reviewed with the patient today.  The patient does not have concerns regarding medicines.  The following changes have been made:  no change  Labs/ tests ordered today include:  Lipid panel    Orders Placed This Encounter  Procedures  . Lipid panel  . Hepatic function panel  . EKG 12-Lead    Disposition:   FU with me in one year  Signed, Sherren Mocha, MD  02/14/2014 6:15 PM    [facility] [address], [place], [place]  [address] Phone: [phone]; Fax: [phone]

## 2014-02-20 ENCOUNTER — Telehealth: Payer: Self-pay | Admitting: *Deleted

## 2014-02-20 ENCOUNTER — Telehealth: Payer: Self-pay | Admitting: Hematology

## 2014-02-20 ENCOUNTER — Ambulatory Visit (HOSPITAL_BASED_OUTPATIENT_CLINIC_OR_DEPARTMENT_OTHER): Payer: Medicaid Other

## 2014-02-20 ENCOUNTER — Other Ambulatory Visit (HOSPITAL_BASED_OUTPATIENT_CLINIC_OR_DEPARTMENT_OTHER): Payer: Medicaid Other

## 2014-02-20 ENCOUNTER — Ambulatory Visit (HOSPITAL_BASED_OUTPATIENT_CLINIC_OR_DEPARTMENT_OTHER): Payer: Medicaid Other | Admitting: Hematology

## 2014-02-20 ENCOUNTER — Encounter: Payer: Self-pay | Admitting: Hematology

## 2014-02-20 VITALS — BP 129/71 | HR 81 | Temp 97.7°F | Resp 18 | Ht 67.0 in | Wt 210.4 lb

## 2014-02-20 DIAGNOSIS — D751 Secondary polycythemia: Secondary | ICD-10-CM

## 2014-02-20 LAB — CBC WITH DIFFERENTIAL/PLATELET
BASO%: 0.7 % (ref 0.0–2.0)
Basophils Absolute: 0 10*3/uL (ref 0.0–0.1)
EOS%: 6.3 % (ref 0.0–7.0)
Eosinophils Absolute: 0.5 10*3/uL (ref 0.0–0.5)
HCT: 49.4 % (ref 38.4–49.9)
HGB: 16.1 g/dL (ref 13.0–17.1)
LYMPH%: 31.1 % (ref 14.0–49.0)
MCH: 29.2 pg (ref 27.2–33.4)
MCHC: 32.5 g/dL (ref 32.0–36.0)
MCV: 89.7 fL (ref 79.3–98.0)
MONO#: 0.5 10*3/uL (ref 0.1–0.9)
MONO%: 7.2 % (ref 0.0–14.0)
NEUT#: 3.9 10*3/uL (ref 1.5–6.5)
NEUT%: 54.7 % (ref 39.0–75.0)
PLATELETS: 223 10*3/uL (ref 140–400)
RBC: 5.51 10*6/uL (ref 4.20–5.82)
RDW: 15.5 % — ABNORMAL HIGH (ref 11.0–14.6)
WBC: 7.2 10*3/uL (ref 4.0–10.3)
lymph#: 2.2 10*3/uL (ref 0.9–3.3)

## 2014-02-20 NOTE — Progress Notes (Signed)
[place], [place] [address]  DIAGNOSIS: Erythrocytosis, likely secondary to smoking  Chief Complaint  Patient presents with  . Follow-up    erythrocytosis    CURRENT THERAPY: Phlebotomy to maintain hct less than 45%.    INTERVAL HISTORY: Keith Flores 54 y.o. male  who is here for further follow up for erythrocytosis.  He was last seen by Dr. Juliann Flores 6 month ago, and has not been followed due to his travel. Of note, he has an extensive cardiac history including CAD s/p CABG in 2010, Hypertension and hyperlipidemia for which he is being managed by cardiology. He had a lexiscan myoview recently due to test discomfort which was revealed the following: Low risk stress nuclear study with a moderate size, medium intensity, partially reversible inferior defect consistent with inferior thinning vs prior infarct and mild inferior ischemia. LV Ejection Fraction: 52%. LV Wall Motion: NL LV Function; NL Wall Motion. He was last seen by Dr. Sherren Flores 12/2012.   He denies being told he had an elevated hemoglobin on prior CBCs. Looking through his lab values, he has had significant fluctuations with his hct being 53.6 to as low as 39 on 12/30/2010 to as high as 57.7 on 01/10/2013. He also denied feeling very itchy after taking a shower or bathing. He denied burning pain in his feet or cyanosis or discolorations. He also denied chest and abdominal pain, myalgia and weakness, fatigue, headache, blurred vision, transient loss of vision, paresthesias, slow mentation. He denied a history of blood clots or gout. He smoked about 6 cigarettes daily and smoked for many years prior. He denied any dysuria or hematuria or weight changes. He denied living in high altitudes or the use of testosterone.   INTERIM HISTORY [name] returns for follow-up, he is accompanied by an interpreter.  He is feeling well overall. He tolerated  the bone marrow biopsy well. He did phlebotomies 2 times in the past one months and as tolerated well. He denies any other new symptoms. No chest pain, headache, or other symptoms.  MEDICAL HISTORY: Past Medical History  Diagnosis Date  . Hypertension   . Diabetes mellitus   . Asthma   . Hyperlipidemia   . Coronary artery disease     CABG 12/12 (LIMA-LAD, SVG-DX, SVG-OM, SVG-PDA) c/b post op AFib Rx with amio Rx. s/p successful PCI of the right coronary artery utilizing overlapping drug-eluting stents in the mid and distal vessel (done after abnormal nuc)  . Chest pain     01/2011 in setting of HTN urgency: Chest CT demonstrated no pulmonary embolus, postoperative appearance following CABG and small left pleural effusion and dependent atelectasis in the left lung  . Elevated hemoglobin      ALLERGIES:  has No Known Allergies.  MEDICATIONS: has a current medication list which includes the following prescription(s): amlodipine, aspirin, carvedilol, clopidogrel, enalapril, glimepiride, metformin, multivitamin-iron-minerals-folic acid, pravastatin, and nitroglycerin.  SURGICAL HISTORY:  Past Surgical History  Procedure Laterality Date  . Cardiac catheterization    . Coronary artery bypass graft  12/30/2010    Procedure: CORONARY ARTERY BYPASS GRAFTING (CABG);  Surgeon: Keith Isaac, MD;  Location: [facility];  Service: Open Heart Surgery;  Laterality: N/A;  . Left heart catheterization with coronary/graft angiogram  03/07/2011    Procedure: LEFT HEART CATHETERIZATION WITH Keith Flores;  Surgeon: Keith Mocha, MD;  Location: [facility] CATH LAB;  Service: Cardiovascular;;  . Percutaneous  coronary stent intervention (pci-s)  03/07/2011    Procedure: PERCUTANEOUS CORONARY STENT INTERVENTION (PCI-S);  Surgeon: Keith Mocha, MD;  Location: [facility] CATH LAB;  Service: Cardiovascular;;  . Left heart catheterization with coronary angiogram N/A 01/12/2013    Procedure: LEFT HEART CATHETERIZATION  WITH CORONARY ANGIOGRAM;  Surgeon: Keith Ohara, MD;  Location: [facility] CATH LAB;  Service: Cardiovascular;  Laterality: N/A;    REVIEW OF SYSTEMS:   Constitutional: Denies fevers, chills or abnormal weight loss Eyes: Denies blurriness of vision Ears, nose, mouth, throat, and face: Denies mucositis or sore throat Respiratory: Denies cough, dyspnea or wheezes Cardiovascular: Denies palpitation, chest discomfort or lower extremity swelling Gastrointestinal:  Denies nausea, heartburn or change in bowel habits Skin: Denies abnormal skin rashes Lymphatics: Denies new lymphadenopathy or easy bruising Neurological:Denies numbness, tingling or new weaknesses Behavioral/Psych: Mood is stable, no new changes  All other systems were reviewed with the patient and are negative.  PHYSICAL EXAMINATION: ECOG PERFORMANCE STATUS: 0 - Asymptomatic  Blood pressure 129/71, pulse 81, temperature 97.7 F (36.5 C), temperature source Oral, resp. rate 18, height $RemoveBe'5\' 7"'ldcYpFewV$  (1.702 m), weight 210 lb 6.4 oz (95.437 kg), SpO2 98 %.  GENERAL:alert, no distress and comfortable; moderately obese SKIN: skin color, texture, turgor are normal, no rashes or significant lesions EYES: normal, Conjunctiva are pink and non-injected, sclera clear OROPHARYNX:no exudate, no erythema and lips, buccal mucosa, and tongue normal  NECK: supple, thyroid normal size, non-tender, without nodularity LYMPH:  no palpable lymphadenopathy in the cervical, axillary or supraclavicular LUNGS: clear to auscultation and percussion with normal breathing effort HEART: regular rate & rhythm and no murmurs and no lower extremity edema ABDOMEN:abdomen soft, non-tender and normal bowel sounds Musculoskeletal:no cyanosis of digits and no clubbing  NEURO: alert & oriented x 3 with fluent speech, no focal motor/sensory deficits  LABORATORY DATA: CBC Latest Ref Rng 02/20/2014 02/07/2014 02/01/2014  WBC 4.0 - 10.3 10e3/uL 7.2 7.7 6.7  Hemoglobin 13.0 - 17.1  g/dL 16.1 17.5(H) 18.3(H)  Hematocrit 38.4 - 49.9 % 49.4 54.3(H) 54.0(H)  Platelets 140 - 400 10e3/uL 223 216 194    CMP Latest Ref Rng 01/18/2014 05/23/2013 04/25/2013  Glucose 70 - 140 mg/dl 144(H) 228(H) 240(H)  BUN 7.0 - 26.0 mg/dL 16.1 17.3 14.9  Creatinine 0.7 - 1.3 mg/dL 1.0 1.1 1.0  Sodium 136 - 145 mEq/L 139 141 141  Potassium 3.5 - 5.1 mEq/L 4.0 4.2 3.8  Chloride 96 - 112 mEq/L - - -  CO2 22 - 29 mEq/L $Remove'29 25 26  'CFQzEKr$ Calcium 8.4 - 10.4 mg/dL 9.5 9.3 9.6  Total Protein 6.4 - 8.3 g/dL 7.4 - -  Total Bilirubin 0.20 - 1.20 mg/dL 1.47(H) - -  Alkaline Phos 40 - 150 U/L 69 - -  AST 5 - 34 U/L 19 - -  ALT 0 - 55 U/L 35 - -   Erythropoietin  Status: Finalresult Visible to patient:  Not Released Nextappt: 02/01/2014 at 09:00 AM in [facility]) Dx:  Polycythemia, secondary         Ref Range 43yr ago    Erythropoietin 2.6 - 18.5 mIU/mL 13.7      PATHOLOGY REPORT 02/01/2014 Diagnosis Bone Marrow, Aspirate,Biopsy, and Clot, left iliac crest - SLIGHTLY HYPERCELLULAR BONE MARROW FOR AGE WITH TRILINEAGE HEMATOPOIESIS. - SEE COMMENT. PERIPHERAL BLOOD: - ERYTHROCYTOSIS. Diagnosis Note The overall findings are not considered specific or diagnostic of a myeloproliferative neoplasm. Correlation with cytogenetic and JAK-2 mutation analysis is recommended.  Cytogenetics: normal   RADIOGRAPHIC  STUDIES: 05/20/2013 CT ABDOMEN WITH CONTRAST TECHNIQUE:  Multidetector CT imaging of the abdomen was performed using the standard protocol following bolus administration of intravenous contrast. CONTRAST: 141mL OMNIPAQUE IOHEXOL 300 MG/ML SOLN  COMPARISON: 04/12/2006 FINDINGS: The lung bases appear clear. No pleural or pericardial effusion.  No focal liver abnormality identified. The gallbladder is normal. There is no biliary dilatation. The spleen is unremarkable measuring 10.2 cm in length. The adrenal glands are both normal. The right kidney is normal. Cyst within the  upper pole of the left kidney is identified measuring 1.2 cm. Normal caliber of the abdominal aorta. No aneurysm. There is no upper abdominal adenopathy identified. The stomach is normal. The small bowel loops are unremarkable. Normal appearance of the colon. Review of the visualized osseous structures is significant for mild spondylosis. IMPRESSION: 1. No acute findings identified within the upper abdomen. 2. No mass or adenopathy and no evidence for splenomegaly.    ASSESSMENT: Keith Flores 54 y.o. male with a history of Erythrocytosis   Hans Antonieta Pert is a 54 y.o. male with a history of CAD/CABG presents with persistence of elevated (H/H) without thrombocytosis, leukocytosis. He also has an elevated vitamin b12. CT of Abdomen (2008) was negative for splenomegaly. JAK2 was negative. Normal EPO level. He denies classic aquagenic pruritus, negative for erythromegalalia. Secondary causes due to [place], [place] and steroids/testerone are felt to be less likely, but he dose have extensive smoking history.  1.  Polycythemia, secondary to smoking  --He only needs one major WHO criteria for P vera, and does not meet any of the minor criteria. I reviewed his bone marrow biopsy with him, which does not stop support myeloproliferative disorder. Check 2 was negative, his erythropoietin level was normal.  -I think his polycythemia is probable secondary, likely related to his extensive smoking history., Although he has never been hypoxic.  -Given his prior extensive history of coronary artery disease, I recommend continue phlebotomy to keep his hematocrit less than 45%. I do not recommend Hydrea.  -- I reiterated the importance of compliance with his daily baby aspirin (81 mg) daily.   2. CAD -continue follow up with his cardiologist   3. HTN, DM, hyperlipidemia -follow up with PCP    PLAN: 1. Phlebotomy today and and every 2 weeks for hematocrit above 45%  2. Return to clinic in 2 months for  follow-up.   All questions were answered. The patient knows to call the clinic with any problems, questions or concerns. We can certainly see the patient much sooner if necessary.  I spent 20 minutes counseling the patient face to face in the presence of an arabic intepreter. The total time spent in the appointment was 25 minutes.    Truitt Merle, MD 02/20/2014

## 2014-02-20 NOTE — Telephone Encounter (Signed)
Per staff message and POF I have scheduled appts. Advised scheduler of appts. JMW  

## 2014-02-20 NOTE — Progress Notes (Signed)
Phlebotomy performed for 525 mls blood via R ACF with #16 fr. Needle. Tolerated well. VSS pre and post phlebotomy.  Given snacks and beverage.

## 2014-02-20 NOTE — Telephone Encounter (Signed)
Gave avs & calendar for February thru March

## 2014-02-20 NOTE — Patient Instructions (Signed)

## 2014-03-01 ENCOUNTER — Encounter (HOSPITAL_COMMUNITY): Payer: Self-pay

## 2014-03-07 ENCOUNTER — Other Ambulatory Visit (HOSPITAL_BASED_OUTPATIENT_CLINIC_OR_DEPARTMENT_OTHER): Payer: Medicaid Other

## 2014-03-07 ENCOUNTER — Ambulatory Visit (HOSPITAL_BASED_OUTPATIENT_CLINIC_OR_DEPARTMENT_OTHER): Payer: Medicaid Other

## 2014-03-07 ENCOUNTER — Telehealth: Payer: Self-pay | Admitting: *Deleted

## 2014-03-07 DIAGNOSIS — D751 Secondary polycythemia: Secondary | ICD-10-CM

## 2014-03-07 LAB — CBC WITH DIFFERENTIAL/PLATELET
BASO%: 0.6 % (ref 0.0–2.0)
BASOS ABS: 0.1 10*3/uL (ref 0.0–0.1)
EOS%: 6.3 % (ref 0.0–7.0)
Eosinophils Absolute: 0.5 10*3/uL (ref 0.0–0.5)
HEMATOCRIT: 48.1 % (ref 38.4–49.9)
HEMOGLOBIN: 16.2 g/dL (ref 13.0–17.1)
LYMPH%: 27.8 % (ref 14.0–49.0)
MCH: 29.5 pg (ref 27.2–33.4)
MCHC: 33.7 g/dL (ref 32.0–36.0)
MCV: 87.6 fL (ref 79.3–98.0)
MONO#: 0.4 10*3/uL (ref 0.1–0.9)
MONO%: 5.6 % (ref 0.0–14.0)
NEUT%: 59.7 % (ref 39.0–75.0)
NEUTROS ABS: 4.7 10*3/uL (ref 1.5–6.5)
PLATELETS: 219 10*3/uL (ref 140–400)
RBC: 5.49 10*6/uL (ref 4.20–5.82)
RDW: 13.4 % (ref 11.0–14.6)
WBC: 7.8 10*3/uL (ref 4.0–10.3)
lymph#: 2.2 10*3/uL (ref 0.9–3.3)

## 2014-03-07 NOTE — Telephone Encounter (Signed)
I have adjusted 3/1 appt 

## 2014-03-07 NOTE — Progress Notes (Signed)
Therapeutic phlebotomy done with # 16 g needle/phelbotomy set via R ACF for 550 g.  Tolerated very well. VSS pre and post phlebotomy. Beverages and snack given.

## 2014-03-07 NOTE — Patient Instructions (Signed)

## 2014-03-21 ENCOUNTER — Ambulatory Visit (HOSPITAL_BASED_OUTPATIENT_CLINIC_OR_DEPARTMENT_OTHER): Payer: Medicaid Other

## 2014-03-21 ENCOUNTER — Other Ambulatory Visit (HOSPITAL_BASED_OUTPATIENT_CLINIC_OR_DEPARTMENT_OTHER): Payer: Medicaid Other

## 2014-03-21 ENCOUNTER — Other Ambulatory Visit: Payer: Medicaid Other

## 2014-03-21 DIAGNOSIS — D751 Secondary polycythemia: Secondary | ICD-10-CM

## 2014-03-21 LAB — CBC WITH DIFFERENTIAL/PLATELET
BASO%: 1.1 % (ref 0.0–2.0)
Basophils Absolute: 0.1 10*3/uL (ref 0.0–0.1)
EOS ABS: 0.4 10*3/uL (ref 0.0–0.5)
EOS%: 6.3 % (ref 0.0–7.0)
HCT: 45.7 % (ref 38.4–49.9)
HGB: 15.1 g/dL (ref 13.0–17.1)
LYMPH%: 32.9 % (ref 14.0–49.0)
MCH: 28.2 pg (ref 27.2–33.4)
MCHC: 33 g/dL (ref 32.0–36.0)
MCV: 85.3 fL (ref 79.3–98.0)
MONO#: 0.6 10*3/uL (ref 0.1–0.9)
MONO%: 9.3 % (ref 0.0–14.0)
NEUT%: 50.4 % (ref 39.0–75.0)
NEUTROS ABS: 3.2 10*3/uL (ref 1.5–6.5)
PLATELETS: 248 10*3/uL (ref 140–400)
RBC: 5.36 10*6/uL (ref 4.20–5.82)
RDW: 13.3 % (ref 11.0–14.6)
WBC: 6.4 10*3/uL (ref 4.0–10.3)
lymph#: 2.1 10*3/uL (ref 0.9–3.3)

## 2014-03-21 LAB — BASIC METABOLIC PANEL (CC13)
ANION GAP: 12 meq/L — AB (ref 3–11)
BUN: 18.5 mg/dL (ref 7.0–26.0)
CALCIUM: 9.7 mg/dL (ref 8.4–10.4)
CO2: 25 mEq/L (ref 22–29)
Chloride: 103 mEq/L (ref 98–109)
Creatinine: 1 mg/dL (ref 0.7–1.3)
EGFR: 90 mL/min/{1.73_m2} — ABNORMAL LOW (ref >90)
Glucose: 133 mg/dl (ref 70–140)
Potassium: 4 mEq/L (ref 3.5–5.1)
Sodium: 140 mEq/L (ref 136–145)

## 2014-03-21 NOTE — Progress Notes (Signed)
[id number] grams removed without difficulty from left ac. Pressure dressing applied. Pt ate snack and drink prior to phlebotomy.  He also had a glass of juice after phlebotomy as well.  1455-Pt states that he will stay for 20 minutes only after phlebotomy.  VS stable and pressure dressing to left ac clean, dry and intact at time of discharge.  Phlebotomy discharge instructions reviewed with pt prior to discharge and he has no questions at this time.  Interpreter at chairside.

## 2014-03-21 NOTE — Patient Instructions (Signed)

## 2014-04-04 ENCOUNTER — Ambulatory Visit (HOSPITAL_BASED_OUTPATIENT_CLINIC_OR_DEPARTMENT_OTHER): Payer: Medicaid Other

## 2014-04-04 ENCOUNTER — Other Ambulatory Visit (HOSPITAL_BASED_OUTPATIENT_CLINIC_OR_DEPARTMENT_OTHER): Payer: Medicaid Other

## 2014-04-04 DIAGNOSIS — D751 Secondary polycythemia: Secondary | ICD-10-CM

## 2014-04-04 LAB — CBC WITH DIFFERENTIAL/PLATELET
BASO%: 0.9 % (ref 0.0–2.0)
Basophils Absolute: 0.1 10*3/uL (ref 0.0–0.1)
EOS%: 5.1 % (ref 0.0–7.0)
Eosinophils Absolute: 0.3 10*3/uL (ref 0.0–0.5)
HCT: 46.2 % (ref 38.4–49.9)
HEMOGLOBIN: 15.2 g/dL (ref 13.0–17.1)
LYMPH%: 32.5 % (ref 14.0–49.0)
MCH: 27.3 pg (ref 27.2–33.4)
MCHC: 32.9 g/dL (ref 32.0–36.0)
MCV: 82.9 fL (ref 79.3–98.0)
MONO#: 0.4 10*3/uL (ref 0.1–0.9)
MONO%: 6.4 % (ref 0.0–14.0)
NEUT#: 3.7 10*3/uL (ref 1.5–6.5)
NEUT%: 55.1 % (ref 39.0–75.0)
PLATELETS: 243 10*3/uL (ref 140–400)
RBC: 5.57 10*6/uL (ref 4.20–5.82)
RDW: 13.3 % (ref 11.0–14.6)
WBC: 6.7 10*3/uL (ref 4.0–10.3)
lymph#: 2.2 10*3/uL (ref 0.9–3.3)

## 2014-04-04 NOTE — Patient Instructions (Signed)

## 2014-04-17 ENCOUNTER — Other Ambulatory Visit: Payer: Self-pay | Admitting: *Deleted

## 2014-04-18 ENCOUNTER — Ambulatory Visit (HOSPITAL_BASED_OUTPATIENT_CLINIC_OR_DEPARTMENT_OTHER): Payer: Medicaid Other

## 2014-04-18 ENCOUNTER — Other Ambulatory Visit (HOSPITAL_BASED_OUTPATIENT_CLINIC_OR_DEPARTMENT_OTHER): Payer: Medicaid Other

## 2014-04-18 DIAGNOSIS — D751 Secondary polycythemia: Secondary | ICD-10-CM

## 2014-04-18 LAB — CBC WITH DIFFERENTIAL/PLATELET
BASO%: 1.1 % (ref 0.0–2.0)
BASOS ABS: 0.1 10*3/uL (ref 0.0–0.1)
EOS%: 6.4 % (ref 0.0–7.0)
Eosinophils Absolute: 0.4 10*3/uL (ref 0.0–0.5)
HEMATOCRIT: 46.2 % (ref 38.4–49.9)
HGB: 14.5 g/dL (ref 13.0–17.1)
LYMPH%: 29.8 % (ref 14.0–49.0)
MCH: 24.9 pg — AB (ref 27.2–33.4)
MCHC: 31.4 g/dL — ABNORMAL LOW (ref 32.0–36.0)
MCV: 79.3 fL (ref 79.3–98.0)
MONO#: 0.4 10*3/uL (ref 0.1–0.9)
MONO%: 7 % (ref 0.0–14.0)
NEUT#: 3.5 10*3/uL (ref 1.5–6.5)
NEUT%: 55.7 % (ref 39.0–75.0)
Platelets: 270 10*3/uL (ref 140–400)
RBC: 5.83 10*6/uL — ABNORMAL HIGH (ref 4.20–5.82)
RDW: 15.4 % — AB (ref 11.0–14.6)
WBC: 6.3 10*3/uL (ref 4.0–10.3)
lymph#: 1.9 10*3/uL (ref 0.9–3.3)

## 2014-04-18 NOTE — Patient Instructions (Signed)

## 2014-04-18 NOTE — Progress Notes (Signed)
Phlebotomy performed with 16G phlebotomy set to right [facility] without difficulty from 1357-1405. Patient tolerated well. Observed 25 mins, then patient was ready to go. VS stable. No complaints at discharge. Interpreter present during appt.

## 2014-04-24 ENCOUNTER — Other Ambulatory Visit (HOSPITAL_BASED_OUTPATIENT_CLINIC_OR_DEPARTMENT_OTHER): Payer: Medicaid Other

## 2014-04-24 ENCOUNTER — Telehealth: Payer: Self-pay | Admitting: *Deleted

## 2014-04-24 ENCOUNTER — Telehealth: Payer: Self-pay | Admitting: Hematology

## 2014-04-24 ENCOUNTER — Encounter: Payer: Self-pay | Admitting: Hematology

## 2014-04-24 ENCOUNTER — Ambulatory Visit (HOSPITAL_BASED_OUTPATIENT_CLINIC_OR_DEPARTMENT_OTHER): Payer: Medicaid Other | Admitting: Hematology

## 2014-04-24 VITALS — BP 104/61 | HR 99 | Temp 98.3°F | Resp 18 | Ht 67.0 in | Wt 207.1 lb

## 2014-04-24 DIAGNOSIS — Z720 Tobacco use: Secondary | ICD-10-CM

## 2014-04-24 DIAGNOSIS — E785 Hyperlipidemia, unspecified: Secondary | ICD-10-CM | POA: Diagnosis not present

## 2014-04-24 DIAGNOSIS — E119 Type 2 diabetes mellitus without complications: Secondary | ICD-10-CM

## 2014-04-24 DIAGNOSIS — D751 Secondary polycythemia: Secondary | ICD-10-CM

## 2014-04-24 DIAGNOSIS — I2510 Atherosclerotic heart disease of native coronary artery without angina pectoris: Secondary | ICD-10-CM | POA: Diagnosis not present

## 2014-04-24 DIAGNOSIS — I10 Essential (primary) hypertension: Secondary | ICD-10-CM

## 2014-04-24 LAB — CBC WITH DIFFERENTIAL/PLATELET
BASO%: 1.1 % (ref 0.0–2.0)
BASOS ABS: 0.1 10*3/uL (ref 0.0–0.1)
EOS%: 7.5 % — AB (ref 0.0–7.0)
Eosinophils Absolute: 0.5 10*3/uL (ref 0.0–0.5)
HCT: 41.9 % (ref 38.4–49.9)
HEMOGLOBIN: 13.2 g/dL (ref 13.0–17.1)
LYMPH%: 28.7 % (ref 14.0–49.0)
MCH: 24.3 pg — AB (ref 27.2–33.4)
MCHC: 31.5 g/dL — ABNORMAL LOW (ref 32.0–36.0)
MCV: 77.2 fL — ABNORMAL LOW (ref 79.3–98.0)
MONO#: 0.5 10*3/uL (ref 0.1–0.9)
MONO%: 7.5 % (ref 0.0–14.0)
NEUT%: 55.2 % (ref 39.0–75.0)
NEUTROS ABS: 3.6 10*3/uL (ref 1.5–6.5)
PLATELETS: 289 10*3/uL (ref 140–400)
RBC: 5.43 10*6/uL (ref 4.20–5.82)
RDW: 15.6 % — AB (ref 11.0–14.6)
WBC: 6.5 10*3/uL (ref 4.0–10.3)
lymph#: 1.9 10*3/uL (ref 0.9–3.3)

## 2014-04-24 NOTE — Telephone Encounter (Signed)
Gave avs & calendar April thru July. Sent message to schedule phlebotomy.

## 2014-04-24 NOTE — Progress Notes (Signed)
[place], [place] [address]  DIAGNOSIS: Erythrocytosis  Polycythemia, secondary, likely secondary to smoking  Chief Complaint  Patient presents with  . Follow-up    erythrocytosis    CURRENT THERAPY: Phlebotomy to maintain hct less than 45%.    INTERVAL HISTORY: Keith Flores 54 y.o. male  who is here for further follow up for erythrocytosis.  He was last seen by Dr. Juliann Mule 6 month ago, and has not been followed due to his travel. Of note, he has an extensive cardiac history including CAD s/p CABG in 2010, Hypertension and hyperlipidemia for which he is being managed by cardiology. He had a lexiscan myoview recently due to test discomfort which was revealed the following: Low risk stress nuclear study with a moderate size, medium intensity, partially reversible inferior defect consistent with inferior thinning vs prior infarct and mild inferior ischemia. LV Ejection Fraction: 52%. LV Wall Motion: NL LV Function; NL Wall Motion. He was last seen by Dr. Sherren Mocha 12/2012.   He denies being told he had an elevated hemoglobin on prior CBCs. Looking through his lab values, he has had significant fluctuations with his hct being 53.6 to as low as 39 on 12/30/2010 to as high as 57.7 on 01/10/2013. He also denied feeling very itchy after taking a shower or bathing. He denied burning pain in his feet or cyanosis or discolorations. He also denied chest and abdominal pain, myalgia and weakness, fatigue, headache, blurred vision, transient loss of vision, paresthesias, slow mentation. He denied a history of blood clots or gout. He smoked about 6 cigarettes daily and smoked for many years prior. He denied any dysuria or hematuria or weight changes. He denied living in high altitudes or the use of testosterone.   INTERIM HISTORY [name] returns for follow-up, he is accompanied by an interpreter who is also his  friend and neighbor.  He is feeling well overall. He has been doing phlebotomy every 2 weeks and tolerated well. No other complains.    MEDICAL HISTORY: Past Medical History  Diagnosis Date  . Hypertension   . Diabetes mellitus   . Asthma   . Hyperlipidemia   . Coronary artery disease     CABG 12/12 (LIMA-LAD, SVG-DX, SVG-OM, SVG-PDA) c/b post op AFib Rx with amio Rx. s/p successful PCI of the right coronary artery utilizing overlapping drug-eluting stents in the mid and distal vessel (done after abnormal nuc)  . Chest pain     01/2011 in setting of HTN urgency: Chest CT demonstrated no pulmonary embolus, postoperative appearance following CABG and small left pleural effusion and dependent atelectasis in the left lung  . Elevated hemoglobin      ALLERGIES:  has No Known Allergies.  MEDICATIONS: has a current medication list which includes the following prescription(s): amlodipine, aspirin, carvedilol, clopidogrel, enalapril, glimepiride, metformin, multivitamin-iron-minerals-folic acid, nitroglycerin, and pravastatin.  SURGICAL HISTORY:  Past Surgical History  Procedure Laterality Date  . Cardiac catheterization    . Coronary artery bypass graft  12/30/2010    Procedure: CORONARY ARTERY BYPASS GRAFTING (CABG);  Surgeon: Grace Isaac, MD;  Location: [place];  Service: Open Heart Surgery;  Laterality: N/A;  . Left heart catheterization with coronary/graft angiogram  03/07/2011    Procedure: LEFT HEART CATHETERIZATION WITH Beatrix Fetters;  Surgeon: Sherren Mocha, MD;  Location: [facility] CATH LAB;  Service: Cardiovascular;;  . Percutaneous coronary stent intervention (pci-s)  03/07/2011  Procedure: PERCUTANEOUS CORONARY STENT INTERVENTION (PCI-S);  Surgeon: Sherren Mocha, MD;  Location: [facility] CATH LAB;  Service: Cardiovascular;;  . Left heart catheterization with coronary angiogram N/A 01/12/2013    Procedure: LEFT HEART CATHETERIZATION WITH CORONARY ANGIOGRAM;  Surgeon: Blane Ohara, MD;  Location: [facility] CATH LAB;  Service: Cardiovascular;  Laterality: N/A;    REVIEW OF SYSTEMS:   Constitutional: Denies fevers, chills or abnormal weight loss Eyes: Denies blurriness of vision Ears, nose, mouth, throat, and face: Denies mucositis or sore throat Respiratory: Denies cough, dyspnea or wheezes Cardiovascular: Denies palpitation, chest discomfort or lower extremity swelling Gastrointestinal:  Denies nausea, heartburn or change in bowel habits Skin: Denies abnormal skin rashes Lymphatics: Denies new lymphadenopathy or easy bruising Neurological:Denies numbness, tingling or new weaknesses Behavioral/Psych: Mood is stable, no new changes  All other systems were reviewed with the patient and are negative.  PHYSICAL EXAMINATION: ECOG PERFORMANCE STATUS: 0 - Asymptomatic  Blood pressure 104/61, pulse 99, temperature 98.3 F (36.8 C), temperature source Oral, resp. rate 18, height _0  (1.702 m), weight 207 lb 1.6 oz (93.94 kg), SpO2 98 %.  GENERAL:alert, no distress and comfortable; moderately obese SKIN: skin color, texture, turgor are normal, no rashes or significant lesions EYES: normal, Conjunctiva are pink and non-injected, sclera clear OROPHARYNX:no exudate, no erythema and lips, buccal mucosa, and tongue normal  NECK: supple, thyroid normal size, non-tender, without nodularity LYMPH:  no palpable lymphadenopathy in the cervical, axillary or supraclavicular LUNGS: clear to auscultation and percussion with normal breathing effort HEART: regular rate & rhythm and no murmurs and no lower extremity edema ABDOMEN:abdomen soft, non-tender and normal bowel sounds Musculoskeletal:no cyanosis of digits and no clubbing  NEURO: alert & oriented x 3 with fluent speech, no focal motor/sensory deficits  LABORATORY DATA: CBC Latest Ref Rng 04/24/2014 04/18/2014 04/04/2014  WBC 4.0 - 10.3 10e3/uL 6.5 6.3 6.7  Hemoglobin 13.0 - 17.1 g/dL 13.2 14.5 15.2  Hematocrit 38.4 - 49.9 %  41.9 46.2 46.2  Platelets 140 - 400 10e3/uL 289 270 243    CMP Latest Ref Rng 03/21/2014 01/18/2014 05/23/2013  Glucose 70 - 140 mg/dl 133 144(H) 228(H)  BUN 7.0 - 26.0 mg/dL 18.5 16.1 17.3  Creatinine 0.7 - 1.3 mg/dL 1.0 1.0 1.1  Sodium 136 - 145 mEq/L 140 139 141  Potassium 3.5 - 5.1 mEq/L 4.0 4.0 4.2  Chloride 96 - 112 mEq/L - - -  CO2 22 - 29 mEq/L _1 Calcium 8.4 - 10.4 mg/dL 9.7 9.5 9.3  Total Protein 6.4 - 8.3 g/dL - 7.4 -  Total Bilirubin 0.20 - 1.20 mg/dL - 1.47(H) -  Alkaline Phos 40 - 150 U/L - 69 -  AST 5 - 34 U/L - 19 -  ALT 0 - 55 U/L - 35 -   Erythropoietin  Status: Finalresult Visible to patient:  Not Released Nextappt: 02/01/2014 at 09:00 AM in No [facility] Room) Dx:  Polycythemia, secondary         Ref Range 59yrago    Erythropoietin 2.6 - 18.5 mIU/mL 13.7      PATHOLOGY REPORT 02/01/2014 Diagnosis Bone Marrow, Aspirate,Biopsy, and Clot, left iliac crest - SLIGHTLY HYPERCELLULAR BONE MARROW FOR AGE WITH TRILINEAGE HEMATOPOIESIS. - SEE COMMENT. PERIPHERAL BLOOD: - ERYTHROCYTOSIS. Diagnosis Note The overall findings are not considered specific or diagnostic of a myeloproliferative neoplasm. Correlation with cytogenetic and JAK-2 mutation analysis is recommended.  Cytogenetics: normal   RADIOGRAPHIC STUDIES: 05/20/2013 CT ABDOMEN WITH CONTRAST TECHNIQUE:  Multidetector  CT imaging of the abdomen was performed using the standard protocol following bolus administration of intravenous contrast. CONTRAST: 152m OMNIPAQUE IOHEXOL 300 MG/ML SOLN  COMPARISON: 04/12/2006 FINDINGS: The lung bases appear clear. No pleural or pericardial effusion.  No focal liver abnormality identified. The gallbladder is normal. There is no biliary dilatation. The spleen is unremarkable measuring 10.2 cm in length. The adrenal glands are both normal. The right kidney is normal. Cyst within the upper pole of the left kidney is identified measuring 1.2 cm.  Normal caliber of the abdominal aorta. No aneurysm. There is no upper abdominal adenopathy identified. The stomach is normal. The small bowel loops are unremarkable. Normal appearance of the colon. Review of the visualized osseous structures is significant for mild spondylosis. IMPRESSION: 1. No acute findings identified within the upper abdomen. 2. No mass or adenopathy and no evidence for splenomegaly.    ASSESSMENT:  Jeanpierre SAntonieta Pertis a 54y.o. male with a history of CAD/CABG presents with persistence of elevated (H/H) without thrombocytosis, leukocytosis. He also has an elevated vitamin b12. CT of Abdomen (2008) was negative for splenomegaly. JAK2 was negative. Normal EPO level. He denies classic aquagenic pruritus, negative for erythromegalalia. Secondary causes due [place]/tester[place] are felt to be less likely, but he dose have extensive smoking history.  1.  Polycythemia, secondary to smoking  --He only needs one major WHO criteria for P vera, and does not meet any of the minor criteria. I reviewed his bone marrow biopsy with him, which does not support myeloproliferative disorder. JAK2 mutation was negative, his erythropoietin level was normal.  -I think his polycythemia is probable secondary, likely related to his extensive smoking history., Although he has never been hypoxic. I also suggest him to have a sleep study to ruled out sleep apnea. -Given his prior extensive history of coronary artery disease, I recommend continue phlebotomy to keep his hematocrit less than 45%. I do not recommend Hydrea.  -His H&H has much improved, hematocrit 42% today no need for phlebotomy. -- I reemphasized the importance of compliance with his daily baby aspirin (81 mg) daily.   2. CAD -continue follow up with his cardiologist   3. HTN, DM, hyperlipidemia -follow up with PCP    PLAN: 1. CBC monthly and Phlebotomy if hematocrit above 45% 2. Return to clinic in 3 months for  follow-up.   All questions were answered. The patient knows to call the clinic with any problems, questions or concerns. We can certainly see the patient much sooner if necessary.  I spent 20 minutes counseling the patient face to face in the presence of an arabic intepreter. The total time spent in the appointment was 25 minutes.    FTruitt Merle MD 04/24/2014

## 2014-04-24 NOTE — Telephone Encounter (Signed)
Per staff message and POF I have scheduled appts. Advised scheduler of appts. JMW  

## 2014-04-30 ENCOUNTER — Encounter: Payer: Self-pay | Admitting: Hematology

## 2014-04-30 DIAGNOSIS — D751 Secondary polycythemia: Secondary | ICD-10-CM | POA: Insufficient documentation

## 2014-05-08 ENCOUNTER — Other Ambulatory Visit (HOSPITAL_BASED_OUTPATIENT_CLINIC_OR_DEPARTMENT_OTHER): Payer: Medicaid Other

## 2014-05-08 ENCOUNTER — Ambulatory Visit: Payer: Medicaid Other

## 2014-05-08 DIAGNOSIS — D751 Secondary polycythemia: Secondary | ICD-10-CM

## 2014-05-08 LAB — CBC WITH DIFFERENTIAL/PLATELET
BASO%: 1.3 % (ref 0.0–2.0)
BASOS ABS: 0.1 10*3/uL (ref 0.0–0.1)
EOS%: 6.2 % (ref 0.0–7.0)
Eosinophils Absolute: 0.4 10*3/uL (ref 0.0–0.5)
HEMATOCRIT: 42.1 % (ref 38.4–49.9)
HGB: 13 g/dL (ref 13.0–17.1)
LYMPH#: 1.8 10*3/uL (ref 0.9–3.3)
LYMPH%: 31.2 % (ref 14.0–49.0)
MCH: 23 pg — ABNORMAL LOW (ref 27.2–33.4)
MCHC: 31 g/dL — AB (ref 32.0–36.0)
MCV: 74.3 fL — ABNORMAL LOW (ref 79.3–98.0)
MONO#: 0.5 10*3/uL (ref 0.1–0.9)
MONO%: 8.3 % (ref 0.0–14.0)
NEUT%: 53 % (ref 39.0–75.0)
NEUTROS ABS: 3 10*3/uL (ref 1.5–6.5)
Platelets: 268 10*3/uL (ref 140–400)
RBC: 5.67 10*6/uL (ref 4.20–5.82)
RDW: 16.6 % — AB (ref 11.0–14.6)
WBC: 5.7 10*3/uL (ref 4.0–10.3)

## 2014-05-08 NOTE — Progress Notes (Signed)
Spoke to patient and interpreter in lobby.  HCT 42.1 today, no phlebotomy necessary per office note.  Pt and interpreter verbalized understanding.  Pt given copy of labs and instructed to return as per appointment schedule.

## 2014-06-05 ENCOUNTER — Other Ambulatory Visit (HOSPITAL_BASED_OUTPATIENT_CLINIC_OR_DEPARTMENT_OTHER): Payer: Medicaid Other

## 2014-06-05 ENCOUNTER — Ambulatory Visit (HOSPITAL_BASED_OUTPATIENT_CLINIC_OR_DEPARTMENT_OTHER): Payer: Medicaid Other

## 2014-06-05 VITALS — BP 103/63 | HR 93 | Temp 97.9°F | Resp 20

## 2014-06-05 DIAGNOSIS — D751 Secondary polycythemia: Secondary | ICD-10-CM

## 2014-06-05 LAB — CBC WITH DIFFERENTIAL/PLATELET
BASO%: 1 % (ref 0.0–2.0)
Basophils Absolute: 0.1 10*3/uL (ref 0.0–0.1)
EOS%: 4.9 % (ref 0.0–7.0)
Eosinophils Absolute: 0.3 10*3/uL (ref 0.0–0.5)
HEMATOCRIT: 46 % (ref 38.4–49.9)
HGB: 14.3 g/dL (ref 13.0–17.1)
LYMPH%: 27.1 % (ref 14.0–49.0)
MCH: 22.1 pg — ABNORMAL LOW (ref 27.2–33.4)
MCHC: 31.1 g/dL — ABNORMAL LOW (ref 32.0–36.0)
MCV: 71 fL — ABNORMAL LOW (ref 79.3–98.0)
MONO#: 0.5 10*3/uL (ref 0.1–0.9)
MONO%: 7.2 % (ref 0.0–14.0)
NEUT%: 59.8 % (ref 39.0–75.0)
NEUTROS ABS: 4 10*3/uL (ref 1.5–6.5)
Platelets: 255 10*3/uL (ref 140–400)
RBC: 6.48 10*6/uL — ABNORMAL HIGH (ref 4.20–5.82)
RDW: 17.4 % — ABNORMAL HIGH (ref 11.0–14.6)
WBC: 6.7 10*3/uL (ref 4.0–10.3)
lymph#: 1.8 10*3/uL (ref 0.9–3.3)

## 2014-06-05 NOTE — Patient Instructions (Signed)

## 2014-06-05 NOTE — Progress Notes (Signed)
Phlebotomy started at 1430 and ended at 1436. 500 cc obtained. Patient tolerated well. Nourishment provided.

## 2014-07-03 ENCOUNTER — Other Ambulatory Visit (HOSPITAL_BASED_OUTPATIENT_CLINIC_OR_DEPARTMENT_OTHER): Payer: Medicaid Other

## 2014-07-03 ENCOUNTER — Other Ambulatory Visit: Payer: Self-pay | Admitting: *Deleted

## 2014-07-03 ENCOUNTER — Ambulatory Visit (HOSPITAL_BASED_OUTPATIENT_CLINIC_OR_DEPARTMENT_OTHER): Payer: Medicaid Other

## 2014-07-03 DIAGNOSIS — D751 Secondary polycythemia: Secondary | ICD-10-CM

## 2014-07-03 LAB — CBC WITH DIFFERENTIAL/PLATELET
BASO%: 1.1 % (ref 0.0–2.0)
Basophils Absolute: 0.1 10*3/uL (ref 0.0–0.1)
EOS%: 8.5 % — AB (ref 0.0–7.0)
Eosinophils Absolute: 0.6 10*3/uL — ABNORMAL HIGH (ref 0.0–0.5)
HCT: 46.4 % (ref 38.4–49.9)
HGB: 14.4 g/dL (ref 13.0–17.1)
LYMPH%: 36.7 % (ref 14.0–49.0)
MCH: 22.2 pg — AB (ref 27.2–33.4)
MCHC: 31 g/dL — ABNORMAL LOW (ref 32.0–36.0)
MCV: 71.4 fL — AB (ref 79.3–98.0)
MONO#: 0.5 10*3/uL (ref 0.1–0.9)
MONO%: 7.9 % (ref 0.0–14.0)
NEUT%: 45.8 % (ref 39.0–75.0)
NEUTROS ABS: 3 10*3/uL (ref 1.5–6.5)
NRBC: 0 % (ref 0–0)
Platelets: 213 10*3/uL (ref 140–400)
RBC: 6.5 10*6/uL — ABNORMAL HIGH (ref 4.20–5.82)
RDW: 17.1 % — AB (ref 11.0–14.6)
WBC: 6.5 10*3/uL (ref 4.0–10.3)
lymph#: 2.4 10*3/uL (ref 0.9–3.3)

## 2014-07-03 MED ORDER — SODIUM CHLORIDE 0.9 % IV SOLN
250.0000 mL | INTRAVENOUS | Status: AC
  Administered 2014-07-03: 500 mL via INTRAVENOUS

## 2014-07-03 NOTE — Patient Instructions (Signed)

## 2014-07-05 ENCOUNTER — Telehealth: Payer: Self-pay | Admitting: Hematology

## 2014-07-05 NOTE — Telephone Encounter (Signed)
per Burr Medico to r/s pt appt per Call Day-sent MW emaill to move to coordinate MD appt-will call pt after reply

## 2014-07-06 ENCOUNTER — Telehealth: Payer: Self-pay | Admitting: *Deleted

## 2014-07-06 NOTE — Telephone Encounter (Signed)
I have adjusted 7/11 appt

## 2014-07-07 ENCOUNTER — Telehealth: Payer: Self-pay | Admitting: Hematology

## 2014-07-07 NOTE — Telephone Encounter (Signed)
per pof to sch appt-cld & spoke to pt and gave pt time & date-pt understood

## 2014-07-31 ENCOUNTER — Other Ambulatory Visit (HOSPITAL_BASED_OUTPATIENT_CLINIC_OR_DEPARTMENT_OTHER): Payer: Medicaid Other

## 2014-07-31 ENCOUNTER — Other Ambulatory Visit: Payer: Medicaid Other

## 2014-07-31 ENCOUNTER — Telehealth: Payer: Self-pay | Admitting: Hematology

## 2014-07-31 ENCOUNTER — Ambulatory Visit (HOSPITAL_BASED_OUTPATIENT_CLINIC_OR_DEPARTMENT_OTHER): Payer: Medicaid Other | Admitting: Hematology

## 2014-07-31 ENCOUNTER — Telehealth: Payer: Self-pay | Admitting: *Deleted

## 2014-07-31 ENCOUNTER — Ambulatory Visit: Payer: Medicaid Other | Admitting: Hematology

## 2014-07-31 VITALS — BP 120/66 | HR 79 | Temp 98.6°F | Resp 18 | Ht 67.0 in | Wt 206.7 lb

## 2014-07-31 DIAGNOSIS — E119 Type 2 diabetes mellitus without complications: Secondary | ICD-10-CM

## 2014-07-31 DIAGNOSIS — J40 Bronchitis, not specified as acute or chronic: Secondary | ICD-10-CM

## 2014-07-31 DIAGNOSIS — D751 Secondary polycythemia: Secondary | ICD-10-CM

## 2014-07-31 DIAGNOSIS — I2510 Atherosclerotic heart disease of native coronary artery without angina pectoris: Secondary | ICD-10-CM

## 2014-07-31 DIAGNOSIS — Z720 Tobacco use: Secondary | ICD-10-CM

## 2014-07-31 DIAGNOSIS — I10 Essential (primary) hypertension: Secondary | ICD-10-CM

## 2014-07-31 LAB — CBC WITH DIFFERENTIAL/PLATELET
BASO%: 1.1 % (ref 0.0–2.0)
Basophils Absolute: 0.1 10*3/uL (ref 0.0–0.1)
EOS ABS: 0.5 10*3/uL (ref 0.0–0.5)
EOS%: 7.5 % — AB (ref 0.0–7.0)
HCT: 43.6 % (ref 38.4–49.9)
HEMOGLOBIN: 13.3 g/dL (ref 13.0–17.1)
LYMPH%: 22.3 % (ref 14.0–49.0)
MCH: 21.1 pg — ABNORMAL LOW (ref 27.2–33.4)
MCHC: 30.6 g/dL — ABNORMAL LOW (ref 32.0–36.0)
MCV: 69 fL — AB (ref 79.3–98.0)
MONO#: 0.5 10*3/uL (ref 0.1–0.9)
MONO%: 7.7 % (ref 0.0–14.0)
NEUT#: 3.8 10*3/uL (ref 1.5–6.5)
NEUT%: 61.4 % (ref 39.0–75.0)
PLATELETS: 233 10*3/uL (ref 140–400)
RBC: 6.32 10*6/uL — ABNORMAL HIGH (ref 4.20–5.82)
RDW: 18.9 % — AB (ref 11.0–14.6)
WBC: 6.2 10*3/uL (ref 4.0–10.3)
lymph#: 1.4 10*3/uL (ref 0.9–3.3)

## 2014-07-31 MED ORDER — AZITHROMYCIN 250 MG PO TABS: ORAL_TABLET | ORAL | Status: DC

## 2014-07-31 NOTE — Telephone Encounter (Signed)
Pt confirmed labs/ov per 07/11 POF, gave pt AVS and Calendar.... KJ, sent msg to add Phlebotomy

## 2014-07-31 NOTE — Telephone Encounter (Signed)
Per staff message and POF I have scheduled appts. Advised scheduler of appts. JMW  

## 2014-07-31 NOTE — Progress Notes (Signed)
[facility] OFFICE PROGRESS NOTE  [place] Blood, [place] [address] 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   CURRENT THERAPY: Phlebotomy to maintain hct less than 45%.    INTERVAL HISTORY: Keith Flores 54 y.o. male  who is here for further follow up for erythrocytosis.  He was last seen by Dr. Rosie Fate 6 month ago, and has not been followed due to his travel. Of note, he has an extensive cardiac history including CAD s/p CABG in 2010, Hypertension and hyperlipidemia for which he is being managed by cardiology. He had a lexiscan myoview recently due to test discomfort which was revealed the following: Low risk stress nuclear study with a moderate size, medium intensity, partially reversible inferior defect consistent with inferior thinning vs prior infarct and mild inferior ischemia. LV Ejection Fraction: 52%. LV Wall Motion: NL LV Function; NL Wall Motion. He was last seen by Dr. [name] 12/2012.   He denies being told he had an elevated hemoglobin on prior CBCs. Looking through his lab values, he has had significant fluctuations with his hct being 53.6 to as low as 39 on 12/30/2010 to as high as 57.7 on 01/10/2013. He also denied feeling very itchy after taking a shower or bathing. He denied burning pain in his feet or cyanosis or discolorations. He also denied chest and abdominal pain, myalgia and weakness, fatigue, headache, blurred vision, transient loss of vision, paresthesias, slow mentation. He denied a history of blood clots or gout. He smoked about 6 cigarettes daily and smoked for many years prior. He denied any dysuria or hematuria or weight changes. He denied living in high altitudes or the use of testosterone.   INTERIM HISTORY Keith Flores returns for follow-up, he is accompanied by an interpreter who is also his friend  and neighbor.  He is clinically doing well. He has had phlebotomy once a month in the past 3 months. He tolerated well. He is scheduled to have a sleep study in a few weeks. He denies any new medical issues since his last visit. No pain, leg swollen, dyspnea, skin itchiness or other symptoms.    MEDICAL HISTORY: Past Medical History  Diagnosis Date  . Hypertension   . Diabetes mellitus   . Asthma   . Hyperlipidemia   . Coronary artery disease     CABG 12/12 (LIMA-LAD, SVG-DX, SVG-OM, SVG-PDA) c/b post op AFib Rx with amio Rx. s/p successful PCI of the right coronary artery utilizing overlapping drug-eluting stents in the mid and distal vessel (done after abnormal nuc)  . Chest pain     01/2011 in setting of HTN urgency: Chest CT demonstrated no pulmonary embolus, postoperative appearance following CABG and small left pleural effusion and dependent atelectasis in the left lung  . Elevated hemoglobin      ALLERGIES:  has No Known Allergies.  MEDICATIONS: has a current medication list which includes the following prescription(s): amlodipine, aspirin, carvedilol, clopidogrel, enalapril, glimepiride, metformin, multivitamin-iron-minerals-folic acid, nitroglycerin, and pravastatin, and the following Facility-Administered Medications: sodium chloride.  SURGICAL HISTORY:  Past Surgical History  Procedure Laterality Date  . Cardiac catheterization    . Coronary artery bypass graft  12/30/2010    Procedure: CORONARY ARTERY BYPASS GRAFTING (CABG);  Surgeon: Delight Ovens, MD;  Location[facility] OR;  Service: Open Heart Surgery;  Laterality: N/A;  . Left heart catheterization with coronary/graft angiogram  03/07/2011    Procedure: LEFT HEART CATHETERIZATION WITH Beatrix Fetters;  Surgeon: Sherren Mocha, MD;  Location: [facility] CATH LAB;  Service: Cardiovascular;;  . Percutaneous coronary stent intervention (pci-s)  03/07/2011    Procedure: PERCUTANEOUS CORONARY STENT INTERVENTION (PCI-S);   Surgeon: Sherren Mocha, MD;  Location: [facility] CATH LAB;  Service: Cardiovascular;;  . Left heart catheterization with coronary angiogram N/A 01/12/2013    Procedure: LEFT HEART CATHETERIZATION WITH CORONARY ANGIOGRAM;  Surgeon: Blane Ohara, MD;  Location: [facility] CATH LAB;  Service: Cardiovascular;  Laterality: N/A;    REVIEW OF SYSTEMS:   Constitutional: Denies fevers, chills or abnormal weight loss Eyes: Denies blurriness of vision Ears, nose, mouth, throat, and face: Denies mucositis or sore throat Respiratory: Denies cough, dyspnea or wheezes Cardiovascular: Denies palpitation, chest discomfort or lower extremity swelling Gastrointestinal:  Denies nausea, heartburn or change in bowel habits Skin: Denies abnormal skin rashes Lymphatics: Denies new lymphadenopathy or easy bruising Neurological:Denies numbness, tingling or new weaknesses Behavioral/Psych: Mood is stable, no new changes  All other systems were reviewed with the patient and are negative.  PHYSICAL EXAMINATION: ECOG PERFORMANCE STATUS: 0 - Asymptomatic  Blood pressure 120/66, pulse 79, temperature 98.6 F (37 C), temperature source Oral, resp. rate 18, height $RemoveBe'5\' 7"'[id number]$  (1.702 m), weight 206 lb 11.2 oz (93.759 kg), SpO2 98 %.  GENERAL:alert, no distress and comfortable; moderately obese SKIN: skin color, texture, turgor are normal, no rashes or significant lesions EYES: normal, Conjunctiva are pink and non-injected, sclera clear OROPHARYNX:no exudate, no erythema and lips, buccal mucosa, and tongue normal  NECK: supple, thyroid normal size, non-tender, without nodularity LYMPH:  no palpable lymphadenopathy in the cervical, axillary or supraclavicular LUNGS: clear to auscultation and percussion with normal breathing effort HEART: regular rate & rhythm and no murmurs and no lower extremity edema ABDOMEN:abdomen soft, non-tender and normal bowel sounds Musculoskeletal:no cyanosis of digits and no clubbing  NEURO: alert &  oriented x 3 with fluent speech, no focal motor/sensory deficits  LABORATORY DATA: CBC Latest Ref Rng 07/31/2014 07/03/2014 06/05/2014  WBC 4.0 - 10.3 10e3/uL 6.2 6.5 6.7  Hemoglobin 13.0 - 17.1 g/dL 13.3 14.4 14.3  Hematocrit 38.4 - 49.9 % 43.6 46.4 46.0  Platelets 140 - 400 10e3/uL 233 213 255    CMP Latest Ref Rng 03/21/2014 01/18/2014 05/23/2013  Glucose 70 - 140 mg/dl 133 144(H) 228(H)  BUN 7.0 - 26.0 mg/dL 18.5 16.1 17.3  Creatinine 0.7 - 1.3 mg/dL 1.0 1.0 1.1  Sodium 136 - 145 mEq/L 140 139 141  Potassium 3.5 - 5.1 mEq/L 4.0 4.0 4.2  Chloride 96 - 112 mEq/L - - -  CO2 22 - 29 mEq/L $Remove'25 29 25  'VIKoOBm$ Calcium 8.4 - 10.4 mg/dL 9.7 9.5 9.3  Total Protein 6.4 - 8.3 g/dL - 7.4 -  Total Bilirubin 0.20 - 1.20 mg/dL - 1.47(H) -  Alkaline Phos 40 - 150 U/L - 69 -  AST 5 - 34 U/L - 19 -  ALT 0 - 55 U/L - 35 -   Erythropoietin  Status: Finalresult Visible to patient:  Not Released Nextappt: 02/01/2014 at 09:00 AM in No [facility] Room) Dx:  Polycythemia, secondary         Ref Range 8yr ago    Erythropoietin 2.6 - 18.5 mIU/mL 13.7      PATHOLOGY REPORT 02/01/2014 Diagnosis Bone Marrow, Aspirate,Biopsy, and Clot, left iliac crest - SLIGHTLY HYPERCELLULAR BONE MARROW FOR AGE WITH TRILINEAGE HEMATOPOIESIS. - SEE COMMENT. PERIPHERAL BLOOD: - ERYTHROCYTOSIS. Diagnosis Note The  overall findings are not considered specific or diagnostic of a myeloproliferative neoplasm. Correlation with cytogenetic and JAK-2 mutation analysis is recommended.  Cytogenetics: normal   RADIOGRAPHIC STUDIES: 05/20/2013 CT ABDOMEN WITH CONTRAST TECHNIQUE:  Multidetector CT imaging of the abdomen was performed using the standard protocol following bolus administration of intravenous contrast. CONTRAST: 114mL OMNIPAQUE IOHEXOL 300 MG/ML SOLN  COMPARISON: 04/12/2006 FINDINGS: The lung bases appear clear. No pleural or pericardial effusion.  No focal liver abnormality identified. The  gallbladder is normal. There is no biliary dilatation. The spleen is unremarkable measuring 10.2 cm in length. The adrenal glands are both normal. The right kidney is normal. Cyst within the upper pole of the left kidney is identified measuring 1.2 cm. Normal caliber of the abdominal aorta. No aneurysm. There is no upper abdominal adenopathy identified. The stomach is normal. The small bowel loops are unremarkable. Normal appearance of the colon. Review of the visualized osseous structures is significant for mild spondylosis. IMPRESSION: 1. No acute findings identified within the upper abdomen. 2. No mass or adenopathy and no evidence for splenomegaly.    ASSESSMENT:  Keith Flores is a 54 y.o. male with a history of CAD/CABG presents with persistence of elevated (H/H) without thrombocytosis, leukocytosis. He also has an elevated vitamin b12. CT of Abdomen (2008) was negative for splenomegaly. JAK2 was negative. Normal EPO level. He denies classic aquagenic pruritus, negative for erythromegalalia. Secondary causes due to [place] and steroids/testerone are felt to be less likely, but he dose have extensive smoking history.  1.  Polycythemia, secondary to smoking  --He only needs one major WHO criteria for P vera, and does not meet any of the minor criteria. I reviewed his bone marrow biopsy with him, which does not support myeloproliferative disorder. JAK2 mutation was negative, his erythropoietin level was normal.  -I think his polycythemia is probable secondary to his extensive smoking history., Although he has never been hypoxic. I also suggest him to have a sleep study to ruled out sleep apnea, which is scheduled in a few weeks -Given his prior extensive history of coronary artery disease, I recommend continue phlebotomy to keep his hematocrit less than 45%. I do not recommend Hydrea.  -His H&H has much improved, hematocrit 43.6% today no need for phlebotomy. -- I reemphasized the importance  of compliance with his daily baby aspirin (81 mg) daily.   2. CAD -continue follow up with his cardiologist   3. HTN, DM, hyperlipidemia -follow up with PCP    PLAN: 1. CBC in one month and Phlebotomy if hematocrit above 45%. He is going back to [place] at the end of August and will return in earlier November.  2. Return in mid-November for CBC and phlebotomy   All questions were answered. The patient knows to call the clinic with any problems, questions or concerns. We can certainly see the patient much sooner if necessary.  I spent 20 minutes counseling the patient face to face in the presence of an arabic intepreter. The total time spent in the appointment was 25 minutes.    Truitt Merle, MD 04/24/2014

## 2014-08-01 ENCOUNTER — Ambulatory Visit (HOSPITAL_BASED_OUTPATIENT_CLINIC_OR_DEPARTMENT_OTHER): Payer: Medicaid Other | Attending: Internal Medicine

## 2014-08-01 VITALS — Ht 66.0 in | Wt 205.0 lb

## 2014-08-01 DIAGNOSIS — R0683 Snoring: Secondary | ICD-10-CM | POA: Insufficient documentation

## 2014-08-01 DIAGNOSIS — G4736 Sleep related hypoventilation in conditions classified elsewhere: Secondary | ICD-10-CM | POA: Insufficient documentation

## 2014-08-01 DIAGNOSIS — G4733 Obstructive sleep apnea (adult) (pediatric): Secondary | ICD-10-CM | POA: Diagnosis not present

## 2014-08-01 DIAGNOSIS — I493 Ventricular premature depolarization: Secondary | ICD-10-CM | POA: Diagnosis not present

## 2014-08-03 ENCOUNTER — Ambulatory Visit (HOSPITAL_BASED_OUTPATIENT_CLINIC_OR_DEPARTMENT_OTHER): Payer: Medicaid Other | Admitting: Internal Medicine

## 2014-08-03 ENCOUNTER — Other Ambulatory Visit: Payer: Medicaid Other

## 2014-08-03 ENCOUNTER — Ambulatory Visit: Payer: Medicaid Other | Admitting: Hematology

## 2014-08-03 ENCOUNTER — Encounter: Payer: Self-pay | Admitting: Hematology

## 2014-08-03 DIAGNOSIS — G4733 Obstructive sleep apnea (adult) (pediatric): Secondary | ICD-10-CM | POA: Diagnosis not present

## 2014-08-03 NOTE — Progress Notes (Signed)
   Patient Name: Keith Flores, Keith Flores Study Date: 08/01/2014 Gender: Male D.O.B: November 06, 1960 Age (years): 26 Referring Provider: Elwyn Reach Height (inches): 66 Interpreting Physician: Baird Lyons MD, ABSM Weight (lbs): 205 RPSGT: Madelon Lips BMI: 33 MRN: [id number] Neck Size: 16.25 CLINICAL INFORMATION Sleep Study Type: NPSG  Indication for sleep study: Snoring  Epworth Sleepiness Score: 4/24  SLEEP STUDY TECHNIQUE As per the AASM Manual for the Scoring of Sleep and Associated Events v2.3 (April 2016) with a hypopnea requiring 4% desaturations.  The channels recorded and monitored were frontal, central and occipital EEG, electrooculogram (EOG), submentalis EMG (chin), nasal and oral airflow, thoracic and abdominal wall motion, anterior tibialis EMG, snore microphone, electrocardiogram, and pulse oximetry.  MEDICATIONS Patient's medications include: Charted for review. Medications self-administered by patient during sleep study : No sleep medicine administered.  SLEEP ARCHITECTURE The study was initiated at 10:01:43 PM and ended at 4:14:17 AM.  Sleep onset time was 75.4 minutes and the sleep efficiency was 46.8%. The total sleep time was 174.2 minutes.  Stage REM latency was 176.5 minutes.  The patient spent 21.35% of the night in stage N1 sleep, 59.99% in stage N2 sleep, 0.00% in stage N3 and 18.66% in REM.  Alpha intrusion was absent.  Supine sleep was 4.02%.  Awake after sleep onset 123 minutes  RESPIRATORY PARAMETERS The overall apnea/hypopnea index (AHI) was 10.7 per hour. There were 0 total apneas, including 0 obstructive, 0 central and 0 mixed apneas. There were 31 hypopneas and 0 RERAs.  The AHI during Stage REM sleep was 22.2 per hour.  AHI while supine was 25.7 per hour.  The mean oxygen saturation was 90.71%. The minimum SpO2 during sleep was 83.00%.  Soft snoring was noted during this study.  CARDIAC DATA The 2 lead EKG demonstrated sinus  rhythm. The mean heart rate was 72.66 beats per minute. Other EKG findings include: PVCs.  LEG MOVEMENT DATA The total PLMS were 0 with a resulting PLMS index of 0.00. Associated arousal with leg movement index was 0.0 .  IMPRESSIONS Mild obstructive sleep apnea occurred during this study (AHI = 10.7/h). No significant central sleep apnea occurred during this study (CAI = 0.0/h). Mild oxygen desaturation was noted during this study (Min O2 = 83.00%) with mean saturation 90.7% on room air. The patient snored with Soft snoring volume. EKG findings include PVCs. Clinically significant periodic limb movements did not occur during sleep. No significant associated arousals.  DIAGNOSIS Obstructive Sleep Apnea (327.23 [G47.33 ICD-10]) Nocturnal Hypoxemia (327.26 [G47.36 ICD-10])  RECOMMENDATIONS Conservative measures may be sufficient. On an individual basis, CPAP or an oral appliance would be among possible options as appropriate. Positional therapy avoiding supine position during sleep. Avoid alcohol, sedatives and other CNS depressants that may worsen sleep apnea and disrupt normal sleep architecture. Sleep hygiene should be reviewed to assess factors that may improve sleep quality. Weight management and regular exercise should be initiated or continued if appropriate. MISCELLANEOUS  Deneise Lever Diplomate, American Board of Sleep Medicine  ELECTRONICALLY SIGNED ON:  08/03/2014, 3:38 PM [place] PH: [phone]   FX: [phone] [place]

## 2014-08-31 ENCOUNTER — Other Ambulatory Visit: Payer: Medicaid Other

## 2014-09-12 ENCOUNTER — Other Ambulatory Visit (HOSPITAL_BASED_OUTPATIENT_CLINIC_OR_DEPARTMENT_OTHER): Payer: Medicaid Other

## 2014-09-12 ENCOUNTER — Ambulatory Visit (HOSPITAL_BASED_OUTPATIENT_CLINIC_OR_DEPARTMENT_OTHER): Payer: Medicaid Other

## 2014-09-12 DIAGNOSIS — D751 Secondary polycythemia: Secondary | ICD-10-CM | POA: Diagnosis not present

## 2014-09-12 DIAGNOSIS — E119 Type 2 diabetes mellitus without complications: Secondary | ICD-10-CM | POA: Diagnosis not present

## 2014-09-12 LAB — CBC WITH DIFFERENTIAL/PLATELET
BASO%: 1.1 % (ref 0.0–2.0)
Basophils Absolute: 0.1 10*3/uL (ref 0.0–0.1)
EOS%: 6.3 % (ref 0.0–7.0)
Eosinophils Absolute: 0.4 10*3/uL (ref 0.0–0.5)
HCT: 45.5 % (ref 38.4–49.9)
HGB: 14 g/dL (ref 13.0–17.1)
LYMPH%: 24.9 % (ref 14.0–49.0)
MCH: 21.5 pg — ABNORMAL LOW (ref 27.2–33.4)
MCHC: 30.7 g/dL — ABNORMAL LOW (ref 32.0–36.0)
MCV: 70.1 fL — ABNORMAL LOW (ref 79.3–98.0)
MONO#: 0.5 10*3/uL (ref 0.1–0.9)
MONO%: 7.2 % (ref 0.0–14.0)
NEUT%: 60.5 % (ref 39.0–75.0)
NEUTROS ABS: 3.8 10*3/uL (ref 1.5–6.5)
Platelets: 248 10*3/uL (ref 140–400)
RBC: 6.48 10*6/uL — AB (ref 4.20–5.82)
RDW: 18.9 % — ABNORMAL HIGH (ref 11.0–14.6)
WBC: 6.3 10*3/uL (ref 4.0–10.3)
lymph#: 1.6 10*3/uL (ref 0.9–3.3)

## 2014-09-12 NOTE — Patient Instructions (Signed)

## 2014-12-11 ENCOUNTER — Other Ambulatory Visit: Payer: Self-pay | Admitting: *Deleted

## 2014-12-11 ENCOUNTER — Encounter: Payer: Self-pay | Admitting: Hematology

## 2014-12-11 ENCOUNTER — Encounter: Payer: Medicaid Other | Admitting: Hematology

## 2014-12-11 ENCOUNTER — Other Ambulatory Visit: Payer: Medicaid Other

## 2014-12-11 NOTE — Progress Notes (Signed)
This encounter was created in error - please disregard.

## 2014-12-19 ENCOUNTER — Other Ambulatory Visit: Payer: Self-pay | Admitting: *Deleted

## 2014-12-19 ENCOUNTER — Ambulatory Visit (HOSPITAL_BASED_OUTPATIENT_CLINIC_OR_DEPARTMENT_OTHER): Payer: Medicaid Other | Admitting: Hematology

## 2014-12-19 ENCOUNTER — Other Ambulatory Visit (HOSPITAL_BASED_OUTPATIENT_CLINIC_OR_DEPARTMENT_OTHER): Payer: Medicaid Other

## 2014-12-19 ENCOUNTER — Encounter: Payer: Self-pay | Admitting: Hematology

## 2014-12-19 ENCOUNTER — Telehealth: Payer: Self-pay | Admitting: Hematology

## 2014-12-19 VITALS — BP 132/77 | HR 97 | Temp 98.2°F | Resp 17 | Ht 66.0 in | Wt 209.5 lb

## 2014-12-19 DIAGNOSIS — D751 Secondary polycythemia: Secondary | ICD-10-CM

## 2014-12-19 LAB — CBC WITH DIFFERENTIAL/PLATELET
BASO%: 0.9 % (ref 0.0–2.0)
BASOS ABS: 0.1 10*3/uL (ref 0.0–0.1)
EOS%: 3 % (ref 0.0–7.0)
Eosinophils Absolute: 0.2 10*3/uL (ref 0.0–0.5)
HEMATOCRIT: 46.3 % (ref 38.4–49.9)
HGB: 14.3 g/dL (ref 13.0–17.1)
LYMPH#: 1.7 10*3/uL (ref 0.9–3.3)
LYMPH%: 28.6 % (ref 14.0–49.0)
MCH: 21.5 pg — AB (ref 27.2–33.4)
MCHC: 30.8 g/dL — AB (ref 32.0–36.0)
MCV: 69.8 fL — ABNORMAL LOW (ref 79.3–98.0)
MONO#: 0.4 10*3/uL (ref 0.1–0.9)
MONO%: 6.7 % (ref 0.0–14.0)
NEUT#: 3.6 10*3/uL (ref 1.5–6.5)
NEUT%: 60.8 % (ref 39.0–75.0)
Platelets: 241 10*3/uL (ref 140–400)
RBC: 6.62 10*6/uL — ABNORMAL HIGH (ref 4.20–5.82)
RDW: 19.6 % — ABNORMAL HIGH (ref 11.0–14.6)
WBC: 5.9 10*3/uL (ref 4.0–10.3)

## 2014-12-19 NOTE — Progress Notes (Signed)
[facility] OFFICE PROGRESS NOTE  [name], MD [address] 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   CURRENT THERAPY: Phlebotomy to maintain hct less than 45%.    INTERVAL HISTORY: Keith Flores 54 y.o. male  who is here for further follow up for erythrocytosis.  He was last seen by Dr. Juliann Mule 6 month ago, and has not been followed due to his travel. Of note, he has an extensive cardiac history including CAD s/p CABG in 2010, Hypertension and hyperlipidemia for which he is being managed by cardiology. He had a lexiscan myoview recently due to test discomfort which was revealed the following: Low risk stress nuclear study with a moderate size, medium intensity, partially reversible inferior defect consistent with inferior thinning vs prior infarct and mild inferior ischemia. LV Ejection Fraction: 52%. LV Wall Motion: NL LV Function; NL Wall Motion. He was last seen by Dr. Sherren Mocha 12/2012.   He denies being told he had an elevated hemoglobin on prior CBCs. Looking through his lab values, he has had significant fluctuations with his hct being 53.6 to as low as 39 on 12/30/2010 to as high as 57.7 on 01/10/2013. He also denied feeling very itchy after taking a shower or bathing. He denied burning pain in his feet or cyanosis or discolorations. He also denied chest and abdominal pain, myalgia and weakness, fatigue, headache, blurred vision, transient loss of vision, paresthesias, slow mentation. He denied a history of blood clots or gout. He smoked about 6 cigarettes daily and smoked for many years prior. He denied any dysuria or hematuria or weight changes. He denied living in high altitudes or the use of testosterone.   INTERIM HISTORY [name] returns for follow-up, he is accompanied by an interpreter who is also his friend and neighbor. He just came back from [place] last week, he was there  for 4 months, he did not have any lab work or phlebotomy over the.  He is clinically doing well. He denies any pain or other symptoms, no other medical issues since his last visit 4 months ago.  MEDICAL HISTORY: Past Medical History  Diagnosis Date  . Hypertension   . Diabetes mellitus   . Asthma   . Hyperlipidemia   . Coronary artery disease     CABG 12/12 (LIMA-LAD, SVG-DX, SVG-OM, SVG-PDA) c/b post op AFib Rx with amio Rx. s/p successful PCI of the right coronary artery utilizing overlapping drug-eluting stents in the mid and distal vessel (done after abnormal nuc)  . Chest pain     01/2011 in setting of HTN urgency: Chest CT demonstrated no pulmonary embolus, postoperative appearance following CABG and small left pleural effusion and dependent atelectasis in the left lung  . Elevated hemoglobin (HCC)      ALLERGIES:  has No Known Allergies.  MEDICATIONS: has a current medication list which includes the following prescription(s): amlodipine, aspirin, azithromycin, carvedilol, clopidogrel, enalapril, glimepiride, metformin, multivitamin-iron-minerals-folic acid, nitroglycerin, and pravastatin, and the following Facility-Administered Medications: sodium chloride.  SURGICAL HISTORY:  Past Surgical History  Procedure Laterality Date  . Cardiac catheterization    . Coronary artery bypass graft  12/30/2010    Procedure: CORONARY ARTERY BYPASS GRAFTING (CABG);  Surgeon: Grace Isaac, MD;  Location: [facility];  Service: Open Heart Surgery;  Laterality: N/A;  . Left heart catheterization with coronary/graft angiogram  03/07/2011    Procedure: LEFT HEART CATHETERIZATION WITH Beatrix Fetters;  Surgeon: Legrand Como  Burt Knack, MD;  Location: [facility] CATH LAB;  Service: Cardiovascular;;  . Percutaneous coronary stent intervention (pci-s)  03/07/2011    Procedure: PERCUTANEOUS CORONARY STENT INTERVENTION (PCI-S);  Surgeon: Sherren Mocha, MD;  Location: [facility] CATH LAB;  Service: Cardiovascular;;  . Left  heart catheterization with coronary angiogram N/A 01/12/2013    Procedure: LEFT HEART CATHETERIZATION WITH CORONARY ANGIOGRAM;  Surgeon: Blane Ohara, MD;  Location: [facility] CATH LAB;  Service: Cardiovascular;  Laterality: N/A;    REVIEW OF SYSTEMS:   Constitutional: Denies fevers, chills or abnormal weight loss Eyes: Denies blurriness of vision Ears, nose, mouth, throat, and face: Denies mucositis or sore throat Respiratory: Denies cough, dyspnea or wheezes Cardiovascular: Denies palpitation, chest discomfort or lower extremity swelling Gastrointestinal:  Denies nausea, heartburn or change in bowel habits Skin: Denies abnormal skin rashes Lymphatics: Denies new lymphadenopathy or easy bruising Neurological:Denies numbness, tingling or new weaknesses Behavioral/Psych: Mood is stable, no new changes  All other systems were reviewed with the patient and are negative.  PHYSICAL EXAMINATION: ECOG PERFORMANCE STATUS: 0 - Asymptomatic  Blood pressure 132/77, pulse 97, temperature 98.2 F (36.8 C), temperature source Oral, resp. rate 17, height _0  (1.676 m), weight 209 lb 8 oz (95.029 kg), SpO2 98 %.  GENERAL:alert, no distress and comfortable; moderately obese SKIN: skin color, texture, turgor are normal, no rashes or significant lesions EYES: normal, Conjunctiva are pink and non-injected, sclera clear OROPHARYNX:no exudate, no erythema and lips, buccal mucosa, and tongue normal  NECK: supple, thyroid normal size, non-tender, without nodularity LYMPH:  no palpable lymphadenopathy in the cervical, axillary or supraclavicular LUNGS: clear to auscultation and percussion with normal breathing effort HEART: regular rate & rhythm and no murmurs and no lower extremity edema ABDOMEN:abdomen soft, non-tender and normal bowel sounds Musculoskeletal:no cyanosis of digits and no clubbing  NEURO: alert & oriented x 3 with fluent speech, no focal motor/sensory deficits  LABORATORY DATA: CBC Latest  Ref Rng 12/19/2014 09/12/2014 07/31/2014  WBC 4.0 - 10.3 10e3/uL 5.9 6.3 6.2  Hemoglobin 13.0 - 17.1 g/dL 14.3 14.0 13.3  Hematocrit 38.4 - 49.9 % 46.3 45.5 43.6  Platelets 140 - 400 10e3/uL 241 248 233    CMP Latest Ref Rng 03/21/2014 01/18/2014 05/23/2013  Glucose 70 - 140 mg/dl 133 144(H) 228(H)  BUN 7.0 - 26.0 mg/dL 18.5 16.1 17.3  Creatinine 0.7 - 1.3 mg/dL 1.0 1.0 1.1  Sodium 136 - 145 mEq/L 140 139 141  Potassium 3.5 - 5.1 mEq/L 4.0 4.0 4.2  Chloride 96 - 112 mEq/L - - -  CO2 22 - 29 mEq/L _1 Calcium 8.4 - 10.4 mg/dL 9.7 9.5 9.3  Total Protein 6.4 - 8.3 g/dL - 7.4 -  Total Bilirubin 0.20 - 1.20 mg/dL - 1.47(H) -  Alkaline Phos 40 - 150 U/L - 69 -  AST 5 - 34 U/L - 19 -  ALT 0 - 55 U/L - 35 -   Erythropoietin  Status: Finalresult Visible to patient:  Not Released Nextappt: 02/01/2014 at 09:00 AM in [facility] Room) Dx:  Polycythemia, secondary         Ref Range 49yrago    Erythropoietin 2.6 - 18.5 mIU/mL 13.7      PATHOLOGY REPORT 02/01/2014 Diagnosis Bone Marrow, Aspirate,Biopsy, and Clot, left iliac crest - SLIGHTLY HYPERCELLULAR BONE MARROW FOR AGE WITH TRILINEAGE HEMATOPOIESIS. - SEE COMMENT. PERIPHERAL BLOOD: - ERYTHROCYTOSIS. Diagnosis Note The overall findings are not considered specific or diagnostic of a myeloproliferative neoplasm. Correlation with  cytogenetic and JAK-2 mutation analysis is recommended.  Cytogenetics: normal   RADIOGRAPHIC STUDIES: 05/20/2013 CT ABDOMEN WITH CONTRAST TECHNIQUE:  Multidetector CT imaging of the abdomen was performed using the standard protocol following bolus administration of intravenous contrast. CONTRAST: 132m OMNIPAQUE IOHEXOL 300 MG/ML SOLN  COMPARISON: 04/12/2006 FINDINGS: The lung bases appear clear. No pleural or pericardial effusion.  No focal liver abnormality identified. The gallbladder is normal. There is no biliary dilatation. The spleen is unremarkable measuring 10.2 cm in  length. The adrenal glands are both normal. The right kidney is normal. Cyst within the upper pole of the left kidney is identified measuring 1.2 cm. Normal caliber of the abdominal aorta. No aneurysm. There is no upper abdominal adenopathy identified. The stomach is normal. The small bowel loops are unremarkable. Normal appearance of the colon. Review of the visualized osseous structures is significant for mild spondylosis. IMPRESSION: 1. No acute findings identified within the upper abdomen. 2. No mass or adenopathy and no evidence for splenomegaly.    ASSESSMENT:  Keith Flores a 54y.o. male with a history of CAD/CABG presents with persistence of elevated (H/H) without thrombocytosis, leukocytosis. He also has an elevated vitamin b12. CT of Abdomen (2008) was negative for splenomegaly. JAK2 was negative. Normal EPO level. He denies classic aquagenic pruritus, negative for erythromegalalia. Secondary causes due to [place]/testerone are felt to be less likely, but he dose have extensive smoking history.  1.  Polycythemia, secondary to smoking  --He only needs one major WHO criteria for P vera, and does not meet any of the minor criteria. I reviewed his bone marrow biopsy with him, which does not support myeloproliferative disorder. JAK2 mutation was negative, his erythropoietin level was normal.  -I think his polycythemia is probable secondary to his extensive smoking history., Although he has never been hypoxic. -He had sleep study in July 2016. He was told by his primary care physicians that he has sleep apnea but does not need CPAP.  -Given his prior extensive history of coronary artery disease, I recommend continue phlebotomy to keep his hematocrit less than 45%. I do not recommend Hydrea.  His hematocrit today is 46.3%, we'll arrange phlebotomy in the next week -He will continue daily baby aspirin (81 mg) daily.   2. CAD -continue follow up with his cardiologist    3. HTN, DM, hyperlipidemia -follow up with PCP    PLAN: -CBC every 2 months and Phlebotomy if hematocrit above 45%.  -I'll see him back in 6 months -Phlebotomy within the next week  All questions were answered. The patient knows to call the clinic with any problems, questions or concerns. We can certainly see the patient much sooner if necessary.  I spent 15 minutes counseling the patient face to face in the presence of an arabic intepreter. The total time spent in the appointment was 20 minutes.    FTruitt Merle MD 12/19/2014

## 2014-12-19 NOTE — Telephone Encounter (Signed)
Gave and printed appt sched and avs for pt for Jan March and May

## 2014-12-27 ENCOUNTER — Ambulatory Visit (HOSPITAL_BASED_OUTPATIENT_CLINIC_OR_DEPARTMENT_OTHER): Payer: Medicaid Other

## 2014-12-27 VITALS — BP 135/74 | HR 87 | Temp 98.1°F | Resp 18

## 2014-12-27 DIAGNOSIS — D751 Secondary polycythemia: Secondary | ICD-10-CM

## 2014-12-27 NOTE — Patient Instructions (Signed)
Therapeutic Phlebotomy Discharge Instructions  - Increase your fluid intake over the next 4 hours  - No smoking for 30 minutes Therapeutic Phlebotomy Therapeutic phlebotomy is the controlled removal of blood from a person's body for the purpose of treating a medical condition. The procedure is similar to donating blood. Usually, about a pint (470 mL, or 0.47L) of blood is removed. The average adult has 9-12 pints (4.3-5.7 L) of blood. Therapeutic phlebotomy may be used to treat the following medical conditions:  Hemochromatosis. This is a condition in which the blood contains too much iron.  Polycythemia vera. This is a condition in which the blood contains too many red blood cells.  Porphyria cutanea tarda. This is a disease in which an important part of hemoglobin is not made properly. It results in the buildup of abnormal amounts of porphyrins in the body.  Sickle cell disease. This is a condition in which the red blood cells form an abnormal crescent shape rather than a round shape. LET [facility] CARE PROVIDER KNOW ABOUT:  Any allergies you have.  All medicines you are taking, including vitamins, herbs, eye drops, creams, and over-the-counter medicines.  Previous problems you or members of your family have had with the use of anesthetics.  Any blood disorders you have.  Previous surgeries you have had.  Any medical conditions you may have. RISKS AND COMPLICATIONS Generally, this is a safe procedure. However, problems may occur, including:  Nausea or light-headedness.  Low blood pressure.  Soreness, bleeding, swelling, or bruising at the needle insertion site.  Infection. BEFORE THE PROCEDURE  Follow instructions from your health care provider about eating or drinking restrictions.  Ask your health care provider about changing or stopping your regular medicines. This is especially important if you are taking diabetes medicines or blood thinners.  Wear clothing with  sleeves that can be raised above the elbow.  Plan to have someone take you home after the procedure.  You may have a blood sample taken. PROCEDURE  A needle will be inserted into one of your veins.  Tubing and a collection bag will be attached to that needle.  Blood will flow through the needle and tubing into the collection bag.  You may be asked to open and close your hand slowly and continually during the entire collection.  After the specified amount of blood has been removed from your body, the collection bag and tubing will be clamped.  The needle will be removed from your vein.  Pressure will be held on the site of the needle insertion to stop the bleeding.  A bandage (dressing) will be placed over the needle insertion site. The procedure may vary among health care providers and hospitals. AFTER THE PROCEDURE  Your recovery will be assessed and monitored.  You can return to your normal activities as directed by your health care provider.   This information is not intended to replace advice given to you by your health care provider. Make sure you discuss any questions you have with your health care provider.   Document Released: 06/10/2010 Document Revised: 05/23/2014 Document Reviewed: 01/02/2014 Elsevier Interactive Patient Education 2016 [employer].  - Avoid using the affected arm (the one you had the blood drawn from) for heavy lifting or other activities.  - You may resume all normal activities after 30 minutes.  You are to notify the office if you experience:   - Persistent dizziness and/or lightheadedness -Uncontrolled or excessive bleeding at the site.

## 2015-02-13 ENCOUNTER — Ambulatory Visit (HOSPITAL_BASED_OUTPATIENT_CLINIC_OR_DEPARTMENT_OTHER): Payer: Medicaid Other

## 2015-02-13 ENCOUNTER — Other Ambulatory Visit (HOSPITAL_BASED_OUTPATIENT_CLINIC_OR_DEPARTMENT_OTHER): Payer: Medicaid Other

## 2015-02-13 VITALS — BP 106/56 | HR 88 | Temp 98.6°F | Resp 20

## 2015-02-13 DIAGNOSIS — D751 Secondary polycythemia: Secondary | ICD-10-CM

## 2015-02-13 LAB — CBC WITH DIFFERENTIAL/PLATELET
BASO%: 0.3 % (ref 0.0–2.0)
BASOS ABS: 0 10*3/uL (ref 0.0–0.1)
EOS%: 2.6 % (ref 0.0–7.0)
Eosinophils Absolute: 0.2 10*3/uL (ref 0.0–0.5)
HCT: 47 % (ref 38.4–49.9)
HEMOGLOBIN: 14.6 g/dL (ref 13.0–17.1)
LYMPH%: 29 % (ref 14.0–49.0)
MCH: 22.2 pg — AB (ref 27.2–33.4)
MCHC: 31.1 g/dL — ABNORMAL LOW (ref 32.0–36.0)
MCV: 71.4 fL — ABNORMAL LOW (ref 79.3–98.0)
MONO#: 0.5 10*3/uL (ref 0.1–0.9)
MONO%: 7.8 % (ref 0.0–14.0)
NEUT#: 3.8 10*3/uL (ref 1.5–6.5)
NEUT%: 60.3 % (ref 39.0–75.0)
Platelets: 222 10*3/uL (ref 140–400)
RBC: 6.58 10*6/uL — ABNORMAL HIGH (ref 4.20–5.82)
RDW: 17.1 % — ABNORMAL HIGH (ref 11.0–14.6)
WBC: 6.3 10*3/uL (ref 4.0–10.3)
lymph#: 1.8 10*3/uL (ref 0.9–3.3)

## 2015-02-13 NOTE — Patient Instructions (Signed)
Therapeutic Phlebotomy, Care After  Refer to this sheet in the next few weeks. These instructions provide you with information about caring for yourself after your procedure. Your health care provider may also give you more specific instructions. Your treatment has been planned according to current medical practices, but problems sometimes occur. Call your health care provider if you have any problems or questions after your procedure.  WHAT TO EXPECT AFTER THE PROCEDURE  After your procedure, it is common to have:   Light-headedness or dizziness. You may feel faint.   Nausea.   Tiredness.  HOME CARE INSTRUCTIONS  Activities   Return to your normal activities as directed by your health care provider. Most people can go back to their normal activities right away.   Avoid strenuous physical activity and heavy lifting or pulling for about 5 hours after the procedure. Do not lift anything that is heavier than 10 lb (4.5 kg).   Athletes should avoid strenuous exercise for at least 12 hours.   Change positions slowly for the remainder of the day. This will help to prevent light-headedness or fainting.   If you feel light-headed, lie down until the feeling goes away.  Eating and Drinking   Be sure to eat well-balanced meals for the next 24 hours.   Drink enough fluid to keep your urine clear or pale yellow.   Avoid drinking alcohol on the day that you had the procedure.  Care of the Needle Insertion Site   Keep your bandage dry. You can remove the bandage after about 5 hours or as directed by your health care provider.   If you have bleeding from the needle insertion site, elevate your arm and press firmly on the site until the bleeding stops.   If you have bruising at the site, apply ice to the area:   Put ice in a plastic bag.   Place a towel between your skin and the bag.   Leave the ice on for 20 minutes, 2-3 times a day for the first 24 hours.   If the swelling does not go away after 24 hours, apply  a warm, moist washcloth to the area for 20 minutes, 2-3 times a day.  General Instructions   Avoid smoking for at least 30 minutes after the procedure.   Keep all follow-up visits as directed by your health care provider. It is important to continue with further therapeutic phlebotomy treatments as directed.  SEEK MEDICAL CARE IF:   You have redness, swelling, or pain at the needle insertion site.   You have fluid, blood, or pus coming from the needle insertion site.   You feel light-headed, dizzy, or nauseated, and the feeling does not go away.   You notice new bruising at the needle insertion site.   You feel weaker than normal.   You have a fever or chills.  SEEK IMMEDIATE MEDICAL CARE IF:   You have severe nausea or vomiting.   You have chest pain.   You have trouble breathing.    This information is not intended to replace advice given to you by your health care provider. Make sure you discuss any questions you have with your health care provider.    Document Released: 06/10/2010 Document Revised: 05/23/2014 Document Reviewed: 01/02/2014  Elsevier Interactive Patient Education 2016 Elsevier Inc[employer]

## 2015-02-13 NOTE — Progress Notes (Signed)
1430 to 1537 phlebotomized 515 gm from RMFA using 16 g phlebotomy set. Pt tolerated well. Took snack and 30 minute observation. Instructed to get up slowly to prevent dizzyness.

## 2015-03-13 ENCOUNTER — Other Ambulatory Visit: Payer: Medicaid Other

## 2015-03-13 ENCOUNTER — Ambulatory Visit: Payer: Medicaid Other | Admitting: Hematology

## 2015-04-02 ENCOUNTER — Encounter: Payer: Self-pay | Admitting: Cardiovascular Disease

## 2015-04-02 ENCOUNTER — Ambulatory Visit (INDEPENDENT_AMBULATORY_CARE_PROVIDER_SITE_OTHER): Payer: Medicaid Other | Admitting: Cardiovascular Disease

## 2015-04-02 VITALS — BP 140/68 | HR 88 | Ht 66.0 in | Wt 214.0 lb

## 2015-04-02 DIAGNOSIS — I10 Essential (primary) hypertension: Secondary | ICD-10-CM | POA: Diagnosis not present

## 2015-04-02 DIAGNOSIS — I2510 Atherosclerotic heart disease of native coronary artery without angina pectoris: Secondary | ICD-10-CM

## 2015-04-02 MED ORDER — SILDENAFIL CITRATE 100 MG PO TABS: ORAL_TABLET | ORAL | Status: DC

## 2015-04-02 NOTE — Patient Instructions (Addendum)
Medication Instructions:  Your physician has recommended you make the following change in your medication:  1. START Viagra 100mg  take one-half tablet by mouth daily as needed for erectile dysfunction  Labwork: Your physician recommends that you return for a FASTING LIPID and CMP--nothing to eat or drink after midnight, lab opens at 7:30 AM  Testing/Procedures: No new orders.   Follow-Up: Your physician wants you to follow-up in: 1 YEAR with Dr Burt Knack.  You will receive a reminder letter in the mail two months in advance. If you don't receive a letter, please call our office to schedule the follow-up appointment.   Any Other Special Instructions Will Be Listed Below (If Applicable).     If you need a refill on your cardiac medications before your next appointment, please call your pharmacy.

## 2015-04-02 NOTE — Progress Notes (Signed)
Cardiology Office Note Date:  04/02/2015   ID:  TAIT BALISTRERI, DOB 04-16-1960, MRN [id number]  PCP:  [name], MD  Cardiologist:  Sherren Mocha, MD    Chief Complaint  Patient presents with  . scheduled office visit    Essential Hypertension. C/O LEE. Denies cp, sob, or claudication    History of Present Illness: Keith Flores is a 55 y.o. male who presents for follow-up of coronary artery disease.  He has coronary artery disease status post CABG in 2010. The patient had early graft closure involving the vein graft to right coronary artery and he underwent extensive stenting of the native RCA with a good result. He's followed for hypertension and hyperlipidemia as well.  Patient is doing fairly well. He reports no recent change in symptoms. He complains of occasional chest pain and takes nitroglycerin less than once per month. Symptoms have been progressive. No dyspnea, orthopnea, or PND. He does have leg swelling. He is compliant with his medications.  Past Medical History  Diagnosis Date  . Hypertension   . Diabetes mellitus   . Asthma   . Hyperlipidemia   . Coronary artery disease     CABG 12/12 (LIMA-LAD, SVG-DX, SVG-OM, SVG-PDA) c/b post op AFib Rx with amio Rx. s/p successful PCI of the right coronary artery utilizing overlapping drug-eluting stents in the mid and distal vessel (done after abnormal nuc)  . Chest pain     01/2011 in setting of HTN urgency: Chest CT demonstrated no pulmonary embolus, postoperative appearance following CABG and small left pleural effusion and dependent atelectasis in the left lung  . Elevated hemoglobin (HCC)     Past Surgical History  Procedure Laterality Date  . Cardiac catheterization    . Coronary artery bypass graft  12/30/2010    Procedure: CORONARY ARTERY BYPASS GRAFTING (CABG);  Surgeon: Grace Isaac, MD;  Location: [facility];  Service: Open Heart Surgery;  Laterality: N/A;  . Left heart catheterization with  coronary/graft angiogram  03/07/2011    Procedure: LEFT HEART CATHETERIZATION WITH Beatrix Fetters;  Surgeon: Sherren Mocha, MD;  Location: [facility] CATH LAB;  Service: Cardiovascular;;  . Percutaneous coronary stent intervention (pci-s)  03/07/2011    Procedure: PERCUTANEOUS CORONARY STENT INTERVENTION (PCI-S);  Surgeon: Sherren Mocha, MD;  Location: [facility] CATH LAB;  Service: Cardiovascular;;  . Left heart catheterization with coronary angiogram N/A 01/12/2013    Procedure: LEFT HEART CATHETERIZATION WITH CORONARY ANGIOGRAM;  Surgeon: Blane Ohara, MD;  Location: [facility] CATH LAB;  Service: Cardiovascular;  Laterality: N/A;    Current Outpatient Prescriptions  Medication Sig Dispense Refill  . amLODipine (NORVASC) 10 MG tablet Take 1 tablet (10 mg total) by mouth daily. 90 tablet 0  . aspirin 81 MG tablet Take 81 mg by mouth daily.    . carvedilol (COREG) 25 MG tablet Take 1 tablet (25 mg total) by mouth 2 (two) times daily with a meal. 180 tablet 0  . clopidogrel (PLAVIX) 75 MG tablet Take 1 tablet (75 mg total) by mouth daily. 90 tablet 0  . enalapril (VASOTEC) 10 MG tablet Take 1 tablet (10 mg total) by mouth 2 (two) times daily. 180 tablet 0  . glimepiride (AMARYL) 2 MG tablet Take 2 mg by mouth daily with breakfast.    . metFORMIN (GLUCOPHAGE) 1000 MG tablet Take 1 tablet (1,000 mg total) by mouth daily after lunch. 90 tablet 0  . multivitamin-iron-minerals-folic acid (CENTRUM) chewable tablet Chew 1 tablet by mouth daily with breakfast.    .  nitroGLYCERIN (NITROSTAT) 0.4 MG SL tablet Place 1 tablet (0.4 mg total) under the tongue every 5 (five) minutes as needed. For chest pain 25 tablet 3  . pravastatin (PRAVACHOL) 40 MG tablet Take 1 tablet (40 mg total) by mouth every evening. 90 tablet 0  . sildenafil (VIAGRA) 100 MG tablet Take as directed daily as needed prior to sexual activity 10 tablet 1   No current facility-administered medications for this visit.   Facility-Administered  Medications Ordered in Other Visits  Medication Dose Route Frequency Provider Last Rate Last Dose  . 0.9 %  sodium chloride infusion  250 mL Intravenous Continuous Truitt Merle, MD   Stopped at 07/03/14 1523   Allergies:   Review of patient's allergies indicates no known allergies.   Social History:  The patient  reports that he has been smoking.  He has never used smokeless tobacco. He reports that he does not drink alcohol or use illicit drugs.   Family History:  The patient's  family history is negative for Anesthesia problems, Hypotension, Malignant hyperthermia, and Pseudochol deficiency.   ROS:  Please see the history of present illness.  Otherwise, review of systems is positive for erectile dysfunction.  All other systems are reviewed and negative.   PHYSICAL EXAM: VS:  BP 140/68 mmHg  Pulse 88  Ht _0  (1.676 m)  Wt 214 lb (97.07 kg)  BMI 34.56 kg/m2 , BMI Body mass index is 34.56 kg/(m^2). GEN: Well nourished, well developed, in no acute distress HEENT: normal Neck: no JVD, no masses. No carotid bruits Cardiac: RRR without murmur or gallop                Respiratory:  clear to auscultation bilaterally, normal work of breathing GI: soft, nontender, nondistended, + BS, obese MS: no deformity or atrophy Ext: no pretibial edema, pedal pulses 2+= bilaterally Skin: warm and dry, no rash Neuro:  Strength and sensation are intact Psych: euthymic mood, full affect  EKG:  EKG is ordered today. The ekg ordered today shows NSR 88 bpm, within normal limits  Recent Labs: 02/13/2015: HGB 14.6; Platelets 222   Lipid Panel     Component Value Date/Time   CHOL 128 01/04/2013 0754   TRIG 104.0 01/04/2013 0754   HDL 31.60* 01/04/2013 0754   CHOLHDL 4 01/04/2013 0754   VLDL 20.8 01/04/2013 0754   LDLCALC 76 01/04/2013 0754    Wt Readings from Last 3 Encounters:  04/02/15 214 lb (97.07 kg)  12/19/14 209 lb 8 oz (95.029 kg)  08/01/14 205 lb (92.987 kg)    Cardiac Studies  Reviewed: Cardiac catheterization 01/12/2013: Final Conclusions:  1. Severe native three-vessel coronary artery disease with continued patency of the native RCA stented segment, severe stenosis of the LAD with brisk filling from the mammary graft, and continued patency of the saphenous vein graft to left circumflex distribution  2. Status post aortocoronary bypass surgery with known occlusion of the saphenous vein graft to PDA, patency of the vein graft to OM and diagonal, and continued patency of the LIMA to LAD  3. Preserved left ventricular systolic function  Recommendations: Continued medical therapy. The patient has stable coronary anatomy.  ASSESSMENT AND PLAN: 1.  CAD, native vessel, with angina CCS Class II. The patient has mild episodic angina. Lifestyle limiting. There is no change in the pattern of his symptoms. Would continue his current medical program which is reviewed today. His most recent heart catheterization is reviewed as above.  2. Essential hypertension: Blood  pressure is controlled on a combination of amlodipine, carvedilol, and enalapril.  3. Hyperlipidemia: Treated with pravastatin. Recommend updated lipids and LFTs.  4. Polycythemia: Most recent hemoglobins have been in normal range. He's followed closely at the cancer center.  5. Type 2 diabetes: Treated with glimepride and metformin.  6. Erectile dysfunction: Rx written for viagra 100 mg, take 1/2 as needed  Current medicines are reviewed with the patient today.  The patient does not have concerns regarding medicines.  Labs/ tests ordered today include:   Orders Placed This Encounter  Procedures  . Lipid panel  . Comp Met (CMET)  . EKG 12-Lead   Disposition:   FU one year  Signed, Sherren Mocha, MD  04/02/2015 3:51 PM    [facility] [place], [place], [place]  [address] Phone: [phone]; Fax: [phone]

## 2015-04-05 ENCOUNTER — Other Ambulatory Visit: Payer: Self-pay | Admitting: *Deleted

## 2015-04-05 ENCOUNTER — Telehealth: Payer: Self-pay | Admitting: Cardiovascular Disease

## 2015-04-05 ENCOUNTER — Other Ambulatory Visit (INDEPENDENT_AMBULATORY_CARE_PROVIDER_SITE_OTHER): Payer: Medicaid Other | Admitting: *Deleted

## 2015-04-05 DIAGNOSIS — I10 Essential (primary) hypertension: Secondary | ICD-10-CM

## 2015-04-05 DIAGNOSIS — I2510 Atherosclerotic heart disease of native coronary artery without angina pectoris: Secondary | ICD-10-CM | POA: Diagnosis not present

## 2015-04-05 LAB — LIPID PANEL
Cholesterol: 109 mg/dL — ABNORMAL LOW (ref 125–200)
HDL: 32 mg/dL — AB (ref >=40)
LDL Cholesterol: 61 mg/dL (ref <130)
Total CHOL/HDL Ratio: 3.4 Ratio (ref <=5.0)
Triglycerides: 81 mg/dL (ref <150)
VLDL: 16 mg/dL (ref <30)

## 2015-04-05 LAB — COMPREHENSIVE METABOLIC PANEL
ALK PHOS: 73 U/L (ref 40–115)
ALT: 28 U/L (ref 9–46)
AST: 19 U/L (ref 10–35)
Albumin: 4.3 g/dL (ref 3.6–5.1)
BUN: 13 mg/dL (ref 7–25)
CALCIUM: 9.5 mg/dL (ref 8.6–10.3)
CO2: 24 mmol/L (ref 20–31)
Chloride: 104 mmol/L (ref 98–110)
Creat: 0.9 mg/dL (ref 0.70–1.33)
GLUCOSE: 140 mg/dL — AB (ref 65–99)
POTASSIUM: 3.9 mmol/L (ref 3.5–5.3)
Sodium: 138 mmol/L (ref 135–146)
Total Bilirubin: 0.7 mg/dL (ref 0.2–1.2)
Total Protein: 7.1 g/dL (ref 6.1–8.1)

## 2015-04-05 MED ORDER — AMLODIPINE BESYLATE 10 MG PO TABS: 10.0000 mg | ORAL_TABLET | Freq: Every day | ORAL | Status: DC

## 2015-04-05 NOTE — Telephone Encounter (Signed)
NEW MESSAGE   *STAT* If patient is at the pharmacy, call can be transferred to refill team.   1. Which medications need to be refilled? (please list name of each medication and dose if known) amlodipine 10mg , sildenafil 100mg   2. Which pharmacy/location (including street and city if local pharmacy) is medication to be sent to? [facility] [address] [place]  3. Do they need a 30 day or 90 day supply? Did not say  Pt could not speak fluent english, could barely understand if this is exactly what he wanted. May want to call and verify

## 2015-04-05 NOTE — Telephone Encounter (Signed)
sildenafil (VIAGRA) 100 MG tablet 10 tablet 1 04/02/2015      Sig: Take as directed daily as needed prior to sexual activity    E-Prescribing Status: Receipt confirmed by pharmacy (04/02/2015 3:43 PM EDT)      Rx for amlodipine sent in. Sildenafil rx was sent yesterday.

## 2015-04-10 ENCOUNTER — Other Ambulatory Visit (HOSPITAL_BASED_OUTPATIENT_CLINIC_OR_DEPARTMENT_OTHER): Payer: Medicaid Other

## 2015-04-10 ENCOUNTER — Ambulatory Visit (HOSPITAL_BASED_OUTPATIENT_CLINIC_OR_DEPARTMENT_OTHER): Payer: Medicaid Other

## 2015-04-10 VITALS — BP 117/68 | HR 87 | Temp 98.4°F | Resp 20

## 2015-04-10 DIAGNOSIS — D751 Secondary polycythemia: Secondary | ICD-10-CM | POA: Diagnosis not present

## 2015-04-10 LAB — CBC WITH DIFFERENTIAL/PLATELET
BASO%: 1.1 % (ref 0.0–2.0)
Basophils Absolute: 0.1 10*3/uL (ref 0.0–0.1)
EOS%: 4.1 % (ref 0.0–7.0)
Eosinophils Absolute: 0.2 10*3/uL (ref 0.0–0.5)
HCT: 47.4 % (ref 38.4–49.9)
HGB: 14.7 g/dL (ref 13.0–17.1)
LYMPH%: 32.7 % (ref 14.0–49.0)
MCH: 22 pg — ABNORMAL LOW (ref 27.2–33.4)
MCHC: 31 g/dL — AB (ref 32.0–36.0)
MCV: 70.7 fL — ABNORMAL LOW (ref 79.3–98.0)
MONO#: 0.4 10*3/uL (ref 0.1–0.9)
MONO%: 8 % (ref 0.0–14.0)
NEUT#: 2.9 10*3/uL (ref 1.5–6.5)
NEUT%: 54.1 % (ref 39.0–75.0)
PLATELETS: 223 10*3/uL (ref 140–400)
RBC: 6.69 10*6/uL — AB (ref 4.20–5.82)
RDW: 20.2 % — ABNORMAL HIGH (ref 11.0–14.6)
WBC: 5.4 10*3/uL (ref 4.0–10.3)
lymph#: 1.8 10*3/uL (ref 0.9–3.3)

## 2015-04-10 NOTE — Progress Notes (Signed)
Therapeutic phlebotomy performed from 1407 to 1417 and obtained 533 g; using 16g in L AC and pt tolerated well.  Eating a snack now in obs period.

## 2015-04-10 NOTE — Patient Instructions (Signed)
Therapeutic Phlebotomy, Care After  Refer to this sheet in the next few weeks. These instructions provide you with information about caring for yourself after your procedure. Your health care provider may also give you more specific instructions. Your treatment has been planned according to current medical practices, but problems sometimes occur. Call your health care provider if you have any problems or questions after your procedure.  WHAT TO EXPECT AFTER THE PROCEDURE  After your procedure, it is common to have:   Light-headedness or dizziness. You may feel faint.   Nausea.   Tiredness.  HOME CARE INSTRUCTIONS  Activities   Return to your normal activities as directed by your health care provider. Most people can go back to their normal activities right away.   Avoid strenuous physical activity and heavy lifting or pulling for about 5 hours after the procedure. Do not lift anything that is heavier than 10 lb (4.5 kg).   Athletes should avoid strenuous exercise for at least 12 hours.   Change positions slowly for the remainder of the day. This will help to prevent light-headedness or fainting.   If you feel light-headed, lie down until the feeling goes away.  Eating and Drinking   Be sure to eat well-balanced meals for the next 24 hours.   Drink enough fluid to keep your urine clear or pale yellow.   Avoid drinking alcohol on the day that you had the procedure.  Care of the Needle Insertion Site   Keep your bandage dry. You can remove the bandage after about 5 hours or as directed by your health care provider.   If you have bleeding from the needle insertion site, elevate your arm and press firmly on the site until the bleeding stops.   If you have bruising at the site, apply ice to the area:   Put ice in a plastic bag.   Place a towel between your skin and the bag.   Leave the ice on for 20 minutes, 2-3 times a day for the first 24 hours.   If the swelling does not go away after 24 hours, apply  a warm, moist washcloth to the area for 20 minutes, 2-3 times a day.  General Instructions   Avoid smoking for at least 30 minutes after the procedure.   Keep all follow-up visits as directed by your health care provider. It is important to continue with further therapeutic phlebotomy treatments as directed.  SEEK MEDICAL CARE IF:   You have redness, swelling, or pain at the needle insertion site.   You have fluid, blood, or pus coming from the needle insertion site.   You feel light-headed, dizzy, or nauseated, and the feeling does not go away.   You notice new bruising at the needle insertion site.   You feel weaker than normal.   You have a fever or chills.  SEEK IMMEDIATE MEDICAL CARE IF:   You have severe nausea or vomiting.   You have chest pain.   You have trouble breathing.    This information is not intended to replace advice given to you by your health care provider. Make sure you discuss any questions you have with your health care provider.    Document Released: 06/10/2010 Document Revised: 05/23/2014 Document Reviewed: 01/02/2014  Elsevier Interactive Patient Education 2016 Elsevier Inc[employer]

## 2015-06-05 ENCOUNTER — Other Ambulatory Visit (HOSPITAL_BASED_OUTPATIENT_CLINIC_OR_DEPARTMENT_OTHER): Payer: Medicaid Other

## 2015-06-05 ENCOUNTER — Telehealth: Payer: Self-pay | Admitting: Hematology

## 2015-06-05 ENCOUNTER — Ambulatory Visit (HOSPITAL_BASED_OUTPATIENT_CLINIC_OR_DEPARTMENT_OTHER): Payer: Medicaid Other | Admitting: Hematology

## 2015-06-05 ENCOUNTER — Encounter: Payer: Self-pay | Admitting: Hematology

## 2015-06-05 ENCOUNTER — Ambulatory Visit (HOSPITAL_BASED_OUTPATIENT_CLINIC_OR_DEPARTMENT_OTHER): Payer: Medicaid Other

## 2015-06-05 VITALS — BP 132/73 | HR 81 | Temp 98.3°F | Resp 18 | Ht 66.0 in | Wt 211.3 lb

## 2015-06-05 VITALS — BP 131/83 | HR 78 | Temp 97.9°F | Resp 18

## 2015-06-05 DIAGNOSIS — D751 Secondary polycythemia: Secondary | ICD-10-CM | POA: Diagnosis not present

## 2015-06-05 DIAGNOSIS — E119 Type 2 diabetes mellitus without complications: Secondary | ICD-10-CM | POA: Diagnosis not present

## 2015-06-05 DIAGNOSIS — I2510 Atherosclerotic heart disease of native coronary artery without angina pectoris: Secondary | ICD-10-CM

## 2015-06-05 DIAGNOSIS — Z720 Tobacco use: Secondary | ICD-10-CM | POA: Diagnosis present

## 2015-06-05 DIAGNOSIS — I10 Essential (primary) hypertension: Secondary | ICD-10-CM | POA: Diagnosis not present

## 2015-06-05 LAB — CBC WITH DIFFERENTIAL/PLATELET
BASO%: 0.5 % (ref 0.0–2.0)
BASOS ABS: 0 10*3/uL (ref 0.0–0.1)
EOS ABS: 0.3 10*3/uL (ref 0.0–0.5)
EOS%: 4.9 % (ref 0.0–7.0)
HEMATOCRIT: 46.6 % (ref 38.4–49.9)
HEMOGLOBIN: 14.7 g/dL (ref 13.0–17.1)
LYMPH#: 2 10*3/uL (ref 0.9–3.3)
LYMPH%: 35 % (ref 14.0–49.0)
MCH: 23.6 pg — ABNORMAL LOW (ref 27.2–33.4)
MCHC: 31.5 g/dL — ABNORMAL LOW (ref 32.0–36.0)
MCV: 74.7 fL — ABNORMAL LOW (ref 79.3–98.0)
MONO#: 0.4 10*3/uL (ref 0.1–0.9)
MONO%: 6.4 % (ref 0.0–14.0)
NEUT%: 53.2 % (ref 39.0–75.0)
NEUTROS ABS: 3.1 10*3/uL (ref 1.5–6.5)
Platelets: 191 10*3/uL (ref 140–400)
RBC: 6.24 10*6/uL — ABNORMAL HIGH (ref 4.20–5.82)
RDW: 17.7 % — AB (ref 11.0–14.6)
WBC: 5.7 10*3/uL (ref 4.0–10.3)

## 2015-06-05 NOTE — Progress Notes (Signed)
Phlebotomy preformed in L AC.  16G phlebotomy set used without difficulty. Pt tolerated sandwich and po fluids after phlebotomy.  Pt stayed 25 mins after phlebotomy finished.

## 2015-06-05 NOTE — Progress Notes (Signed)
[facility] OFFICE PROGRESS NOTE  Keith Merino, MD [address] [name] [address]  DIAGNOSIS: Essential hypertension, benign - Plan: Comprehensive metabolic panel, likely secondary to smoking  Chief Complaint  Patient presents with  . Follow-up    polychthemia    CURRENT THERAPY: Phlebotomy to maintain hct less than 45%.    INTERVAL HISTORY: Keith Flores 55 y.o. male  who is here for further follow up for erythrocytosis.  He was last seen by Dr. Juliann Flores 6 month ago, and has not been followed due to his travel. Of note, he has an extensive cardiac history including CAD s/p CABG in 2010, Hypertension and hyperlipidemia for which he is being managed by cardiology. He had a lexiscan myoview recently due to test discomfort which was revealed the following: Low risk stress nuclear study with a moderate size, medium intensity, partially reversible inferior defect consistent with inferior thinning vs prior infarct and mild inferior ischemia. LV Ejection Fraction: 52%. LV Wall Motion: NL LV Function; NL Wall Motion. He was last seen by Dr. Sherren Flores 12/2012.   He denies being told he had an elevated hemoglobin on prior CBCs. Looking through his lab values, he has had significant fluctuations with his hct being 53.6 to as low as 39 on 12/30/2010 to as high as 57.7 on 01/10/2013. He also denied feeling very itchy after taking a shower or bathing. He denied burning pain in his feet or cyanosis or discolorations. He also denied chest and abdominal pain, myalgia and weakness, fatigue, headache, blurred vision, transient loss of vision, paresthesias, slow mentation. He denied a history of blood clots or gout. He smoked about 6 cigarettes daily and smoked for many years prior. He denied any dysuria or hematuria or weight changes. He denied living in high altitudes or the use of testosterone.   INTERIM HISTORY [name] returns for follow-up, he is accompanied by an interpreter.  He is doing well overall. He is concerned about his hyperglycemia, he sees his primary care physician for that. He had an episode of cellulitis in the left lower extremities a few months ago, resolved. No other new medical event since I saw him last time. He has been coming every 2 months for CBC and phlebotomy, he is tolerating phlebotomy well, but he has difficulty to access his vein sometime.   MEDICAL HISTORY: Past Medical History  Diagnosis Date  . Hypertension   . Diabetes mellitus   . Asthma   . Hyperlipidemia   . Coronary artery disease     CABG 12/12 (LIMA-LAD, SVG-DX, SVG-OM, SVG-PDA) c/b post op AFib Rx with amio Rx. s/p successful PCI of the right coronary artery utilizing overlapping drug-eluting stents in the mid and distal vessel (done after abnormal nuc)  . Chest pain     01/2011 in setting of HTN urgency: Chest CT demonstrated no pulmonary embolus, postoperative appearance following CABG and small left pleural effusion and dependent atelectasis in the left lung  . Elevated hemoglobin (HCC)      ALLERGIES:  has No Known Allergies.  MEDICATIONS: has a current medication list which includes the following prescription(s): amlodipine, aspirin, carvedilol, clopidogrel, enalapril, glimepiride, metformin, multivitamin-iron-minerals-folic acid, sildenafil, nitroglycerin, and pravastatin, and the following Facility-Administered Medications: sodium chloride.  SURGICAL HISTORY:  Past Surgical History  Procedure Laterality Date  . Cardiac catheterization    . Coronary artery bypass graft  12/30/2010    Procedure: CORONARY ARTERY BYPASS GRAFTING (CABG);  Surgeon: Grace Isaac, MD;  Location: [facility];  Service: Open Heart Surgery;  Laterality: N/A;  . Left heart catheterization with coronary/graft angiogram  03/07/2011    Procedure: LEFT HEART CATHETERIZATION WITH Beatrix Fetters;  Surgeon: Keith Mocha, MD;  Location: [facility] CATH LAB;  Service: Cardiovascular;;  . Percutaneous  coronary stent intervention (pci-s)  03/07/2011    Procedure: PERCUTANEOUS CORONARY STENT INTERVENTION (PCI-S);  Surgeon: Keith Mocha, MD;  Location: [facility] CATH LAB;  Service: Cardiovascular;;  . Left heart catheterization with coronary angiogram N/A 01/12/2013    Procedure: LEFT HEART CATHETERIZATION WITH CORONARY ANGIOGRAM;  Surgeon: Blane Ohara, MD;  Location: [facility] CATH LAB;  Service: Cardiovascular;  Laterality: N/A;    REVIEW OF SYSTEMS:   Constitutional: Denies fevers, chills or abnormal weight loss Eyes: Denies blurriness of vision Ears, nose, mouth, throat, and face: Denies mucositis or sore throat Respiratory: Denies cough, dyspnea or wheezes Cardiovascular: Denies palpitation, chest discomfort or lower extremity swelling Gastrointestinal:  Denies nausea, heartburn or change in bowel habits Skin: Denies abnormal skin rashes Lymphatics: Denies new lymphadenopathy or easy bruising Neurological:Denies numbness, tingling or new weaknesses Behavioral/Psych: Mood is stable, no new changes  All other systems were reviewed with the patient and are negative.  PHYSICAL EXAMINATION: ECOG PERFORMANCE STATUS: 0 - Asymptomatic  Blood pressure 132/73, pulse 81, temperature 98.3 F (36.8 C), temperature source Oral, resp. rate 18, height 5' 6" (1.676 m), weight 211 lb 4.8 oz (95.845 kg), SpO2 94 %.  GENERAL:alert, no distress and comfortable; moderately obese SKIN: skin color, texture, turgor are normal, no rashes or significant lesions EYES: normal, Conjunctiva are pink and non-injected, sclera clear OROPHARYNX:no exudate, no erythema and lips, buccal mucosa, and tongue normal  NECK: supple, thyroid normal size, non-tender, without nodularity LYMPH:  no palpable lymphadenopathy in the cervical, axillary or supraclavicular LUNGS: clear to auscultation and percussion with normal breathing effort HEART: regular rate & rhythm and no murmurs and no lower extremity edema ABDOMEN:abdomen soft,  non-tender and normal bowel sounds Musculoskeletal:no cyanosis of digits and no clubbing  NEURO: alert & oriented x 3 with fluent speech, no focal motor/sensory deficits  LABORATORY DATA: CBC Latest Ref Rng 06/05/2015 04/10/2015 02/13/2015  WBC 4.0 - 10.3 10e3/uL 5.7 5.4 6.3  Hemoglobin 13.0 - 17.1 g/dL 14.7 14.7 14.6  Hematocrit 38.4 - 49.9 % 46.6 47.4 47.0  Platelets 140 - 400 10e3/uL 191 223 222    CMP Latest Ref Rng 04/05/2015 03/21/2014 01/18/2014  Glucose 65 - 99 mg/dL 140(H) 133 144(H)  BUN 7 - 25 mg/dL 13 18.5 16.1  Creatinine 0.70 - 1.33 mg/dL 0.90 1.0 1.0  Sodium 135 - 146 mmol/L 138 140 139  Potassium 3.5 - 5.3 mmol/L 3.9 4.0 4.0  Chloride 98 - 110 mmol/L 104 - -  CO2 20 - 31 mmol/L _0 Calcium 8.6 - 10.3 mg/dL 9.5 9.7 9.5  Total Protein 6.1 - 8.1 g/dL 7.1 - 7.4  Total Bilirubin 0.2 - 1.2 mg/dL 0.7 - 1.47(H)  Alkaline Phos 40 - 115 U/L 73 - 69  AST 10 - 35 U/L 19 - 19  ALT 9 - 46 U/L 28 - 35   Erythropoietin  Status: Finalresult Visible to patient:  Not Released Nextappt: 02/01/2014 at 09:00 AM in [facility] Room) Dx:  Polycythemia, secondary         Ref Range 33yrago    Erythropoietin 2.6 - 18.5 mIU/mL 13.7      PATHOLOGY REPORT 02/01/2014 Diagnosis Bone Marrow, Aspirate,Biopsy, and Clot, left iliac crest - SLIGHTLY HYPERCELLULAR BONE  MARROW FOR AGE WITH TRILINEAGE HEMATOPOIESIS. - SEE COMMENT. PERIPHERAL BLOOD: - ERYTHROCYTOSIS. Diagnosis Note The overall findings are not considered specific or diagnostic of a myeloproliferative neoplasm. Correlation with cytogenetic and JAK-2 mutation analysis is recommended.  Cytogenetics: normal   RADIOGRAPHIC STUDIES: 05/20/2013 CT ABDOMEN WITH CONTRAST TECHNIQUE:  Multidetector CT imaging of the abdomen was performed using the standard protocol following bolus administration of intravenous contrast. CONTRAST: 165m OMNIPAQUE IOHEXOL 300 MG/ML SOLN  COMPARISON: 04/12/2006 FINDINGS: The  lung bases appear clear. No pleural or pericardial effusion.  No focal liver abnormality identified. The gallbladder is normal. There is no biliary dilatation. The spleen is unremarkable measuring 10.2 cm in length. The adrenal glands are both normal. The right kidney is normal. Cyst within the upper pole of the left kidney is identified measuring 1.2 cm. Normal caliber of the abdominal aorta. No aneurysm. There is no upper abdominal adenopathy identified. The stomach is normal. The small bowel loops are unremarkable. Normal appearance of the colon. Review of the visualized osseous structures is significant for mild spondylosis. IMPRESSION: 1. No acute findings identified within the upper abdomen. 2. No mass or adenopathy and no evidence for splenomegaly.    ASSESSMENT:  Emmett SAntonieta Pertis a 55y.o. male with a history of CAD/CABG presents with persistence of elevated (H/H) without thrombocytosis, leukocytosis. He also has an elevated vitamin b12. CT of Abdomen (2008) was negative for splenomegaly. JAK2 was negative. Normal EPO level. He denies classic aquagenic pruritus, negative for erythromegalalia. Secondary causes due to RTroy HHollisterand steroids/testerone are felt to be less likely, but he dose have extensive smoking history.  1.  Polycythemia, secondary to smoking  --He only needs one major WHO criteria for P vera, and does not meet any of the minor criteria. I reviewed his bone marrow biopsy with him, which does not support myeloproliferative disorder. JAK2 mutation was negative, his erythropoietin level was normal.  -I think his polycythemia is probable secondary to his extensive smoking history., Although he has never been hypoxic. -He had sleep study in July 2016. He was told by his primary care physicians that he has sleep apnea but does not need CPAP.  -Given his prior extensive history of coronary artery disease, I recommend continue phlebotomy to keep his hematocrit less than 45%. I do  not recommend Hydrea.  His hematocrit today is 46.6%, we'll do phlebotomy today  -He will continue daily baby aspirin (81 mg) daily.   2. CAD -continue follow up with his cardiologist   3. HTN, DM, hyperlipidemia -follow up with PCP    PLAN: -He is going out of country in 3 month, we'll check his CBC and schedule phlebotomy every 6 weeks, twice before he leaves. -I'll see him back at the end of November when he returns.  All questions were answered. The patient knows to call the clinic with any problems, questions or concerns. We can certainly see the patient much sooner if necessary.  I spent 15 minutes counseling the patient face to face in the presence of an arabic intepreter. The total time spent in the appointment was 20 minutes.    FTruitt Merle MD 06/05/2015

## 2015-06-05 NOTE — Telephone Encounter (Signed)
Gave pt apt & avs, pt requested the dates.

## 2015-06-05 NOTE — Patient Instructions (Signed)

## 2015-07-23 ENCOUNTER — Ambulatory Visit: Payer: Medicaid Other

## 2015-07-23 ENCOUNTER — Other Ambulatory Visit (HOSPITAL_BASED_OUTPATIENT_CLINIC_OR_DEPARTMENT_OTHER): Payer: Medicaid Other

## 2015-07-23 DIAGNOSIS — D751 Secondary polycythemia: Secondary | ICD-10-CM | POA: Diagnosis not present

## 2015-07-23 DIAGNOSIS — I10 Essential (primary) hypertension: Secondary | ICD-10-CM

## 2015-07-23 LAB — COMPREHENSIVE METABOLIC PANEL
ALBUMIN: 3.9 g/dL (ref 3.5–5.0)
ALK PHOS: 81 U/L (ref 40–150)
ALT: 35 U/L (ref 0–55)
AST: 21 U/L (ref 5–34)
Anion Gap: 10 mEq/L (ref 3–11)
BUN: 13.4 mg/dL (ref 7.0–26.0)
CO2: 26 mEq/L (ref 22–29)
Calcium: 9.5 mg/dL (ref 8.4–10.4)
Chloride: 101 mEq/L (ref 98–109)
Creatinine: 1 mg/dL (ref 0.7–1.3)
EGFR: 85 mL/min/{1.73_m2} — ABNORMAL LOW (ref >90)
GLUCOSE: 269 mg/dL — AB (ref 70–140)
POTASSIUM: 3.6 meq/L (ref 3.5–5.1)
SODIUM: 137 meq/L (ref 136–145)
TOTAL PROTEIN: 7.5 g/dL (ref 6.4–8.3)
Total Bilirubin: 0.77 mg/dL (ref 0.20–1.20)

## 2015-07-23 LAB — CBC WITH DIFFERENTIAL/PLATELET
BASO%: 1.1 % (ref 0.0–2.0)
Basophils Absolute: 0.1 10*3/uL (ref 0.0–0.1)
EOS%: 8.3 % — AB (ref 0.0–7.0)
Eosinophils Absolute: 0.5 10*3/uL (ref 0.0–0.5)
HCT: 49.1 % (ref 38.4–49.9)
HEMOGLOBIN: 15.4 g/dL (ref 13.0–17.1)
LYMPH%: 28.3 % (ref 14.0–49.0)
MCH: 23.3 pg — AB (ref 27.2–33.4)
MCHC: 31.4 g/dL — ABNORMAL LOW (ref 32.0–36.0)
MCV: 74.3 fL — ABNORMAL LOW (ref 79.3–98.0)
MONO#: 0.4 10*3/uL (ref 0.1–0.9)
MONO%: 6 % (ref 0.0–14.0)
NEUT%: 56.3 % (ref 39.0–75.0)
NEUTROS ABS: 3.3 10*3/uL (ref 1.5–6.5)
Platelets: 220 10*3/uL (ref 140–400)
RBC: 6.61 10*6/uL — ABNORMAL HIGH (ref 4.20–5.82)
RDW: 17.5 % — AB (ref 11.0–14.6)
WBC: 5.9 10*3/uL (ref 4.0–10.3)
lymph#: 1.7 10*3/uL (ref 0.9–3.3)

## 2015-07-23 NOTE — Patient Instructions (Signed)
[place] Discharge Instructions for Patients Receiving Chemotherapy  Today you received the following chemotherapy agents:  Cispatin  To help prevent nausea and vomiting after your treatment, we encourage you to take your nausea medication as prescribed.   If you develop nausea and vomiting that is not controlled by your nausea medication, call the clinic.   BELOW ARE SYMPTOMS THAT SHOULD BE REPORTED IMMEDIATELY:  *FEVER GREATER THAN 100.5 F  *CHILLS WITH OR WITHOUT FEVER  NAUSEA AND VOMITING THAT IS NOT CONTROLLED WITH YOUR NAUSEA MEDICATION  *UNUSUAL SHORTNESS OF BREATH  *UNUSUAL BRUISING OR BLEEDING  TENDERNESS IN MOUTH AND THROAT WITH OR WITHOUT PRESENCE OF ULCERS  *URINARY PROBLEMS  *BOWEL PROBLEMS  UNUSUAL RASH Items with * indicate a potential emergency and should be followed up as soon as possible.  Feel free to call the clinic you have any questions or concerns. The clinic phone number is [phone].  Please show the [place] at check-in to the Emergency Department and triage nurse.

## 2015-07-23 NOTE — Progress Notes (Signed)
Hct 49.1.  No phlebotomy needed today.  CMP within normal limits and VSS.  Pt reports no issues or concerns.  Pt discharged to home

## 2015-09-04 ENCOUNTER — Other Ambulatory Visit (HOSPITAL_BASED_OUTPATIENT_CLINIC_OR_DEPARTMENT_OTHER): Payer: Medicaid Other

## 2015-09-04 ENCOUNTER — Other Ambulatory Visit: Payer: Self-pay | Admitting: Hematology

## 2015-09-04 ENCOUNTER — Ambulatory Visit (HOSPITAL_BASED_OUTPATIENT_CLINIC_OR_DEPARTMENT_OTHER): Payer: Medicaid Other

## 2015-09-04 VITALS — BP 116/72 | HR 74 | Temp 99.1°F | Resp 17

## 2015-09-04 DIAGNOSIS — D751 Secondary polycythemia: Secondary | ICD-10-CM

## 2015-09-04 LAB — CBC WITH DIFFERENTIAL/PLATELET
BASO%: 0.7 % (ref 0.0–2.0)
Basophils Absolute: 0 10*3/uL (ref 0.0–0.1)
EOS%: 6.7 % (ref 0.0–7.0)
Eosinophils Absolute: 0.4 10*3/uL (ref 0.0–0.5)
HCT: 51.7 % — ABNORMAL HIGH (ref 38.4–49.9)
HGB: 16.3 g/dL (ref 13.0–17.1)
LYMPH%: 25.7 % (ref 14.0–49.0)
MCH: 24.1 pg — ABNORMAL LOW (ref 27.2–33.4)
MCHC: 31.5 g/dL — AB (ref 32.0–36.0)
MCV: 76.4 fL — ABNORMAL LOW (ref 79.3–98.0)
MONO#: 0.5 10*3/uL (ref 0.1–0.9)
MONO%: 6.8 % (ref 0.0–14.0)
NEUT%: 60.1 % (ref 39.0–75.0)
NEUTROS ABS: 4 10*3/uL (ref 1.5–6.5)
Platelets: 214 10*3/uL (ref 140–400)
RBC: 6.77 10*6/uL — AB (ref 4.20–5.82)
RDW: 19.5 % — ABNORMAL HIGH (ref 11.0–14.6)
WBC: 6.6 10*3/uL (ref 4.0–10.3)
lymph#: 1.7 10*3/uL (ref 0.9–3.3)

## 2015-09-04 NOTE — Progress Notes (Signed)
Therapeutic phlebotomy performed using a 16G starting at 1353 and ending at 1410 yielding 536ml. Patient tolerated well. Drink provided. Snack offered.

## 2015-09-04 NOTE — Patient Instructions (Signed)

## 2015-12-19 ENCOUNTER — Other Ambulatory Visit: Payer: Medicaid Other

## 2015-12-19 ENCOUNTER — Ambulatory Visit: Payer: Medicaid Other | Admitting: Hematology

## 2015-12-19 NOTE — Progress Notes (Deleted)
[facility] OFFICE PROGRESS NOTE  [name], MD [address] [name] [address]  DIAGNOSIS: No diagnosis found., likely secondary to smoking  No chief complaint on file.   CURRENT THERAPY: Phlebotomy to maintain hct less than 45%.    INTERVAL HISTORY: Keith Flores 55 y.o. male  who is here for further follow up for erythrocytosis.  He was last seen by Dr. Juliann Mule 6 month ago, and has not been followed due to his travel. Of note, he has an extensive cardiac history including CAD s/p CABG in 2010, Hypertension and hyperlipidemia for which he is being managed by cardiology. He had a lexiscan myoview recently due to test discomfort which was revealed the following: Low risk stress nuclear study with a moderate size, medium intensity, partially reversible inferior defect consistent with inferior thinning vs prior infarct and mild inferior ischemia. LV Ejection Fraction: 52%. LV Wall Motion: NL LV Function; NL Wall Motion. He was last seen by Dr. Sherren Mocha 12/2012.   He denies being told he had an elevated hemoglobin on prior CBCs. Looking through his lab values, he has had significant fluctuations with his hct being 53.6 to as low as 39 on 12/30/2010 to as high as 57.7 on 01/10/2013. He also denied feeling very itchy after taking a shower or bathing. He denied burning pain in his feet or cyanosis or discolorations. He also denied chest and abdominal pain, myalgia and weakness, fatigue, headache, blurred vision, transient loss of vision, paresthesias, slow mentation. He denied a history of blood clots or gout. He smoked about 6 cigarettes daily and smoked for many years prior. He denied any dysuria or hematuria or weight changes. He denied living in high altitudes or the use of testosterone.   INTERIM HISTORY Keith Flores returns for follow-up, he is accompanied by an interpreter. He is doing well overall. He is concerned about his hyperglycemia, he sees his primary care  physician for that. He had an episode of cellulitis in the left lower extremities a few months ago, resolved. No other new medical event since I saw him last time. He has been coming every 2 months for CBC and phlebotomy, he is tolerating phlebotomy well, but he has difficulty to access his vein sometime.   MEDICAL HISTORY: Past Medical History:  Diagnosis Date  . Asthma   . Chest pain    01/2011 in setting of HTN urgency: Chest CT demonstrated no pulmonary embolus, postoperative appearance following CABG and small left pleural effusion and dependent atelectasis in the left lung  . Coronary artery disease    CABG 12/12 (LIMA-LAD, SVG-DX, SVG-OM, SVG-PDA) c/b post op AFib Rx with amio Rx. s/p successful PCI of the right coronary artery utilizing overlapping drug-eluting stents in the mid and distal vessel (done after abnormal nuc)  . Diabetes mellitus   . Elevated hemoglobin ([place])   . Hyperlipidemia   . Hypertension      ALLERGIES:  has No Known Allergies.  MEDICATIONS: has a current medication list which includes the following prescription(s): amlodipine, aspirin, carvedilol, clopidogrel, enalapril, glimepiride, metformin, multivitamin-iron-minerals-folic acid, nitroglycerin, pravastatin, and sildenafil, and the following Facility-Administered Medications: sodium chloride.  SURGICAL HISTORY:  Past Surgical History:  Procedure Laterality Date  . CARDIAC CATHETERIZATION    . CORONARY ARTERY BYPASS GRAFT  12/30/2010   Procedure: CORONARY ARTERY BYPASS GRAFTING (CABG);  Surgeon: Grace Isaac, MD;  Location: [place];  Service: Open Heart Surgery;  Laterality: N/A;  . LEFT HEART CATHETERIZATION WITH CORONARY ANGIOGRAM N/A  01/12/2013   Procedure: LEFT HEART CATHETERIZATION WITH CORONARY ANGIOGRAM;  Surgeon: Blane Ohara, MD;  Location: [facility] CATH LAB;  Service: Cardiovascular;  Laterality: N/A;  . LEFT HEART CATHETERIZATION WITH CORONARY/GRAFT ANGIOGRAM  03/07/2011   Procedure: LEFT HEART  CATHETERIZATION WITH Beatrix Fetters;  Surgeon: Sherren Mocha, MD;  Location: [facility] CATH LAB;  Service: Cardiovascular;;  . PERCUTANEOUS CORONARY STENT INTERVENTION (PCI-S)  03/07/2011   Procedure: PERCUTANEOUS CORONARY STENT INTERVENTION (PCI-S);  Surgeon: Sherren Mocha, MD;  Location: [facility] CATH LAB;  Service: Cardiovascular;;    REVIEW OF SYSTEMS:   Constitutional: Denies fevers, chills or abnormal weight loss Eyes: Denies blurriness of vision Ears, nose, mouth, throat, and face: Denies mucositis or sore throat Respiratory: Denies cough, dyspnea or wheezes Cardiovascular: Denies palpitation, chest discomfort or lower extremity swelling Gastrointestinal:  Denies nausea, heartburn or change in bowel habits Skin: Denies abnormal skin rashes Lymphatics: Denies new lymphadenopathy or easy bruising Neurological:Denies numbness, tingling or new weaknesses Behavioral/Psych: Mood is stable, no new changes  All other systems were reviewed with the patient and are negative.  PHYSICAL EXAMINATION: ECOG PERFORMANCE STATUS: 0 - Asymptomatic  There were no vitals taken for this visit.  GENERAL:alert, no distress and comfortable; moderately obese SKIN: skin color, texture, turgor are normal, no rashes or significant lesions EYES: normal, Conjunctiva are pink and non-injected, sclera clear OROPHARYNX:no exudate, no erythema and lips, buccal mucosa, and tongue normal  NECK: supple, thyroid normal size, non-tender, without nodularity LYMPH:  no palpable lymphadenopathy in the cervical, axillary or supraclavicular LUNGS: clear to auscultation and percussion with normal breathing effort HEART: regular rate & rhythm and no murmurs and no lower extremity edema ABDOMEN:abdomen soft, non-tender and normal bowel sounds Musculoskeletal:no cyanosis of digits and no clubbing  NEURO: alert & oriented x 3 with fluent speech, no focal motor/sensory deficits  LABORATORY DATA: CBC Latest Ref Rng & Units  09/04/2015 07/23/2015 06/05/2015  WBC 4.0 - 10.3 10e3/uL 6.6 5.9 5.7  Hemoglobin 13.0 - 17.1 g/dL 16.3 15.4 14.7  Hematocrit 38.4 - 49.9 % 51.7(H) 49.1 46.6  Platelets 140 - 400 10e3/uL 214 220 191    CMP Latest Ref Rng & Units 07/23/2015 04/05/2015 03/21/2014  Glucose 70 - 140 mg/dl 269(H) 140(H) 133  BUN 7.0 - 26.0 mg/dL 13.4 13 18.5  Creatinine 0.7 - 1.3 mg/dL 1.0 0.90 1.0  Sodium 136 - 145 mEq/L 137 138 140  Potassium 3.5 - 5.1 mEq/L 3.6 3.9 4.0  Chloride 98 - 110 mmol/L - 104 -  CO2 22 - 29 mEq/L _0 Calcium 8.4 - 10.4 mg/dL 9.5 9.5 9.7  Total Protein 6.4 - 8.3 g/dL 7.5 7.1 -  Total Bilirubin 0.20 - 1.20 mg/dL 0.77 0.7 -  Alkaline Phos 40 - 150 U/L 81 73 -  AST 5 - 34 U/L 21 19 -  ALT 0 - 55 U/L 35 28 -   Erythropoietin  Status: Finalresult Visible to patient:  Not Released Nextappt: 02/01/2014 at 09:00 AM in No [facility] Room) Dx:  Polycythemia, secondary         Ref Range 32yrago    Erythropoietin 2.6 - 18.5 mIU/mL 13.7      PATHOLOGY REPORT 02/01/2014 Diagnosis Bone Marrow, Aspirate,Biopsy, and Clot, left iliac crest - SLIGHTLY HYPERCELLULAR BONE MARROW FOR AGE WITH TRILINEAGE HEMATOPOIESIS. - SEE COMMENT. PERIPHERAL BLOOD: - ERYTHROCYTOSIS. Diagnosis Note The overall findings are not considered specific or diagnostic of a myeloproliferative neoplasm. Correlation with cytogenetic and JAK-2 mutation analysis is recommended.  Cytogenetics: normal   RADIOGRAPHIC STUDIES: 05/20/2013 CT ABDOMEN WITH CONTRAST TECHNIQUE:  Multidetector CT imaging of the abdomen was performed using the standard protocol following bolus administration of intravenous contrast. CONTRAST: 139m OMNIPAQUE IOHEXOL 300 MG/ML SOLN  COMPARISON: 04/12/2006 FINDINGS: The lung bases appear clear. No pleural or pericardial effusion.  No focal liver abnormality identified. The gallbladder is normal. There is no biliary dilatation. The spleen is unremarkable measuring 10.2 cm  in length. The adrenal glands are both normal. The right kidney is normal. Cyst within the upper pole of the left kidney is identified measuring 1.2 cm. Normal caliber of the abdominal aorta. No aneurysm. There is no upper abdominal adenopathy identified. The stomach is normal. The small bowel loops are unremarkable. Normal appearance of the colon. Review of the visualized osseous structures is significant for mild spondylosis. IMPRESSION: 1. No acute findings identified within the upper abdomen. 2. No mass or adenopathy and no evidence for splenomegaly.    ASSESSMENT:  Tsuneo SAntonieta Pertis a 55y.o. male with a history of CAD/CABG presents with persistence of elevated (H/H) without thrombocytosis, leukocytosis. He also has an elevated vitamin b12. CT of Abdomen (2008) was negative for splenomegaly. JAK2 was negative. Normal EPO level. He denies classic aquagenic pruritus, negative for erythromegalalia. Secondary causes due to [place] steroids/testerone are felt to be less likely, but he dose have extensive smoking history.  1.  Polycythemia, secondary to smoking  --He only needs one major WHO criteria for P vera, and does not meet any of the minor criteria. I reviewed his bone marrow biopsy with him, which does not support myeloproliferative disorder. JAK2 mutation was negative, his erythropoietin level was normal.  -I think his polycythemia is probable secondary to his extensive smoking history., Although he has never been hypoxic. -He had sleep study in July 2016. He was told by his primary care physicians that he has sleep apnea but does not need CPAP.  -Given his prior extensive history of coronary artery disease, I recommend continue phlebotomy to keep his hematocrit less than 45%. I do not recommend Hydrea.  His hematocrit today is 46.6%, we'll do phlebotomy today  -He will continue daily baby aspirin (81 mg) daily.   2. CAD -continue follow up with his cardiologist   3. HTN, DM,  hyperlipidemia -follow up with PCP    PLAN: -He is going out of country in 3 month, we'll check his CBC and schedule phlebotomy every 6 weeks, twice before he leaves. -I'll see him back at the end of November when he returns.  All questions were answered. The patient knows to call the clinic with any problems, questions or concerns. We can certainly see the patient much sooner if necessary.  I spent 15 minutes counseling the patient face to face in the presence of an arabic intepreter. The total time spent in the appointment was 20 minutes.    FTruitt Merle MD 12/19/2015

## 2015-12-28 ENCOUNTER — Telehealth: Payer: Self-pay | Admitting: Hematology

## 2015-12-28 NOTE — Telephone Encounter (Signed)
lvm using interpreter service [phone] to inform pt of 12/18 appt date/time per LOS

## 2016-01-02 ENCOUNTER — Telehealth: Payer: Self-pay | Admitting: Cardiovascular Disease

## 2016-01-02 NOTE — Telephone Encounter (Signed)
Walk In Pt Form-pt Needs Refills on Meds-gave to Janett Billow to take around/Km

## 2016-01-04 ENCOUNTER — Other Ambulatory Visit: Payer: Self-pay

## 2016-01-04 MED ORDER — PRAVASTATIN SODIUM 40 MG PO TABS: 40.0000 mg | ORAL_TABLET | Freq: Every evening | ORAL | 3 refills | Status: DC

## 2016-01-04 MED ORDER — CARVEDILOL 25 MG PO TABS: 25.0000 mg | ORAL_TABLET | Freq: Two times a day (BID) | ORAL | 3 refills | Status: DC

## 2016-01-04 MED ORDER — ENALAPRIL MALEATE 10 MG PO TABS: 10.0000 mg | ORAL_TABLET | Freq: Two times a day (BID) | ORAL | 3 refills | Status: DC

## 2016-01-04 MED ORDER — ASPIRIN 81 MG PO TABS: 81.0000 mg | ORAL_TABLET | Freq: Every day | ORAL | 3 refills | Status: DC

## 2016-01-04 MED ORDER — NITROGLYCERIN 0.4 MG SL SUBL: 0.4000 mg | SUBLINGUAL_TABLET | SUBLINGUAL | 3 refills | Status: DC | PRN

## 2016-01-04 MED ORDER — CLOPIDOGREL BISULFATE 75 MG PO TABS: 75.0000 mg | ORAL_TABLET | Freq: Every day | ORAL | 3 refills | Status: DC

## 2016-01-07 ENCOUNTER — Ambulatory Visit (HOSPITAL_BASED_OUTPATIENT_CLINIC_OR_DEPARTMENT_OTHER): Payer: Self-pay | Admitting: Hematology

## 2016-01-07 ENCOUNTER — Encounter: Payer: Self-pay | Admitting: Hematology

## 2016-01-07 ENCOUNTER — Ambulatory Visit (HOSPITAL_BASED_OUTPATIENT_CLINIC_OR_DEPARTMENT_OTHER): Payer: Self-pay

## 2016-01-07 ENCOUNTER — Other Ambulatory Visit (HOSPITAL_BASED_OUTPATIENT_CLINIC_OR_DEPARTMENT_OTHER): Payer: Medicaid Other

## 2016-01-07 ENCOUNTER — Telehealth: Payer: Self-pay | Admitting: Hematology

## 2016-01-07 VITALS — BP 155/87 | HR 87 | Temp 98.2°F | Resp 18

## 2016-01-07 VITALS — BP 150/84 | HR 79 | Temp 98.4°F | Resp 16 | Ht 66.0 in | Wt 209.3 lb

## 2016-01-07 DIAGNOSIS — D751 Secondary polycythemia: Secondary | ICD-10-CM

## 2016-01-07 DIAGNOSIS — E119 Type 2 diabetes mellitus without complications: Secondary | ICD-10-CM

## 2016-01-07 DIAGNOSIS — I10 Essential (primary) hypertension: Secondary | ICD-10-CM

## 2016-01-07 LAB — CBC WITH DIFFERENTIAL/PLATELET
BASO%: 0.8 % (ref 0.0–2.0)
Basophils Absolute: 0.1 10*3/uL (ref 0.0–0.1)
EOS%: 3.6 % (ref 0.0–7.0)
Eosinophils Absolute: 0.3 10*3/uL (ref 0.0–0.5)
HCT: 53.5 % — ABNORMAL HIGH (ref 38.4–49.9)
HGB: 17.2 g/dL — ABNORMAL HIGH (ref 13.0–17.1)
LYMPH%: 26.6 % (ref 14.0–49.0)
MCH: 25.9 pg — ABNORMAL LOW (ref 27.2–33.4)
MCHC: 32.1 g/dL (ref 32.0–36.0)
MCV: 80.7 fL (ref 79.3–98.0)
MONO#: 0.5 10*3/uL (ref 0.1–0.9)
MONO%: 7.3 % (ref 0.0–14.0)
NEUT%: 61.7 % (ref 39.0–75.0)
NEUTROS ABS: 4.3 10*3/uL (ref 1.5–6.5)
Platelets: 251 10*3/uL (ref 140–400)
RBC: 6.64 10*6/uL — AB (ref 4.20–5.82)
RDW: 17.1 % — ABNORMAL HIGH (ref 11.0–14.6)
WBC: 7 10*3/uL (ref 4.0–10.3)
lymph#: 1.9 10*3/uL (ref 0.9–3.3)

## 2016-01-07 NOTE — Patient Instructions (Signed)

## 2016-01-07 NOTE — Progress Notes (Signed)
[facility] OFFICE PROGRESS NOTE  Keith Merino, MD [address] 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    INTERVAL HISTORY: Keith Flores 55 y.o. male  who is here for further follow up for erythrocytosis.  He was last seen by Dr. Juliann Mule 6 month ago, and has not been followed due to his travel. Of note, he has an extensive cardiac history including CAD s/p CABG in 2010, Hypertension and hyperlipidemia for which he is being managed by cardiology. He had a lexiscan myoview recently due to test discomfort which was revealed the following: Low risk stress nuclear study with a moderate size, medium intensity, partially reversible inferior defect consistent with inferior thinning vs prior infarct and mild inferior ischemia. LV Ejection Fraction: 52%. LV Wall Motion: NL LV Function; NL Wall Motion. He was last seen by Dr. Sherren Mocha 12/2012.   He denies being told he had an elevated hemoglobin on prior CBCs. Looking through his lab values, he has had significant fluctuations with his hct being 53.6 to as low as 39 on 12/30/2010 to as high as 57.7 on 01/10/2013. He also denied feeling very itchy after taking a shower or bathing. He denied burning pain in his feet or cyanosis or discolorations. He also denied chest and abdominal pain, myalgia and weakness, fatigue, headache, blurred vision, transient loss of vision, paresthesias, slow mentation. He denied a history of blood clots or gout. He smoked about 6 cigarettes daily and smoked for many years prior. He denied any dysuria or hematuria or weight changes. He denied living in high altitudes or the use of testosterone.   INTERIM HISTORY [name] returns for follow-up, he is accompanied by an interpreter. He is doing well overall. He has been having cough, yellowish sputum production for the past one and half months. He  came back from [place] 2 weeks ago, was seen by his primary care physician last week and was treated with antibiotics. He denies any fever or chest pain. He has chronic back pain, multiple medical comorbidities, and is applying for disability. His last phlebotomy was in August 2017.   MEDICAL HISTORY: Past Medical History:  Diagnosis Date  . Asthma   . Chest pain    01/2011 in setting of HTN urgency: Chest CT demonstrated no pulmonary embolus, postoperative appearance following CABG and small left pleural effusion and dependent atelectasis in the left lung  . Coronary artery disease    CABG 12/12 (LIMA-LAD, SVG-DX, SVG-OM, SVG-PDA) c/b post op AFib Rx with amio Rx. s/p successful PCI of the right coronary artery utilizing overlapping drug-eluting stents in the mid and distal vessel (done after abnormal nuc)  . Diabetes mellitus   . Elevated hemoglobin ([place])   . Hyperlipidemia   . Hypertension      ALLERGIES:  has No Known Allergies.  MEDICATIONS: has a current medication list which includes the following prescription(s): amlodipine, aspirin, carvedilol, clopidogrel, enalapril, glimepiride, metformin, multivitamin-iron-minerals-folic acid, nitroglycerin, pravastatin, and sildenafil, and the following Facility-Administered Medications: sodium chloride.  SURGICAL HISTORY:  Past Surgical History:  Procedure Laterality Date  . CARDIAC CATHETERIZATION    . CORONARY ARTERY BYPASS GRAFT  12/30/2010   Procedure: CORONARY ARTERY BYPASS GRAFTING (CABG);  Surgeon: Grace Isaac, MD;  Location: [place];  Service: Open Heart Surgery;  Laterality: N/A;  . LEFT HEART CATHETERIZATION WITH CORONARY ANGIOGRAM N/A 01/12/2013   Procedure: LEFT HEART CATHETERIZATION WITH  CORONARY ANGIOGRAM;  Surgeon: Blane Ohara, MD;  Location: [facility] CATH LAB;  Service: Cardiovascular;  Laterality: N/A;  . LEFT HEART CATHETERIZATION WITH CORONARY/GRAFT ANGIOGRAM  03/07/2011   Procedure: LEFT HEART CATHETERIZATION WITH  Beatrix Fetters;  Surgeon: Sherren Mocha, MD;  Location: [facility] CATH LAB;  Service: Cardiovascular;;  . PERCUTANEOUS CORONARY STENT INTERVENTION (PCI-S)  03/07/2011   Procedure: PERCUTANEOUS CORONARY STENT INTERVENTION (PCI-S);  Surgeon: Sherren Mocha, MD;  Location: [facility] CATH LAB;  Service: Cardiovascular;;    REVIEW OF SYSTEMS:   Constitutional: Denies fevers, chills or abnormal weight loss Eyes: Denies blurriness of vision Ears, nose, mouth, throat, and face: Denies mucositis or sore throat Respiratory: Denies cough, dyspnea or wheezes Cardiovascular: Denies palpitation, chest discomfort or lower extremity swelling Gastrointestinal:  Denies nausea, heartburn or change in bowel habits Skin: Denies abnormal skin rashes Lymphatics: Denies new lymphadenopathy or easy bruising Neurological:Denies numbness, tingling or new weaknesses Behavioral/Psych: Mood is stable, no new changes  All other systems were reviewed with the patient and are negative.  PHYSICAL EXAMINATION: ECOG PERFORMANCE STATUS: 0 - Asymptomatic  Blood pressure (!) 150/84, pulse 79, temperature 98.4 F (36.9 C), temperature source Oral, resp. rate 16, height '5\' 6"'$  (1.676 m), weight 209 lb 4.8 oz (94.9 kg), SpO2 96 %.  GENERAL:alert, no distress and comfortable; moderately obese SKIN: skin color, texture, turgor are normal, no rashes or significant lesions EYES: normal, Conjunctiva are pink and non-injected, sclera clear OROPHARYNX:no exudate, no erythema and lips, buccal mucosa, and tongue normal  NECK: supple, thyroid normal size, non-tender, without nodularity LYMPH:  no palpable lymphadenopathy in the cervical, axillary or supraclavicular LUNGS: clear to auscultation and percussion with normal breathing effort HEART: regular rate & rhythm and no murmurs and no lower extremity edema ABDOMEN:abdomen soft, non-tender and normal bowel sounds Musculoskeletal:no cyanosis of digits and no clubbing  NEURO: alert &  oriented x 3 with fluent speech, no focal motor/sensory deficits  LABORATORY DATA: CBC Latest Ref Rng & Units 01/07/2016 09/04/2015 07/23/2015  WBC 4.0 - 10.3 10e3/uL 7.0 6.6 5.9  Hemoglobin 13.0 - 17.1 g/dL 17.2(H) 16.3 15.4  Hematocrit 38.4 - 49.9 % 53.5(H) 51.7(H) 49.1  Platelets 140 - 400 10e3/uL 251 214 220    CMP Latest Ref Rng & Units 07/23/2015 04/05/2015 03/21/2014  Glucose 70 - 140 mg/dl 269(H) 140(H) 133  BUN 7.0 - 26.0 mg/dL 13.4 13 18.5  Creatinine 0.7 - 1.3 mg/dL 1.0 0.90 1.0  Sodium 136 - 145 mEq/L 137 138 140  Potassium 3.5 - 5.1 mEq/L 3.6 3.9 4.0  Chloride 98 - 110 mmol/L - 104 -  CO2 22 - 29 mEq/L '26 24 25  '$ Calcium 8.4 - 10.4 mg/dL 9.5 9.5 9.7  Total Protein 6.4 - 8.3 g/dL 7.5 7.1 -  Total Bilirubin 0.20 - 1.20 mg/dL 0.77 0.7 -  Alkaline Phos 40 - 150 U/L 81 73 -  AST 5 - 34 U/L 21 19 -  ALT 0 - 55 U/L 35 28 -   Erythropoietin  Status: Finalresult Visible to patient:  Not Released Nextappt: 02/01/2014 at 09:00 AM in No [facility] Room) Dx:  Polycythemia, secondary         Ref Range 73yrago    Erythropoietin 2.6 - 18.5 mIU/mL 13.7      PATHOLOGY REPORT 02/01/2014 Diagnosis Bone Marrow, Aspirate,Biopsy, and Clot, left iliac crest - SLIGHTLY HYPERCELLULAR BONE MARROW FOR AGE WITH TRILINEAGE HEMATOPOIESIS. - SEE COMMENT. PERIPHERAL BLOOD: - ERYTHROCYTOSIS. Diagnosis Note The overall findings are not considered specific  or diagnostic of a myeloproliferative neoplasm. Correlation with cytogenetic and JAK-2 mutation analysis is recommended.  Cytogenetics: normal   RADIOGRAPHIC STUDIES: 05/20/2013 CT ABDOMEN WITH CONTRAST TECHNIQUE:  Multidetector CT imaging of the abdomen was performed using the standard protocol following bolus administration of intravenous contrast. CONTRAST: 191m OMNIPAQUE IOHEXOL 300 MG/ML SOLN  COMPARISON: 04/12/2006 FINDINGS: The lung bases appear clear. No pleural or pericardial effusion.  No focal liver  abnormality identified. The gallbladder is normal. There is no biliary dilatation. The spleen is unremarkable measuring 10.2 cm in length. The adrenal glands are both normal. The right kidney is normal. Cyst within the upper pole of the left kidney is identified measuring 1.2 cm. Normal caliber of the abdominal aorta. No aneurysm. There is no upper abdominal adenopathy identified. The stomach is normal. The small bowel loops are unremarkable. Normal appearance of the colon. Review of the visualized osseous structures is significant for mild spondylosis. IMPRESSION: 1. No acute findings identified within the upper abdomen. 2. No mass or adenopathy and no evidence for splenomegaly.    ASSESSMENT:  Reagen SAntonieta Pertis a 55y.o. male with a history of CAD/CABG presents with persistence of elevated (H/H) without thrombocytosis, leukocytosis. He also has an elevated vitamin b12. CT of Abdomen (2008) was negative for splenomegaly. JAK2 was negative. Normal EPO level. He denies classic aquagenic pruritus, negative for erythromegalalia. Secondary causes due to [place]ora HS[place]and steroids/testerone are felt to be less likely, but he dose have extensive smoking history.  1.  Polycythemia, secondary to smoking  --He only needs one major WHO criteria for P vera, and does not meet any of the minor criteria. I reviewed his bone marrow biopsy with him, which does not support myeloproliferative disorder. JAK2 mutation was negative, his erythropoietin level was normal.  -I think his polycythemia is probable secondary to his extensive smoking history., Although he has never been hypoxic. -He had sleep study in July 2016. He was told by his primary care physicians that he has sleep apnea but does not need CPAP.  -Given his prior extensive history of coronary artery disease, I recommend continue phlebotomy to keep his hematocrit less than 45%. I do not recommend Hydrea.  His hematocrit today is 53.5%, we'll do phlebotomy  today, he was out of country for 3 months, and has not had a phlebotomy for 3 months. -He will continue daily baby aspirin (81 mg) daily.   2. CAD -continue follow up with his cardiologist   3. HTN, DM, hyperlipidemia -follow up with PCP    PLAN: -phlebotomy today and in 2 weeks -And phlebotomy appointment every 4 weeks after next visit, phlebotomy if hematocrit more than 45% -I will see him back in 3-4 months  -I'll assist his application for disability.  All questions were answered. The patient knows to call the clinic with any problems, questions or concerns. We can certainly see the patient much sooner if necessary.  I spent 15 minutes counseling the patient face to face in the presence of an arabic intepreter. The total time spent in the appointment was 20 minutes.    FTruitt Merle MD 01/07/2016

## 2016-01-07 NOTE — Telephone Encounter (Signed)
Gave patient avs report and appointments for January thru March.  °

## 2016-01-07 NOTE — Progress Notes (Signed)
Phlebotomy. 500 gm pulled from patient with 16g PIV to Left AC. Procedure started [id number] and ended at 1455. Patient given snack and encouraged fluids. Observed for 30 minutes afterwards. Vital Signs stable.

## 2016-01-23 ENCOUNTER — Other Ambulatory Visit: Payer: Self-pay | Admitting: *Deleted

## 2016-01-23 ENCOUNTER — Ambulatory Visit (HOSPITAL_BASED_OUTPATIENT_CLINIC_OR_DEPARTMENT_OTHER): Payer: Self-pay

## 2016-01-23 ENCOUNTER — Other Ambulatory Visit: Payer: Self-pay

## 2016-01-23 VITALS — BP 153/87 | HR 100 | Temp 98.3°F | Resp 18

## 2016-01-23 DIAGNOSIS — D751 Secondary polycythemia: Secondary | ICD-10-CM

## 2016-01-23 DIAGNOSIS — Z23 Encounter for immunization: Secondary | ICD-10-CM

## 2016-01-23 MED ORDER — INFLUENZA VAC SPLIT QUAD 0.5 ML IM SUSY
0.5000 mL | PREFILLED_SYRINGE | Freq: Once | INTRAMUSCULAR | Status: AC
Start: 2016-01-23 — End: 2016-01-23
  Administered 2016-01-23: 0.5 mL via INTRAMUSCULAR
  Filled 2016-01-23: qty 0.5

## 2016-01-23 NOTE — Patient Instructions (Signed)

## 2016-01-23 NOTE — Progress Notes (Signed)
Patient arrived for Phlebotomy 01/23/2016 based off of last set of lab values from        .  VSS stable prior.  Started at 1450 and finished at 1456,  Removed 534 ml from Right AC with 16G phlebotomy kit.  Patient offered nourishment and tolerated procedure well.  Vital signs stable at end of procedure.

## 2016-02-18 ENCOUNTER — Other Ambulatory Visit (HOSPITAL_BASED_OUTPATIENT_CLINIC_OR_DEPARTMENT_OTHER): Payer: Medicaid Other

## 2016-02-18 ENCOUNTER — Ambulatory Visit (HOSPITAL_BASED_OUTPATIENT_CLINIC_OR_DEPARTMENT_OTHER): Payer: Medicaid Other

## 2016-02-18 VITALS — BP 144/79 | HR 98 | Temp 98.4°F | Resp 18

## 2016-02-18 DIAGNOSIS — D751 Secondary polycythemia: Secondary | ICD-10-CM

## 2016-02-18 LAB — CBC WITH DIFFERENTIAL/PLATELET
BASO%: 0.6 % (ref 0.0–2.0)
Basophils Absolute: 0 10*3/uL (ref 0.0–0.1)
EOS%: 6.2 % (ref 0.0–7.0)
Eosinophils Absolute: 0.4 10*3/uL (ref 0.0–0.5)
HEMATOCRIT: 50.5 % — AB (ref 38.4–49.9)
HEMOGLOBIN: 16.6 g/dL (ref 13.0–17.1)
LYMPH#: 2 10*3/uL (ref 0.9–3.3)
LYMPH%: 29.9 % (ref 14.0–49.0)
MCH: 26.5 pg — AB (ref 27.2–33.4)
MCHC: 32.9 g/dL (ref 32.0–36.0)
MCV: 80.5 fL (ref 79.3–98.0)
MONO#: 0.6 10*3/uL (ref 0.1–0.9)
MONO%: 8.6 % (ref 0.0–14.0)
NEUT%: 54.7 % (ref 39.0–75.0)
NEUTROS ABS: 3.7 10*3/uL (ref 1.5–6.5)
Platelets: 238 10*3/uL (ref 140–400)
RBC: 6.27 10*6/uL — ABNORMAL HIGH (ref 4.20–5.82)
RDW: 14.8 % — AB (ref 11.0–14.6)
WBC: 6.7 10*3/uL (ref 4.0–10.3)

## 2016-02-18 NOTE — Patient Instructions (Signed)
Therapeutic Phlebotomy Therapeutic phlebotomy is the controlled removal of blood from a person's body for the purpose of treating a medical condition. The procedure is similar to donating blood. Usually, about a pint (470 mL, or 0.47L) of blood is removed. The average adult has 9-12 pints (4.3-5.7 L) of blood. Therapeutic phlebotomy may be used to treat the following medical conditions:  Hemochromatosis. This is a condition in which the blood contains too much iron.  Polycythemia vera. This is a condition in which the blood contains too many red blood cells.  Porphyria cutanea tarda. This is a disease in which an important part of hemoglobin is not made properly. It results in the buildup of abnormal amounts of porphyrins in the body.  Sickle cell disease. This is a condition in which the red blood cells form an abnormal crescent shape rather than a round shape. Tell a health care provider about:  Any allergies you have.  All medicines you are taking, including vitamins, herbs, eye drops, creams, and over-the-counter medicines.  Any problems you or family members have had with anesthetic medicines.  Any blood disorders you have.  Any surgeries you have had.  Any medical conditions you have. What are the risks? Generally, this is a safe procedure. However, problems may occur, including:  Nausea or light-headedness.  Low blood pressure.  Soreness, bleeding, swelling, or bruising at the needle insertion site.  Infection. What happens before the procedure?  Follow instructions from your health care provider about eating or drinking restrictions.  Ask your health care provider about changing or stopping your regular medicines. This is especially important if you are taking diabetes medicines or blood thinners.  Wear clothing with sleeves that can be raised above the elbow.  Plan to have someone take you home after the procedure.  You may have a blood sample taken. What happens  during the procedure?  A needle will be inserted into one of your veins.  Tubing and a collection bag will be attached to that needle.  Blood will flow through the needle and tubing into the collection bag.  You may be asked to open and close your hand slowly and continually during the entire collection.  After the specified amount of blood has been removed from your body, the collection bag and tubing will be clamped.  The needle will be removed from your vein.  Pressure will be held on the site of the needle insertion to stop the bleeding.  A bandage (dressing) will be placed over the needle insertion site. The procedure may vary among health care providers and hospitals. What happens after the procedure?  Your recovery will be assessed and monitored.  You can return to your normal activities as directed by your health care provider. This information is not intended to replace advice given to you by your health care provider. Make sure you discuss any questions you have with your health care provider. Document Released: 06/10/2010 Document Revised: 09/08/2015 Document Reviewed: 01/02/2014 Elsevier Interactive Patient Education  2017 Elsevier Inc.  

## 2016-02-18 NOTE — Progress Notes (Signed)
Phlebotomy began via 16G phlebotomy kit left AC @ 1408 and completed @ 1414. Patient tolerated well. Hemostasis at insertion site achieved and bandage applied.

## 2016-03-17 ENCOUNTER — Ambulatory Visit (HOSPITAL_BASED_OUTPATIENT_CLINIC_OR_DEPARTMENT_OTHER): Payer: Medicaid Other

## 2016-03-17 ENCOUNTER — Other Ambulatory Visit (HOSPITAL_BASED_OUTPATIENT_CLINIC_OR_DEPARTMENT_OTHER): Payer: Medicaid Other

## 2016-03-17 DIAGNOSIS — D751 Secondary polycythemia: Secondary | ICD-10-CM | POA: Diagnosis not present

## 2016-03-17 LAB — CBC WITH DIFFERENTIAL/PLATELET
BASO%: 0.8 % (ref 0.0–2.0)
Basophils Absolute: 0 10*3/uL (ref 0.0–0.1)
EOS ABS: 0.5 10*3/uL (ref 0.0–0.5)
EOS%: 8.3 % — ABNORMAL HIGH (ref 0.0–7.0)
HCT: 46.5 % (ref 38.4–49.9)
HGB: 15.3 g/dL (ref 13.0–17.1)
LYMPH%: 35.4 % (ref 14.0–49.0)
MCH: 26 pg — AB (ref 27.2–33.4)
MCHC: 33 g/dL (ref 32.0–36.0)
MCV: 78.7 fL — AB (ref 79.3–98.0)
MONO#: 0.4 10*3/uL (ref 0.1–0.9)
MONO%: 6.6 % (ref 0.0–14.0)
NEUT#: 3.2 10*3/uL (ref 1.5–6.5)
NEUT%: 48.9 % (ref 39.0–75.0)
PLATELETS: 228 10*3/uL (ref 140–400)
RBC: 5.9 10*6/uL — AB (ref 4.20–5.82)
RDW: 15.6 % — ABNORMAL HIGH (ref 11.0–14.6)
WBC: 6.5 10*3/uL (ref 4.0–10.3)
lymph#: 2.3 10*3/uL (ref 0.9–3.3)

## 2016-03-17 NOTE — Patient Instructions (Signed)
Therapeutic Phlebotomy Therapeutic phlebotomy is the controlled removal of blood from a person's body for the purpose of treating a medical condition. The procedure is similar to donating blood. Usually, about a pint (470 mL, or 0.47L) of blood is removed. The average adult has 9-12 pints (4.3-5.7 L) of blood. Therapeutic phlebotomy may be used to treat the following medical conditions:  Hemochromatosis. This is a condition in which the blood contains too much iron.  Polycythemia vera. This is a condition in which the blood contains too many red blood cells.  Porphyria cutanea tarda. This is a disease in which an important part of hemoglobin is not made properly. It results in the buildup of abnormal amounts of porphyrins in the body.  Sickle cell disease. This is a condition in which the red blood cells form an abnormal crescent shape rather than a round shape. Tell a health care provider about:  Any allergies you have.  All medicines you are taking, including vitamins, herbs, eye drops, creams, and over-the-counter medicines.  Any problems you or family members have had with anesthetic medicines.  Any blood disorders you have.  Any surgeries you have had.  Any medical conditions you have. What are the risks? Generally, this is a safe procedure. However, problems may occur, including:  Nausea or light-headedness.  Low blood pressure.  Soreness, bleeding, swelling, or bruising at the needle insertion site.  Infection. What happens before the procedure?  Follow instructions from your health care provider about eating or drinking restrictions.  Ask your health care provider about changing or stopping your regular medicines. This is especially important if you are taking diabetes medicines or blood thinners.  Wear clothing with sleeves that can be raised above the elbow.  Plan to have someone take you home after the procedure.  You may have a blood sample taken. What happens  during the procedure?  A needle will be inserted into one of your veins.  Tubing and a collection bag will be attached to that needle.  Blood will flow through the needle and tubing into the collection bag.  You may be asked to open and close your hand slowly and continually during the entire collection.  After the specified amount of blood has been removed from your body, the collection bag and tubing will be clamped.  The needle will be removed from your vein.  Pressure will be held on the site of the needle insertion to stop the bleeding.  A bandage (dressing) will be placed over the needle insertion site. The procedure may vary among health care providers and hospitals. What happens after the procedure?  Your recovery will be assessed and monitored.  You can return to your normal activities as directed by your health care provider. This information is not intended to replace advice given to you by your health care provider. Make sure you discuss any questions you have with your health care provider. Document Released: 06/10/2010 Document Revised: 09/08/2015 Document Reviewed: 01/02/2014 Elsevier Interactive Patient Education  2017 Elsevier Inc.  

## 2016-03-20 ENCOUNTER — Ambulatory Visit (INDEPENDENT_AMBULATORY_CARE_PROVIDER_SITE_OTHER): Payer: Medicaid Other | Admitting: Cardiovascular Disease

## 2016-03-20 ENCOUNTER — Encounter: Payer: Self-pay | Admitting: Cardiovascular Disease

## 2016-03-20 VITALS — BP 134/82 | HR 86 | Ht 66.0 in | Wt 206.8 lb

## 2016-03-20 DIAGNOSIS — I25118 Atherosclerotic heart disease of native coronary artery with other forms of angina pectoris: Secondary | ICD-10-CM | POA: Diagnosis not present

## 2016-03-20 DIAGNOSIS — N529 Male erectile dysfunction, unspecified: Secondary | ICD-10-CM

## 2016-03-20 MED ORDER — NITROGLYCERIN 0.4 MG SL SUBL: 0.4000 mg | SUBLINGUAL_TABLET | SUBLINGUAL | 3 refills | Status: AC | PRN

## 2016-03-20 MED ORDER — ENALAPRIL MALEATE 10 MG PO TABS: 10.0000 mg | ORAL_TABLET | Freq: Two times a day (BID) | ORAL | 3 refills | Status: DC

## 2016-03-20 MED ORDER — SILDENAFIL CITRATE 20 MG PO TABS: ORAL_TABLET | ORAL | 1 refills | Status: DC

## 2016-03-20 MED ORDER — CLOPIDOGREL BISULFATE 75 MG PO TABS: 75.0000 mg | ORAL_TABLET | Freq: Every day | ORAL | 3 refills | Status: DC

## 2016-03-20 MED ORDER — AMLODIPINE BESYLATE 10 MG PO TABS: 10.0000 mg | ORAL_TABLET | Freq: Every day | ORAL | 3 refills | Status: DC

## 2016-03-20 MED ORDER — PRAVASTATIN SODIUM 40 MG PO TABS: 40.0000 mg | ORAL_TABLET | Freq: Every evening | ORAL | 3 refills | Status: DC

## 2016-03-20 MED ORDER — CARVEDILOL 25 MG PO TABS: 25.0000 mg | ORAL_TABLET | Freq: Two times a day (BID) | ORAL | 3 refills | Status: DC

## 2016-03-20 NOTE — Progress Notes (Signed)
Cardiology Office Note Date:  03/20/2016   ID:  Keith Flores, DOB 04/24/1960, MRN [id number]  PCP:  [name], MD  Cardiologist:  Sherren Mocha, MD    No chief complaint on file.    History of Present Illness: Keith Flores is a 56 y.o. male who presents for follow-up of coronary artery disease.  He has coronary artery disease status post CABG in 2010. The patient had early graft closure involving the vein graft to right coronary artery and he underwent extensive stenting of the native RCA with a good result. He's followed for hypertension and hyperlipidemia as well.  The patient is here alone today. He is doing fairly well. He describes a sharp pinprick type pain in the left chest on occasion. This is fleeting in nature. He also feels the same sensation in his back. He walks regularly with no exertional symptoms. Specifically denies chest pressure, shortness of breath, or heart palpitations.  Past Medical History:  Diagnosis Date  . Asthma   . Chest pain    01/2011 in setting of HTN urgency: Chest CT demonstrated no pulmonary embolus, postoperative appearance following CABG and small left pleural effusion and dependent atelectasis in the left lung  . Coronary artery disease    CABG 12/12 (LIMA-LAD, SVG-DX, SVG-OM, SVG-PDA) c/b post op AFib Rx with amio Rx. s/p successful PCI of the right coronary artery utilizing overlapping drug-eluting stents in the mid and distal vessel (done after abnormal nuc)  . Diabetes mellitus   . Elevated hemoglobin ([place])   . Hyperlipidemia   . Hypertension     Past Surgical History:  Procedure Laterality Date  . CARDIAC CATHETERIZATION    . CORONARY ARTERY BYPASS GRAFT  12/30/2010   Procedure: CORONARY ARTERY BYPASS GRAFTING (CABG);  Surgeon: Grace Isaac, MD;  Location: [place];  Service: Open Heart Surgery;  Laterality: N/A;  . LEFT HEART CATHETERIZATION WITH CORONARY ANGIOGRAM N/A 01/12/2013   Procedure: LEFT HEART  CATHETERIZATION WITH CORONARY ANGIOGRAM;  Surgeon: Blane Ohara, MD;  Location[facility] C[facility] LAB;  Service: Cardiovascular;  Laterality: N/A;  . LEFT HEART CATHETERIZATION WITH CORONARY/GRAFT ANGIOGRAM  03/07/2011   Procedure: LEFT HEART CATHETERIZATION WITH Beatrix Fetters;  Surgeon: Sherren Mocha, MD;  Location: [facility] CATH LAB;  Service: Cardiovascular;;  . PERCUTANEOUS CORONARY STENT INTERVENTION (PCI-S)  03/07/2011   Procedure: PERCUTANEOUS CORONARY STENT INTERVENTION (PCI-S);  Surgeon: Sherren Mocha, MD;  Location: [facility] CATH LAB;  Service: Cardiovascular;;    Current Outpatient Prescriptions  Medication Sig Dispense Refill  . amLODipine (NORVASC) 10 MG tablet Take 1 tablet (10 mg total) by mouth daily. 90 tablet 3  . aspirin 81 MG tablet Take 1 tablet (81 mg total) by mouth daily. 30 tablet 3  . carvedilol (COREG) 25 MG tablet Take 1 tablet (25 mg total) by mouth 2 (two) times daily with a meal. 60 tablet 3  . clopidogrel (PLAVIX) 75 MG tablet Take 1 tablet (75 mg total) by mouth daily. 30 tablet 3  . enalapril (VASOTEC) 10 MG tablet Take 1 tablet (10 mg total) by mouth 2 (two) times daily. 60 tablet 3  . glimepiride (AMARYL) 4 MG tablet Take 4 mg by mouth daily with breakfast.    . metFORMIN (GLUCOPHAGE) 1000 MG tablet Take 1,000 mg by mouth 2 (two) times daily with a meal.    . multivitamin-iron-minerals-folic acid (CENTRUM) chewable tablet Chew 1 tablet by mouth daily with breakfast.    . nitroGLYCERIN (NITROSTAT) 0.4 MG SL tablet Place 1 tablet (  0.4 mg total) under the tongue every 5 (five) minutes as needed. For chest pain 25 tablet 3  . pravastatin (PRAVACHOL) 40 MG tablet Take 1 tablet (40 mg total) by mouth every evening. 30 tablet 3  . sildenafil (VIAGRA) 100 MG tablet Take as directed daily as needed prior to sexual activity 10 tablet 1   No current facility-administered medications for this visit.    Facility-Administered Medications Ordered in Other Visits  Medication Dose  Route Frequency Provider Last Rate Last Dose  . 0.9 %  sodium chloride infusion  250 mL Intravenous Continuous Truitt Merle, MD   Stopped at 07/03/14 1523    Allergies:   Patient has no known allergies.   Social History:  The patient  reports that he has been smoking.  He has never used smokeless tobacco. He reports that he does not drink alcohol or use drugs.   Family History:  The patient's  family history is not on file.    ROS:  Please see the history of present illness.  Otherwise, review of systems is positive for leg swelling. All other systems are reviewed and negative.    PHYSICAL EXAM: VS:  BP 134/82 (BP Location: Left Arm)   Pulse 86   Ht 5\' 6"  (1.676 m)   Wt 206 lb 12.8 oz (93.8 kg)   BMI 33.38 kg/m  , BMI Body mass index is 33.38 kg/m. GEN: Well nourished, well developed, in no acute distress  HEENT: normal  Neck: no JVD, no masses. No carotid bruits Cardiac: RRR without murmur or gallop                Respiratory:  clear to auscultation bilaterally, normal work of breathing GI: soft, nontender, nondistended, + BS MS: no deformity or atrophy  Ext: no pretibial edema, pedal pulses 2+= bilaterally Skin: warm and dry, no rash Neuro:  Strength and sensation are intact Psych: euthymic mood, full affect  EKG:  EKG is ordered today. The ekg ordered today shows normal sinus rhythm 86 bpm, possible left atrial enlargement, otherwise within normal limits.  Recent Labs: 07/23/2015: ALT 35; BUN 13.4; Creatinine 1.0; Potassium 3.6; Sodium 137 03/17/2016: HGB 15.3; Platelets 228   Lipid Panel     Component Value Date/Time   CHOL 109 (L) 04/05/2015 0852   TRIG 81 04/05/2015 0852   HDL 32 (L) 04/05/2015 0852   CHOLHDL 3.4 04/05/2015 0852   VLDL 16 04/05/2015 0852   LDLCALC 61 04/05/2015 0852      Wt Readings from Last 3 Encounters:  03/20/16 206 lb 12.8 oz (93.8 kg)  01/07/16 209 lb 4.8 oz (94.9 kg)  06/05/15 211 lb 4.8 oz (95.8 kg)     ASSESSMENT AND PLAN: 1.  CAD,  native vessel and bypass graft disease, with angina: The patient is having only atypical/fleeting symptoms of chest pain at present. His anginal symptoms appear to be well controlled on amlodipine and carvedilol. He should continue on long-term dual antiplatelet therapy as long as no bleeding problems arise. He has undergone extensive stenting of the native right coronary artery and his last catheterization demonstrated patency of his bypass grafts to the LAD and left circumflex distributions. LV function has been preserved. No medication changes are recommended today. I will see him back in one year for follow-up.  2. Hypertension: Blood pressure is well controlled on enalapril, carvedilol, and amlodipine  3. Hyperlipidemia: Treated with pravastatin 40 mg daily  4. Type 2 diabetes: Managed by his primary care physician.  Lifestyle modification reviewed with the patient today.  5. Erectile dysfunction: Prescription for sildenafil written.  Current medicines are reviewed with the patient today.  The patient does not have concerns regarding medicines.  Labs/ tests ordered today include:  No orders of the defined types were placed in this encounter.   Disposition:   FU one year  Signed, Sherren Mocha, MD  03/20/2016 3:15 PM    [facility] [address], [place], [place]  [address] Phone: [phone]; Fax: [phone]

## 2016-03-20 NOTE — Patient Instructions (Signed)
Medication Instructions:  Your physician recommends that you continue on your current medications as directed. Please refer to the Current Medication list given to you today.  Prescription given for sildenafil 20mg take 2-5 tablets by mouth as needed prior to sexual activity  Labwork: No new orders.   Testing/Procedures: No new orders.   Follow-Up: Your physician wants you to follow-up in: 1 YEAR with Dr Cooper.  You will receive a reminder letter in the mail two months in advance. If you don't receive a letter, please call our office to schedule the follow-up appointment.   Any Other Special Instructions Will Be Listed Below (If Applicable).     If you need a refill on your cardiac medications before your next appointment, please call your pharmacy.   

## 2016-04-07 NOTE — Progress Notes (Signed)
[facility] OFFICE PROGRESS NOTE  Keith Merino, MD [address] 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    INTERVAL HISTORY: Keith Flores 56 y.o. male  who is here for further follow up for erythrocytosis.  He was last seen by Keith Flores 6 month ago, and has not been followed due to his travel. Of note, he has an extensive cardiac history including CAD s/p CABG in 2010, Hypertension and hyperlipidemia for which he is being managed by cardiology. He had a lexiscan myoview recently due to test discomfort which was revealed the following: Low risk stress nuclear study with a moderate size, medium intensity, partially reversible inferior defect consistent with inferior thinning vs prior infarct and mild inferior ischemia. LV Ejection Fraction: 52%. LV Wall Motion: NL LV Function; NL Wall Motion. He was last seen by Keith Flores 12/2012.   He denies being told he had an elevated hemoglobin on prior CBCs. Looking through his lab values, he has had significant fluctuations with his hct being 53.6 to as low as 39 on 12/30/2010 to as high as 57.7 on 01/10/2013. He also denied feeling very itchy after taking a shower or bathing. He denied burning pain in his feet or cyanosis or discolorations. He also denied chest and abdominal pain, myalgia and weakness, fatigue, headache, blurred vision, transient loss of vision, paresthesias, slow mentation. He denied a history of blood clots or gout. He smoked about 6 cigarettes daily and smoked for many years prior. He denied any dysuria or hematuria or weight changes. He denied living in high altitudes or the use of testosterone.   INTERIM HISTORY Keith Flores returns for follow-up, he is accompanied by an interpreter. He is doing well today. He did well after his phlebotomy. He has previously had open heart surgery due to 4  blockages in his heart; he is concerned about his heart for the future. He quit smoking a few years ago; he uses a vape now with nicotine. Denies chest pain, easy bruising or bleeding, or any other concerns.   MEDICAL HISTORY: Past Medical History:  Diagnosis Date  . Asthma   . Chest pain    01/2011 in setting of HTN urgency: Chest CT demonstrated no pulmonary embolus, postoperative appearance following CABG and small left pleural effusion and dependent atelectasis in the left lung  . Coronary artery disease    CABG 12/12 (LIMA-LAD, SVG-DX, SVG-OM, SVG-PDA) c/b post op AFib Rx with amio Rx. s/p successful PCI of the right coronary artery utilizing overlapping drug-eluting stents in the mid and distal vessel (done after abnormal nuc)  . Diabetes mellitus   . Elevated hemoglobin ([place])   . Hyperlipidemia   . Hypertension    ALLERGIES:  has No Known Allergies.  MEDICATIONS: has a current medication list which includes the following prescription(s): amlodipine, aspirin, carvedilol, clopidogrel, enalapril, glimepiride, metformin, multivitamin-iron-minerals-folic acid, nitroglycerin, pravastatin, and sildenafil, and the following Facility-Administered Medications: sodium chloride.  SURGICAL HISTORY:  Past Surgical History:  Procedure Laterality Date  . CARDIAC CATHETERIZATION    . CORONARY ARTERY BYPASS GRAFT  12/30/2010   Procedure: CORONARY ARTERY BYPASS GRAFTING (CABG);  Surgeon: Keith Isaac, MD;  Location: [place];  Service: Open Heart Surgery;  Laterality: N/A;  . LEFT HEART CATHETERIZATION WITH CORONARY ANGIOGRAM N/A 01/12/2013   Procedure: LEFT HEART CATHETERIZATION WITH CORONARY ANGIOGRAM;  Surgeon: Keith Ohara, MD;  Location: [place] CATH LAB;  Service: Cardiovascular;  Laterality: N/A;  . LEFT HEART CATHETERIZATION WITH CORONARY/GRAFT ANGIOGRAM  03/07/2011   Procedure: LEFT HEART CATHETERIZATION WITH Beatrix Fetters;  Surgeon: Keith Mocha, MD;  Location: [facility] CATH LAB;   Service: Cardiovascular;;  . PERCUTANEOUS CORONARY STENT INTERVENTION (PCI-S)  03/07/2011   Procedure: PERCUTANEOUS CORONARY STENT INTERVENTION (PCI-S);  Surgeon: Keith Mocha, MD;  Location: [facility] CATH LAB;  Service: Cardiovascular;;    REVIEW OF SYSTEMS:   Constitutional: Denies fevers, chills or abnormal weight loss Eyes: Denies blurriness of vision Ears, nose, mouth, throat, and face: Denies mucositis or sore throat Respiratory: Denies cough, dyspnea or wheezes Cardiovascular: Denies palpitation, chest discomfort or lower extremity swelling Gastrointestinal:  Denies nausea, heartburn or change in bowel habits Skin: Denies abnormal skin rashes Lymphatics: Denies new lymphadenopathy or easy bruising Neurological:Denies numbness, tingling or new weaknesses Behavioral/Psych: Mood is stable, no new changes  All other systems were reviewed with the patient and are negative.  PHYSICAL EXAMINATION: ECOG PERFORMANCE STATUS: 0 - Asymptomatic  Blood pressure 135/77, pulse 76, temperature 98.1 F (36.7 C), temperature source Oral, resp. rate 17, height 5' 6" (1.676 m), weight 207 lb 6.4 oz (94.1 kg), SpO2 97 %. GENERAL:alert, no distress and comfortable; moderately obese SKIN: skin color, texture, turgor are normal, no rashes or significant lesions EYES: normal, Conjunctiva are pink and non-injected, sclera clear OROPHARYNX:no exudate, no erythema and lips, buccal mucosa, and tongue normal  NECK: supple, thyroid normal size, non-tender, without nodularity LYMPH:  no palpable lymphadenopathy in the cervical, axillary or supraclavicular LUNGS: clear to auscultation and percussion with normal breathing effort HEART: regular rate & rhythm and no murmurs and no lower extremity edema ABDOMEN:abdomen soft, non-tender and normal bowel sounds Musculoskeletal:no cyanosis of digits and no clubbing  NEURO: alert & oriented x 3 with fluent speech, no focal motor/sensory deficits  LABORATORY DATA: CBC  Latest Ref Rng & Units 04/14/2016 03/17/2016 02/18/2016  WBC 4.0 - 10.3 10e3/uL 6.3 6.5 6.7  Hemoglobin 13.0 - 17.1 g/dL 15.3 15.3 16.6  Hematocrit 38.4 - 49.9 % 47.5 46.5 50.5(H)  Platelets 140 - 400 10e3/uL 213 228 238    CMP Latest Ref Rng & Units 07/23/2015 04/05/2015 03/21/2014  Glucose 70 - 140 mg/dl 269(H) 140(H) 133  BUN 7.0 - 26.0 mg/dL 13.4 13 18.5  Creatinine 0.7 - 1.3 mg/dL 1.0 0.90 1.0  Sodium 136 - 145 mEq/L 137 138 140  Potassium 3.5 - 5.1 mEq/L 3.6 3.9 4.0  Chloride 98 - 110 mmol/L - 104 -  CO2 22 - 29 mEq/L _0 Calcium 8.4 - 10.4 mg/dL 9.5 9.5 9.7  Total Protein 6.4 - 8.3 g/dL 7.5 7.1 -  Total Bilirubin 0.20 - 1.20 mg/dL 0.77 0.7 -  Alkaline Phos 40 - 150 U/L 81 73 -  AST 5 - 34 U/L 21 19 -  ALT 0 - 55 U/L 35 28 -   Erythropoietin  Status: Finalresult Visible to patient:  Not Released Nextappt: 02/01/2014 at 09:00 AM in [facility] Room) Dx:  Polycythemia, secondary         Ref Range 71yrago    Erythropoietin 2.6 - 18.5 mIU/mL 13.7      PATHOLOGY REPORT 02/01/2014 Diagnosis Bone Marrow, Aspirate,Biopsy, and Clot, left iliac crest - SLIGHTLY HYPERCELLULAR BONE MARROW FOR AGE WITH TRILINEAGE HEMATOPOIESIS. - SEE COMMENT. PERIPHERAL BLOOD: - ERYTHROCYTOSIS. Diagnosis Note The overall findings are not considered specific or diagnostic of a myeloproliferative neoplasm. Correlation with cytogenetic and JAK-2  mutation analysis is recommended.  Cytogenetics: normal   RADIOGRAPHIC STUDIES: 05/20/2013 CT ABDOMEN WITH CONTRAST TECHNIQUE:  Multidetector CT imaging of the abdomen was performed using the standard protocol following bolus administration of intravenous contrast. CONTRAST: 170m OMNIPAQUE IOHEXOL 300 MG/ML SOLN  COMPARISON: 04/12/2006 FINDINGS: The lung bases appear clear. No pleural or pericardial effusion.  No focal liver abnormality identified. The gallbladder is normal. There is no biliary dilatation. The spleen is  unremarkable measuring 10.2 cm in length. The adrenal glands are both normal. The right kidney is normal. Cyst within the upper pole of the left kidney is identified measuring 1.2 cm. Normal caliber of the abdominal aorta. No aneurysm. There is no upper abdominal adenopathy identified. The stomach is normal. The small bowel loops are unremarkable. Normal appearance of the colon. Review of the visualized osseous structures is significant for mild spondylosis. IMPRESSION: 1. No acute findings identified within the upper abdomen. 2. No mass or adenopathy and no evidence for splenomegaly.    ASSESSMENT:  Antoine SAntonieta Pertis a 56y.o. male with a history of CAD/CABG presents with persistence of elevated (H/H) without thrombocytosis, leukocytosis. He also has an elevated vitamin b12. CT of Abdomen (2008) was negative for splenomegaly. JAK2 was negative. Normal EPO level. He denies classic aquagenic pruritus, negative for erythromegalalia. Secondary causes due to [place] steroids/testerone are felt to be less likely, but he dose have extensive smoking history.  1.  Polycythemia, secondary to smoking  --He only needs one major WHO criteria for P vera, and does not meet any of the minor criteria. I reviewed his bone marrow biopsy with him, which does not support myeloproliferative disorder. JAK2 mutation was negative, his erythropoietin level was normal.  -I think his polycythemia is probable secondary to his extensive smoking history., Although he has never been hypoxic. -He had sleep study in July 2016. He was told by his primary care physicians that he has sleep apnea but does not need CPAP.  -Given his prior extensive history of coronary artery disease, I recommend continue phlebotomy to keep his hematocrit less than 45%. I do not recommend Hydrea.  -His hematocrit today is 47.5%, we'll do phlebotomy today -He will continue daily baby aspirin (81 mg) daily.  -he has quit smoking completely but  dose use e- cigarettes. I have encouraged him to not vape with nicotine.  -He plans on traveling in September or October for an extended period of time.   2. CAD -continue follow up with his cardiologist Dr. CDellia Cloud 3. HTN, DM, hyperlipidemia -follow up with PCP   PLAN: -phlebotomy today and every months if hematocrit more than 45% -I will see him back in 6 months, will likely change his phlebotomy schedule to every 2 months in the future if he does not need monthly    All questions were answered. The patient knows to call the clinic with any problems, questions or concerns. We can certainly see the patient much sooner if necessary.  I spent 15 minutes counseling the patient face to face in the presence of an arabic intepreter. The total time spent in the appointment was 20 minutes.  This document serves as a record of services personally performed by YTruitt Merle MD. It was created on her behalf by [email or web address], a trained medical scribe. The creation of this record is based on the scribe's personal observations and the provider's statements to them. This document has been checked and approved by the attending provider.  I have  reviewed the above documentation for accuracy and completeness and I agree with the above.  Truitt Merle, MD 04/14/2016

## 2016-04-14 ENCOUNTER — Ambulatory Visit (HOSPITAL_BASED_OUTPATIENT_CLINIC_OR_DEPARTMENT_OTHER): Payer: Medicaid Other

## 2016-04-14 ENCOUNTER — Telehealth: Payer: Self-pay | Admitting: Hematology

## 2016-04-14 ENCOUNTER — Encounter: Payer: Self-pay | Admitting: Hematology

## 2016-04-14 ENCOUNTER — Other Ambulatory Visit (HOSPITAL_BASED_OUTPATIENT_CLINIC_OR_DEPARTMENT_OTHER): Payer: Medicaid Other

## 2016-04-14 ENCOUNTER — Ambulatory Visit (HOSPITAL_BASED_OUTPATIENT_CLINIC_OR_DEPARTMENT_OTHER): Payer: Medicaid Other | Admitting: Hematology

## 2016-04-14 VITALS — BP 127/74 | HR 75 | Temp 98.2°F | Resp 18

## 2016-04-14 VITALS — BP 135/77 | HR 76 | Temp 98.1°F | Resp 17 | Ht 66.0 in | Wt 207.4 lb

## 2016-04-14 DIAGNOSIS — E119 Type 2 diabetes mellitus without complications: Secondary | ICD-10-CM

## 2016-04-14 DIAGNOSIS — Z720 Tobacco use: Secondary | ICD-10-CM | POA: Diagnosis not present

## 2016-04-14 DIAGNOSIS — D751 Secondary polycythemia: Secondary | ICD-10-CM

## 2016-04-14 DIAGNOSIS — I10 Essential (primary) hypertension: Secondary | ICD-10-CM | POA: Diagnosis not present

## 2016-04-14 DIAGNOSIS — I2510 Atherosclerotic heart disease of native coronary artery without angina pectoris: Secondary | ICD-10-CM

## 2016-04-14 LAB — CBC WITH DIFFERENTIAL/PLATELET
BASO%: 0.6 % (ref 0.0–2.0)
Basophils Absolute: 0 10*3/uL (ref 0.0–0.1)
EOS ABS: 0.6 10*3/uL — AB (ref 0.0–0.5)
EOS%: 8.9 % — ABNORMAL HIGH (ref 0.0–7.0)
HEMATOCRIT: 47.5 % (ref 38.4–49.9)
HGB: 15.3 g/dL (ref 13.0–17.1)
LYMPH#: 1.8 10*3/uL (ref 0.9–3.3)
LYMPH%: 29.2 % (ref 14.0–49.0)
MCH: 24.9 pg — ABNORMAL LOW (ref 27.2–33.4)
MCHC: 32.2 g/dL (ref 32.0–36.0)
MCV: 77.4 fL — AB (ref 79.3–98.0)
MONO#: 0.5 10*3/uL (ref 0.1–0.9)
MONO%: 8 % (ref 0.0–14.0)
NEUT%: 53.3 % (ref 39.0–75.0)
NEUTROS ABS: 3.3 10*3/uL (ref 1.5–6.5)
PLATELETS: 213 10*3/uL (ref 140–400)
RBC: 6.14 10*6/uL — ABNORMAL HIGH (ref 4.20–5.82)
RDW: 14.4 % (ref 11.0–14.6)
WBC: 6.3 10*3/uL (ref 4.0–10.3)

## 2016-04-14 NOTE — Telephone Encounter (Signed)
Appointments complete per 3/26 los. Patient to get print out in infusion.

## 2016-04-14 NOTE — Progress Notes (Signed)
Patient stated he has not eaten all day. Upon arrival to treatment area patient began to eat chicken soup, cheese, crackers and apple juice. Will begin phlebotomy '@15'$  minutes after he has finished eating. Started phlebotomy at 1538 with phlebotomy 16G kit ended at 1548 with a total of 530g out. Pt tolerated well. VS after are stable. Patient offered food and drink after, and monitored for 30 minutes.

## 2016-04-14 NOTE — Patient Instructions (Signed)
Therapeutic Phlebotomy Therapeutic phlebotomy is the controlled removal of blood from a person's body for the purpose of treating a medical condition. The procedure is similar to donating blood. Usually, about a pint (470 mL, or 0.47L) of blood is removed. The average adult has 9-12 pints (4.3-5.7 L) of blood. Therapeutic phlebotomy may be used to treat the following medical conditions:  Hemochromatosis. This is a condition in which the blood contains too much iron.  Polycythemia vera. This is a condition in which the blood contains too many red blood cells.  Porphyria cutanea tarda. This is a disease in which an important part of hemoglobin is not made properly. It results in the buildup of abnormal amounts of porphyrins in the body.  Sickle cell disease. This is a condition in which the red blood cells form an abnormal crescent shape rather than a round shape. Tell a health care provider about:  Any allergies you have.  All medicines you are taking, including vitamins, herbs, eye drops, creams, and over-the-counter medicines.  Any problems you or family members have had with anesthetic medicines.  Any blood disorders you have.  Any surgeries you have had.  Any medical conditions you have. What are the risks? Generally, this is a safe procedure. However, problems may occur, including:  Nausea or light-headedness.  Low blood pressure.  Soreness, bleeding, swelling, or bruising at the needle insertion site.  Infection. What happens before the procedure?  Follow instructions from your health care provider about eating or drinking restrictions.  Ask your health care provider about changing or stopping your regular medicines. This is especially important if you are taking diabetes medicines or blood thinners.  Wear clothing with sleeves that can be raised above the elbow.  Plan to have someone take you home after the procedure.  You may have a blood sample taken. What happens  during the procedure?  A needle will be inserted into one of your veins.  Tubing and a collection bag will be attached to that needle.  Blood will flow through the needle and tubing into the collection bag.  You may be asked to open and close your hand slowly and continually during the entire collection.  After the specified amount of blood has been removed from your body, the collection bag and tubing will be clamped.  The needle will be removed from your vein.  Pressure will be held on the site of the needle insertion to stop the bleeding.  A bandage (dressing) will be placed over the needle insertion site. The procedure may vary among health care providers and hospitals. What happens after the procedure?  Your recovery will be assessed and monitored.  You can return to your normal activities as directed by your health care provider. This information is not intended to replace advice given to you by your health care provider. Make sure you discuss any questions you have with your health care provider. Document Released: 06/10/2010 Document Revised: 09/08/2015 Document Reviewed: 01/02/2014 Elsevier Interactive Patient Education  2017 Elsevier Inc.  

## 2016-05-06 ENCOUNTER — Encounter: Payer: Self-pay | Admitting: Podiatry

## 2016-05-06 ENCOUNTER — Ambulatory Visit (INDEPENDENT_AMBULATORY_CARE_PROVIDER_SITE_OTHER): Payer: Medicaid Other | Admitting: Podiatry

## 2016-05-06 VITALS — BP 160/85 | HR 103

## 2016-05-06 DIAGNOSIS — M79676 Pain in unspecified toe(s): Secondary | ICD-10-CM | POA: Diagnosis not present

## 2016-05-06 DIAGNOSIS — B351 Tinea unguium: Secondary | ICD-10-CM

## 2016-05-06 NOTE — Progress Notes (Signed)
   Subjective:    Patient ID: Keith Flores, male    DOB: 1960-09-14, 56 y.o.   MRN: [id number]  HPI this patient presents to the office stating that he is having pain and problems with the big toe of the right foot. He states that there is pain as he walks. He also says that a second nail is growing under the big nail on the right. He denies any drainage or pus from under the nail. This patient is diabetic and is taking Plavix. He presents the office today for an evaluation and treatment of this painful nail    Review of Systems  All other systems reviewed and are negative.      Objective:   Physical Exam GENERAL APPEARANCE: Alert, conversant. Appropriately groomed. No acute distress.  VASCULAR: Pedal pulses are  palpable at  [facility] and PT bilateral.  Capillary refill time is immediate to all digits,  Normal temperature gradient.  Digital hair growth is present bilateral  NEUROLOGIC: sensation is normal to 5.07 monofilament at 5/5 sites bilateral.  Light touch is intact bilateral, Muscle strength normal.  MUSCULOSKELETAL: acceptable muscle strength, tone and stability bilateral.  Intrinsic muscluature intact bilateral.  Rectus appearance of foot and digits noted bilateral.   DERMATOLOGIC: skin color, texture, and turgor are within normal limits.  No preulcerative lesions or ulcers  are seen, no interdigital maceration noted.  No open lesions present.   No drainage noted.  NAILS  Thick disfigured discolored hallux nail which is unattached at proximal nail fold.  The lateral aspect of hallux nail plate is unattached to nail bed.            Assessment & Plan:  Onychomycosis/  Traumatized nail plate right hallux.   IE  Discussed this condition with this patient. Explained to him through his interpreter that he has a deceased nail that is unattached from the nailbed. I gave him the option of conservative treatment which involves debridement of the nail first vs.  surgical treatment  which involves permanent removal of the right nail plate.  This patient states he desired surgical treatment at this time. I would be evaluated his condition and this patient is diabetic, but he does say it is under control. Further evaluation of his medicines reveal he is on Plavix.  I informed him that he needs to stop the Plavix. 3 day prior to the scheduled nail surgery that he desires. He is scheduled surgery as he believes this office and surgery will be performed when he returns.  RTC prn.   Gardiner Barefoot DPM

## 2016-05-09 ENCOUNTER — Encounter: Payer: Self-pay | Admitting: Podiatry

## 2016-05-09 ENCOUNTER — Ambulatory Visit (INDEPENDENT_AMBULATORY_CARE_PROVIDER_SITE_OTHER): Payer: Medicaid Other | Admitting: Podiatry

## 2016-05-09 DIAGNOSIS — M79676 Pain in unspecified toe(s): Principal | ICD-10-CM

## 2016-05-09 DIAGNOSIS — B351 Tinea unguium: Secondary | ICD-10-CM

## 2016-05-09 NOTE — Progress Notes (Signed)
   Subjective:    Patient ID: Keith Flores, male    DOB: 05-30-1960, 56 y.o.   MRN: [id number]  HPI this patient presents to the office stating that he is having pain and problems with the big toenail right foot. He was seen on Tuesday and we considered surgery for the permanent removal of the right great toenail. This patient was taking Plavix so therefore we told him to discontinue his medicine and return to the office today for nail surgery. He presents the office today for scheduled nail surgery for the permanent removal of the right great toenail    Review of Systems  All other systems reviewed and are negative.      Objective:   Physical Exam GENERAL APPEARANCE: Alert, conversant. Appropriately groomed. No acute distress.  VASCULAR: Pedal pulses are  palpable at  [facility] and PT bilateral.  Capillary refill time is immediate to all digits,  Normal temperature gradient.  Digital hair growth is present bilateral  NEUROLOGIC: sensation is normal to 5.07 monofilament at 5/5 sites bilateral.  Light touch is intact bilateral, Muscle strength normal.  MUSCULOSKELETAL: acceptable muscle strength, tone and stability bilateral.  Intrinsic muscluature intact bilateral.  Rectus appearance of foot and digits noted bilateral.   DERMATOLOGIC: skin color, texture, and turgor are within normal limits.  No preulcerative lesions or ulcers  are seen, no interdigital maceration noted.  No open lesions present.   No drainage noted.  NAILS  Thick disfigured discolored hallux nail which is unattached at proximal nail fold.  The lateral aspect of hallux nail plate is unattached to nail bed.            Assessment & Plan:  Onychomycosis/  Traumatized nail plate right hallux   ROV  Nail surgery.  Treatment options and alternatives discussed.  Recommended permanent phenol matrixectomy and patient agreed.  Right hallux  was prepped with alcohol and a toe block of 3cc of 2% lidocaine plain was  administered in a digital toe block. .  The toe was then prepped with betadine solution .  The offending nail border was then excised and matrix tissue exposed.  Phenol was then applied to the matrix tissue followed by an alcohol wash.  Antibiotic ointment and a dry sterile dressing was applied.  The patient was dispensed instructions for aftercare.  RTC 1 week.     Gardiner Barefoot DPM    Gardiner Barefoot DPM

## 2016-05-12 ENCOUNTER — Other Ambulatory Visit (HOSPITAL_BASED_OUTPATIENT_CLINIC_OR_DEPARTMENT_OTHER): Payer: Medicaid Other

## 2016-05-12 ENCOUNTER — Ambulatory Visit (HOSPITAL_BASED_OUTPATIENT_CLINIC_OR_DEPARTMENT_OTHER): Payer: Medicaid Other

## 2016-05-12 DIAGNOSIS — D751 Secondary polycythemia: Secondary | ICD-10-CM

## 2016-05-12 LAB — CBC WITH DIFFERENTIAL/PLATELET
BASO%: 1 % (ref 0.0–2.0)
BASOS ABS: 0.1 10*3/uL (ref 0.0–0.1)
EOS%: 7.2 % — AB (ref 0.0–7.0)
Eosinophils Absolute: 0.5 10*3/uL (ref 0.0–0.5)
HEMATOCRIT: 46.3 % (ref 38.4–49.9)
HEMOGLOBIN: 14.7 g/dL (ref 13.0–17.1)
LYMPH#: 2.1 10*3/uL (ref 0.9–3.3)
LYMPH%: 33.2 % (ref 14.0–49.0)
MCH: 23.6 pg — ABNORMAL LOW (ref 27.2–33.4)
MCHC: 31.6 g/dL — ABNORMAL LOW (ref 32.0–36.0)
MCV: 74.6 fL — ABNORMAL LOW (ref 79.3–98.0)
MONO#: 0.3 10*3/uL (ref 0.1–0.9)
MONO%: 5.1 % (ref 0.0–14.0)
NEUT%: 53.5 % (ref 39.0–75.0)
NEUTROS ABS: 3.4 10*3/uL (ref 1.5–6.5)
Platelets: 227 10*3/uL (ref 140–400)
RBC: 6.21 10*6/uL — ABNORMAL HIGH (ref 4.20–5.82)
RDW: 16.2 % — AB (ref 11.0–14.6)
WBC: 6.4 10*3/uL (ref 4.0–10.3)

## 2016-05-12 NOTE — Progress Notes (Signed)
@  1402 a 16 G phlebotomy kit was used. 571 grams taken. finished phlebotomy at 1416. Pt tolerated well, offered food and drink after, pt achieved homeostasis, monitored for 30 minutes post phlebotomy and VSS at discharge 

## 2016-05-12 NOTE — Patient Instructions (Signed)
Therapeutic Phlebotomy Therapeutic phlebotomy is the controlled removal of blood from a person's body for the purpose of treating a medical condition. The procedure is similar to donating blood. Usually, about a pint (470 mL, or 0.47L) of blood is removed. The average adult has 9-12 pints (4.3-5.7 L) of blood. Therapeutic phlebotomy may be used to treat the following medical conditions:  Hemochromatosis. This is a condition in which the blood contains too much iron.  Polycythemia vera. This is a condition in which the blood contains too many red blood cells.  Porphyria cutanea tarda. This is a disease in which an important part of hemoglobin is not made properly. It results in the buildup of abnormal amounts of porphyrins in the body.  Sickle cell disease. This is a condition in which the red blood cells form an abnormal crescent shape rather than a round shape. Tell a health care provider about:  Any allergies you have.  All medicines you are taking, including vitamins, herbs, eye drops, creams, and over-the-counter medicines.  Any problems you or family members have had with anesthetic medicines.  Any blood disorders you have.  Any surgeries you have had.  Any medical conditions you have. What are the risks? Generally, this is a safe procedure. However, problems may occur, including:  Nausea or light-headedness.  Low blood pressure.  Soreness, bleeding, swelling, or bruising at the needle insertion site.  Infection. What happens before the procedure?  Follow instructions from your health care provider about eating or drinking restrictions.  Ask your health care provider about changing or stopping your regular medicines. This is especially important if you are taking diabetes medicines or blood thinners.  Wear clothing with sleeves that can be raised above the elbow.  Plan to have someone take you home after the procedure.  You may have a blood sample taken. What happens  during the procedure?  A needle will be inserted into one of your veins.  Tubing and a collection bag will be attached to that needle.  Blood will flow through the needle and tubing into the collection bag.  You may be asked to open and close your hand slowly and continually during the entire collection.  After the specified amount of blood has been removed from your body, the collection bag and tubing will be clamped.  The needle will be removed from your vein.  Pressure will be held on the site of the needle insertion to stop the bleeding.  A bandage (dressing) will be placed over the needle insertion site. The procedure may vary among health care providers and hospitals. What happens after the procedure?  Your recovery will be assessed and monitored.  You can return to your normal activities as directed by your health care provider. This information is not intended to replace advice given to you by your health care provider. Make sure you discuss any questions you have with your health care provider. Document Released: 06/10/2010 Document Revised: 09/08/2015 Document Reviewed: 01/02/2014 Elsevier Interactive Patient Education  2017 Elsevier Inc.  

## 2016-05-16 ENCOUNTER — Ambulatory Visit (INDEPENDENT_AMBULATORY_CARE_PROVIDER_SITE_OTHER): Payer: Self-pay | Admitting: Podiatry

## 2016-05-16 DIAGNOSIS — Z09 Encounter for follow-up examination after completed treatment for conditions other than malignant neoplasm: Secondary | ICD-10-CM

## 2016-05-16 MED ORDER — CEPHALEXIN 500 MG PO CAPS: 500.0000 mg | ORAL_CAPSULE | Freq: Three times a day (TID) | ORAL | 0 refills | Status: DC

## 2016-05-16 NOTE — Progress Notes (Signed)
This patient returns to the office 1 week after nail surgery for the removal of the big toenail, right foot. He says that the toe has been painful and he is still wearing sandals. He says he has been soaking his foot and bandaging her toe as directed. Examination of the toe reveals significant hematoma noted on the nailbed consistent with being on Plavix orally. He also has pain noted at the proximal nail fold from pressure. He also has redness and swelling noted around the inside and outside of the nail site. He presents the office today for evaluation and treatment  Neurovascular status is the same as 05/09/2016. Examination of the right hallux reveal redness, swelling and increased temperature noted around the nailbed. The the tissue at the site of the nail bed has prevented drainage from the under the proximal nail fold  Dx  S/P nail surgery with probable infection. Right hallux.  ROV  Debridement  of the necrotic tissue at the proximal nail fold.  The proximal nail fold was separated from the nailbed to allow drainage to occur. Patient was told to continue soaks and bandaging. Return to the clinic in one week. This patient did not bring his interpreter since he says he can converse with me.  I found explaining my treatment plan to him very difficult and I am not sure he completely understood my directions.  Prescribed cephalexin for this patient, 1 3 times a day 10 days. Return to the clinic in 10 days for further evaluation and treatment

## 2016-05-27 ENCOUNTER — Ambulatory Visit: Payer: Medicaid Other | Admitting: Podiatry

## 2016-05-28 ENCOUNTER — Encounter: Payer: Self-pay | Admitting: Podiatry

## 2016-05-28 ENCOUNTER — Ambulatory Visit (INDEPENDENT_AMBULATORY_CARE_PROVIDER_SITE_OTHER): Payer: Self-pay | Admitting: Podiatry

## 2016-05-28 DIAGNOSIS — Z09 Encounter for follow-up examination after completed treatment for conditions other than malignant neoplasm: Secondary | ICD-10-CM

## 2016-05-28 NOTE — Progress Notes (Signed)
This patient returns to the office  after nail surgery for the removal of the big toenail, right foot. He says that the toe has been improving but he is still wearing sandals. He says he has been soaking his foot and bandaging her toe as directed. He says he has concerns about the blackened area on the nail bed.. He also has pain noted at the proximal nail fold from pressure. He also has redness  but the toe does not have increased temperature.Keith Flores He presents the office today for evaluation and treatment  Neurovascular status is the same as 05/09/2016. Examination of the right hallux reveal redness, swelling but decreased  temperature noted around the nailbed. The the tissue at the site of the nail bed has prevented drainage from the under the proximal nail fold  Dx  S/P nail surgery with probable infection. Right hallux.  ROV  Debridement  of the necrotic tissue at the proximal nail fold.  The proximal nail fold was separated from the nailbed to allow drainage to occur. Patient was told to continue soaks and bandaging. Return to the clinic in one week. This patient did not bring his interpreter since he says he can converse with me. This is the second time   I found explaining my treatment plan to him very difficult and I am not sure he completely understands  my directions.  Told him to continue antibiotics until completed. Return to the clinic in 10 days for further evaluation and treatment.  I believe he keeps asking me to perform the same procedure on other toes even though this surgery needs to completely heal.    Gardiner Barefoot DPM

## 2016-06-09 ENCOUNTER — Other Ambulatory Visit: Payer: Medicaid Other

## 2016-06-11 ENCOUNTER — Encounter: Payer: Self-pay | Admitting: Podiatry

## 2016-06-11 ENCOUNTER — Ambulatory Visit (INDEPENDENT_AMBULATORY_CARE_PROVIDER_SITE_OTHER): Payer: Medicaid Other | Admitting: Podiatry

## 2016-06-11 DIAGNOSIS — Z09 Encounter for follow-up examination after completed treatment for conditions other than malignant neoplasm: Secondary | ICD-10-CM

## 2016-06-11 NOTE — Progress Notes (Signed)
   Subjective:    Patient ID: Keith Flores, male    DOB: 07-04-1960, 56 y.o.   MRN: [id number]  Toe Pain     this patient presents to the office stating that his big toe nail is healing and there is no pain or discomfort or drainage. Patient has taken antibiotics since his last visit and has been soaking and bandaging his toe. He says he is having no pain walking at this time. He presents the office today for continued evaluation and treatment of his big toe, right foot    Review of Systems  All other systems reviewed and are negative.      Objective:   Physical Exam GENERAL APPEARANCE: Alert, conversant. Appropriately groomed. No acute distress.  VASCULAR: Pedal pulses are  palpable at  [facility] and PT bilateral.  Capillary refill time is immediate to all digits,  Normal temperature gradient.  Digital hair growth is present bilateral  NEUROLOGIC: sensation is normal to 5.07 monofilament at 5/5 sites bilateral.  Light touch is intact bilateral, Muscle strength normal.  MUSCULOSKELETAL: acceptable muscle strength, tone and stability bilateral.  Intrinsic muscluature intact bilateral.  Rectus appearance of foot and digits noted bilateral.   DERMATOLOGIC: skin color, texture, and turgor are within normal limits.  No preulcerative lesions or ulcers  are seen, no interdigital maceration noted.  No open lesions present.   No drainage noted.  NAILS  There is no evidence of any redness, swelling or drainage from the right great toenail area right foot. There is significant desquamation around the skin at the site of the surgery. No drainage from the proximal nail fold is noted. The nail bed has no evidence of any drainage   S/P nail surgery  Evaluation of the surgical nail site reveals no evidence of any infection or drainage. This area has healed with evidence of healing and desquamation at this visit I used the dremel  tool to de bride the desquamation around the nailbed itself.  RTC  prn   Gardiner Barefoot DPM            Assessment & Plan:  Onychomycosis/  Traumatized nail plate right hallux   ROV  Nail surgery.  Treatment options and alternatives discussed.  Recommended permanent phenol matrixectomy and patient agreed.  Right hallux  was prepped with alcohol and a toe block of 3cc of 2% lidocaine plain was administered in a digital toe block. .  The toe was then prepped with betadine solution .  The offending nail border was then excised and matrix tissue exposed.  Phenol was then applied to the matrix tissue followed by an alcohol wash.  Antibiotic ointment and a dry sterile dressing was applied.  The patient was dispensed instructions for aftercare.  RTC 1 week.     Gardiner Barefoot DPM    Gardiner Barefoot DPM

## 2016-07-07 ENCOUNTER — Telehealth: Payer: Self-pay | Admitting: *Deleted

## 2016-07-07 ENCOUNTER — Other Ambulatory Visit: Payer: Self-pay | Admitting: Cardiovascular Disease

## 2016-07-07 ENCOUNTER — Ambulatory Visit (HOSPITAL_BASED_OUTPATIENT_CLINIC_OR_DEPARTMENT_OTHER): Payer: Medicaid Other

## 2016-07-07 ENCOUNTER — Other Ambulatory Visit: Payer: Self-pay | Admitting: Podiatry

## 2016-07-07 ENCOUNTER — Other Ambulatory Visit (HOSPITAL_BASED_OUTPATIENT_CLINIC_OR_DEPARTMENT_OTHER): Payer: Medicaid Other

## 2016-07-07 VITALS — BP 129/70 | HR 83 | Temp 98.7°F | Resp 18

## 2016-07-07 DIAGNOSIS — D751 Secondary polycythemia: Secondary | ICD-10-CM

## 2016-07-07 LAB — CBC WITH DIFFERENTIAL/PLATELET
BASO%: 0.9 % (ref 0.0–2.0)
BASOS ABS: 0.1 10*3/uL (ref 0.0–0.1)
EOS%: 8.1 % — AB (ref 0.0–7.0)
Eosinophils Absolute: 0.5 10*3/uL (ref 0.0–0.5)
HCT: 46 % (ref 38.4–49.9)
HGB: 14.2 g/dL (ref 13.0–17.1)
LYMPH%: 36.1 % (ref 14.0–49.0)
MCH: 22.8 pg — AB (ref 27.2–33.4)
MCHC: 30.9 g/dL — AB (ref 32.0–36.0)
MCV: 73.8 fL — AB (ref 79.3–98.0)
MONO#: 0.4 10*3/uL (ref 0.1–0.9)
MONO%: 7.3 % (ref 0.0–14.0)
NEUT#: 2.8 10*3/uL (ref 1.5–6.5)
NEUT%: 47.6 % (ref 39.0–75.0)
Platelets: 209 10*3/uL (ref 140–400)
RBC: 6.23 10*6/uL — ABNORMAL HIGH (ref 4.20–5.82)
RDW: 16.2 % — ABNORMAL HIGH (ref 11.0–14.6)
WBC: 5.8 10*3/uL (ref 4.0–10.3)
lymph#: 2.1 10*3/uL (ref 0.9–3.3)
nRBC: 0 % (ref 0–0)

## 2016-07-07 NOTE — Patient Instructions (Signed)
Therapeutic Phlebotomy Therapeutic phlebotomy is the controlled removal of blood from a person's body for the purpose of treating a medical condition. The procedure is similar to donating blood. Usually, about a pint (470 mL, or 0.47L) of blood is removed. The average adult has 9-12 pints (4.3-5.7 L) of blood. Therapeutic phlebotomy may be used to treat the following medical conditions:  Hemochromatosis. This is a condition in which the blood contains too much iron.  Polycythemia vera. This is a condition in which the blood contains too many red blood cells.  Porphyria cutanea tarda. This is a disease in which an important part of hemoglobin is not made properly. It results in the buildup of abnormal amounts of porphyrins in the body.  Sickle cell disease. This is a condition in which the red blood cells form an abnormal crescent shape rather than a round shape. Tell a health care provider about:  Any allergies you have.  All medicines you are taking, including vitamins, herbs, eye drops, creams, and over-the-counter medicines.  Any problems you or family members have had with anesthetic medicines.  Any blood disorders you have.  Any surgeries you have had.  Any medical conditions you have. What are the risks? Generally, this is a safe procedure. However, problems may occur, including:  Nausea or light-headedness.  Low blood pressure.  Soreness, bleeding, swelling, or bruising at the needle insertion site.  Infection. What happens before the procedure?  Follow instructions from your health care provider about eating or drinking restrictions.  Ask your health care provider about changing or stopping your regular medicines. This is especially important if you are taking diabetes medicines or blood thinners.  Wear clothing with sleeves that can be raised above the elbow.  Plan to have someone take you home after the procedure.  You may have a blood sample taken. What happens  during the procedure?  A needle will be inserted into one of your veins.  Tubing and a collection bag will be attached to that needle.  Blood will flow through the needle and tubing into the collection bag.  You may be asked to open and close your hand slowly and continually during the entire collection.  After the specified amount of blood has been removed from your body, the collection bag and tubing will be clamped.  The needle will be removed from your vein.  Pressure will be held on the site of the needle insertion to stop the bleeding.  A bandage (dressing) will be placed over the needle insertion site. The procedure may vary among health care providers and hospitals. What happens after the procedure?  Your recovery will be assessed and monitored.  You can return to your normal activities as directed by your health care provider. This information is not intended to replace advice given to you by your health care provider. Make sure you discuss any questions you have with your health care provider. Document Released: 06/10/2010 Document Revised: 09/08/2015 Document Reviewed: 01/02/2014 Elsevier Interactive Patient Education  2017 Elsevier Inc.  

## 2016-07-07 NOTE — Progress Notes (Signed)
Pt here for phlebotomy HCT 46. Pt requested more than 500 be taken. 603cc taken per pt request. Pt given nurishment and fluids, observed x30 mins afterwards.

## 2016-07-07 NOTE — Telephone Encounter (Signed)
Request refill orders for Cephalexin from Dr. Prudence Davidson.

## 2016-08-04 ENCOUNTER — Ambulatory Visit: Payer: Medicaid Other

## 2016-08-04 ENCOUNTER — Other Ambulatory Visit (HOSPITAL_BASED_OUTPATIENT_CLINIC_OR_DEPARTMENT_OTHER): Payer: Medicaid Other

## 2016-08-04 DIAGNOSIS — D751 Secondary polycythemia: Secondary | ICD-10-CM | POA: Diagnosis not present

## 2016-08-04 LAB — CBC WITH DIFFERENTIAL/PLATELET
BASO%: 0.6 % (ref 0.0–2.0)
Basophils Absolute: 0 10*3/uL (ref 0.0–0.1)
EOS%: 7.2 % — ABNORMAL HIGH (ref 0.0–7.0)
Eosinophils Absolute: 0.5 10*3/uL (ref 0.0–0.5)
HCT: 43.7 % (ref 38.4–49.9)
HGB: 13.5 g/dL (ref 13.0–17.1)
LYMPH%: 35.3 % (ref 14.0–49.0)
MCH: 22.5 pg — ABNORMAL LOW (ref 27.2–33.4)
MCHC: 30.9 g/dL — ABNORMAL LOW (ref 32.0–36.0)
MCV: 72.7 fL — ABNORMAL LOW (ref 79.3–98.0)
MONO#: 0.5 10*3/uL (ref 0.1–0.9)
MONO%: 7.8 % (ref 0.0–14.0)
NEUT#: 3.3 10*3/uL (ref 1.5–6.5)
NEUT%: 49.1 % (ref 39.0–75.0)
Platelets: 219 10*3/uL (ref 140–400)
RBC: 6.01 10*6/uL — AB (ref 4.20–5.82)
RDW: 17.1 % — ABNORMAL HIGH (ref 11.0–14.6)
WBC: 6.8 10*3/uL (ref 4.0–10.3)
lymph#: 2.4 10*3/uL (ref 0.9–3.3)

## 2016-08-04 NOTE — Progress Notes (Signed)
Per Dr. Ernestina Penna office note, keep HCT < 45 %. Today's HCT was 43.8. No phlebotomy needed. Patient verbalized understanding.

## 2016-09-01 ENCOUNTER — Other Ambulatory Visit (HOSPITAL_BASED_OUTPATIENT_CLINIC_OR_DEPARTMENT_OTHER): Payer: Medicaid Other

## 2016-09-01 ENCOUNTER — Other Ambulatory Visit: Payer: Self-pay | Admitting: Hematology

## 2016-09-01 DIAGNOSIS — D751 Secondary polycythemia: Secondary | ICD-10-CM | POA: Diagnosis present

## 2016-09-01 LAB — CBC WITH DIFFERENTIAL/PLATELET
BASO%: 1.3 % (ref 0.0–2.0)
BASOS ABS: 0.1 10*3/uL (ref 0.0–0.1)
EOS ABS: 0.6 10*3/uL — AB (ref 0.0–0.5)
EOS%: 9.6 % — ABNORMAL HIGH (ref 0.0–7.0)
HEMATOCRIT: 45.9 % (ref 38.4–49.9)
HGB: 14.6 g/dL (ref 13.0–17.1)
LYMPH#: 2.1 10*3/uL (ref 0.9–3.3)
LYMPH%: 31.7 % (ref 14.0–49.0)
MCH: 22.5 pg — AB (ref 27.2–33.4)
MCHC: 31.8 g/dL — ABNORMAL LOW (ref 32.0–36.0)
MCV: 70.8 fL — ABNORMAL LOW (ref 79.3–98.0)
MONO#: 0.5 10*3/uL (ref 0.1–0.9)
MONO%: 7.4 % (ref 0.0–14.0)
NEUT#: 3.3 10*3/uL (ref 1.5–6.5)
NEUT%: 50 % (ref 39.0–75.0)
Platelets: 247 10*3/uL (ref 140–400)
RBC: 6.49 10*6/uL — ABNORMAL HIGH (ref 4.20–5.82)
RDW: 18.9 % — ABNORMAL HIGH (ref 11.0–14.6)
WBC: 6.5 10*3/uL (ref 4.0–10.3)

## 2016-09-09 ENCOUNTER — Telehealth: Payer: Self-pay

## 2016-09-09 NOTE — Telephone Encounter (Signed)
I have done a Sildenafil PA over the phone with Margreta Journey at [facility] at [phone]. Per Margreta Journey we can call back in 24 hours to check status.  Also, per Margreta Journey, approval of this PA is unlikely as the pt does not have a DX of pulmonary arterial hypertension.

## 2016-09-10 ENCOUNTER — Telehealth: Payer: Self-pay

## 2016-09-10 NOTE — Telephone Encounter (Signed)
Sildenafil has been denied per [employer] [employer]. Local pharmacy notified. Patient may pay out of pocket, if desired.

## 2016-09-24 ENCOUNTER — Other Ambulatory Visit: Payer: Self-pay | Admitting: Podiatry

## 2016-09-25 ENCOUNTER — Telehealth: Payer: Self-pay | Admitting: *Deleted

## 2016-09-25 NOTE — Telephone Encounter (Signed)
Escribed received requesting refill of the Keflex. I told pt if he was still having a problem with an infection Dr. Prudence Davidson would want him to be seen. I will have schedulers call.

## 2016-09-25 NOTE — Progress Notes (Signed)
[facility] OFFICE PROGRESS NOTE  Elwyn Reach, MD [address] [name] [address]  DIAGNOSIS: Polycythemia, likely secondary to smoking and OSA     INTERVAL HISTORY: Keith Flores 55 y.o. male  who is here for further follow up for erythrocytosis.  He was last seen by Dr. Juliann Mule 6 month ago, and has not been followed due to his travel. Of note, he has an extensive cardiac history including CAD s/p CABG in 2010, Hypertension and hyperlipidemia for which he is being managed by cardiology. He had a lexiscan myoview recently due to test discomfort which was revealed the following: Low risk stress nuclear study with a moderate size, medium intensity, partially reversible inferior defect consistent with inferior thinning vs prior infarct and mild inferior ischemia. LV Ejection Fraction: 52%. LV Wall Motion: NL LV Function; NL Wall Motion. He was last seen by Dr. Sherren Mocha 12/2012.   He denies being told he had an elevated hemoglobin on prior CBCs. Looking through his lab values, he has had significant fluctuations with his hct being 53.6 to as low as 39 on 12/30/2010 to as high as 57.7 on 01/10/2013. He also denied feeling very itchy after taking a shower or bathing. He denied burning pain in his feet or cyanosis or discolorations. He also denied chest and abdominal pain, myalgia and weakness, fatigue, headache, blurred vision, transient loss of vision, paresthesias, slow mentation. He denied a history of blood clots or gout. He smoked about 6 cigarettes daily and smoked for many years prior. He denied any dysuria or hematuria or weight changes. He denied living in high altitudes or the use of testosterone.    CURRENT THERAPY: Phlebotomy to maintain hct less than 45%.   INTERIM HISTORY  Keith Flores returns for follow-up, he is accompanied by an interpreter. He reports not needing phlebotomy last time. He feels fine today. He has not traveled lately. He has no troubles  with Phlebotomy. He has PCP and he has not seen them since last year so he does not know his recently liver function. He is taking diabetes medication. He will be going on extended vacation in October to February.     MEDICAL HISTORY: Past Medical History:  Diagnosis Date  . Asthma   . Chest pain    01/2011 in setting of HTN urgency: Chest CT demonstrated no pulmonary embolus, postoperative appearance following CABG and small left pleural effusion and dependent atelectasis in the left lung  . Coronary artery disease    CABG 12/12 (LIMA-LAD, SVG-DX, SVG-OM, SVG-PDA) c/b post op AFib Rx with amio Rx. s/p successful PCI of the right coronary artery utilizing overlapping drug-eluting stents in the mid and distal vessel (done after abnormal nuc)  . Diabetes mellitus   . Elevated hemoglobin ([place])   . Hyperlipidemia   . Hypertension    ALLERGIES:  has No Known Allergies.  MEDICATIONS: has a current medication list which includes the following prescription(s): amlodipine, aspirin, carvedilol, clopidogrel, enalapril, glimepiride, metformin, multivitamin-iron-minerals-folic acid, nitroglycerin, pravastatin, and sildenafil, and the following Facility-Administered Medications: sodium chloride.  SURGICAL HISTORY:  Past Surgical History:  Procedure Laterality Date  . CARDIAC CATHETERIZATION    . CORONARY ARTERY BYPASS GRAFT  12/30/2010   Procedure: CORONARY ARTERY BYPASS GRAFTING (CABG);  Surgeon: Grace Isaac, MD;  Location: [place];  Service: Open Heart Surgery;  Laterality: N/A;  . LEFT HEART CATHETERIZATION WITH CORONARY ANGIOGRAM N/A 01/12/2013   Procedure: LEFT HEART CATHETERIZATION WITH CORONARY ANGIOGRAM;  Surgeon: Legrand Como  Ree Kida, MD;  Location: [facility] CATH LAB;  Service: Cardiovascular;  Laterality: N/A;  . LEFT HEART CATHETERIZATION WITH CORONARY/GRAFT ANGIOGRAM  03/07/2011   Procedure: LEFT HEART CATHETERIZATION WITH Beatrix Fetters;  Surgeon: Sherren Mocha, MD;  Location: [facility] CATH  LAB;  Service: Cardiovascular;;  . PERCUTANEOUS CORONARY STENT INTERVENTION (PCI-S)  03/07/2011   Procedure: PERCUTANEOUS CORONARY STENT INTERVENTION (PCI-S);  Surgeon: Sherren Mocha, MD;  Location: [facility] CATH LAB;  Service: Cardiovascular;;    REVIEW OF SYSTEMS:   Constitutional: Denies fevers, chills or abnormal weight loss Eyes: Denies blurriness of vision Ears, nose, mouth, throat, and face: Denies mucositis or sore throat Respiratory: Denies cough, dyspnea or wheezes Cardiovascular: Denies palpitation, chest discomfort or lower extremity swelling Gastrointestinal:  Denies nausea, heartburn or change in bowel habits Skin: Denies abnormal skin rashes Lymphatics: Denies new lymphadenopathy or easy bruising Neurological:Denies numbness, tingling or new weaknesses Behavioral/Psych: Mood is stable, no new changes  All other systems were reviewed with the patient and are negative.  PHYSICAL EXAMINATION: ECOG PERFORMANCE STATUS: 0 - Asymptomatic  Blood pressure 135/75, pulse 80, temperature 98.2 F (36.8 C), temperature source Oral, resp. rate 18, height _0  (1.676 m), weight 207 lb 14.4 oz (94.3 kg), SpO2 97 %.   GENERAL:alert, no distress and comfortable; moderately obese SKIN: skin color, texture, turgor are normal, no rashes or significant lesions EYES: normal, Conjunctiva are pink and non-injected, sclera clear OROPHARYNX:no exudate, no erythema and lips, buccal mucosa, and tongue normal  NECK: supple, thyroid normal size, non-tender, without nodularity LYMPH:  no palpable lymphadenopathy in the cervical, axillary or supraclavicular LUNGS: clear to auscultation and percussion with normal breathing effort HEART: regular rate & rhythm and no murmurs and no lower extremity edema ABDOMEN:abdomen soft, non-tender and normal bowel sounds Musculoskeletal:no cyanosis of digits and no clubbing  NEURO: alert & oriented x 3 with fluent speech, no focal motor/sensory deficits  LABORATORY  DATA: CBC Latest Ref Rng & Units 09/29/2016 09/01/2016 08/04/2016  WBC 4.0 - 10.3 10e3/uL 5.9 6.5 6.8  Hemoglobin 13.0 - 17.1 g/dL 14.8 14.6 13.5  Hematocrit 38.4 - 49.9 % 47.0 45.9 43.7  Platelets 140 - 400 10e3/uL 197 247 219    CMP Latest Ref Rng & Units 07/23/2015 04/05/2015 03/21/2014  Glucose 70 - 140 mg/dl 269(H) 140(H) 133  BUN 7.0 - 26.0 mg/dL 13.4 13 18.5  Creatinine 0.7 - 1.3 mg/dL 1.0 0.90 1.0  Sodium 136 - 145 mEq/L 137 138 140  Potassium 3.5 - 5.1 mEq/L 3.6 3.9 4.0  Chloride 98 - 110 mmol/L - 104 -  CO2 22 - 29 mEq/L _1 Calcium 8.4 - 10.4 mg/dL 9.5 9.5 9.7  Total Protein 6.4 - 8.3 g/dL 7.5 7.1 -  Total Bilirubin 0.20 - 1.20 mg/dL 0.77 0.7 -  Alkaline Phos 40 - 150 U/L 81 73 -  AST 5 - 34 U/L 21 19 -  ALT 0 - 55 U/L 35 28 -   Erythropoietin  Status: Finalresult Visible to patient:  Not Released Nextappt: 02/01/2014 at 09:00 AM in [facility] Room) Dx:  Polycythemia, secondary         Ref Range 51yrago    Erythropoietin 2.6 - 18.5 mIU/mL 13.7      PATHOLOGY REPORT 02/01/2014 Diagnosis Bone Marrow, Aspirate,Biopsy, and Clot, left iliac crest - SLIGHTLY HYPERCELLULAR BONE MARROW FOR AGE WITH TRILINEAGE HEMATOPOIESIS. - SEE COMMENT. PERIPHERAL BLOOD: - ERYTHROCYTOSIS. Diagnosis Note The overall findings are not considered specific or diagnostic of a myeloproliferative  neoplasm. Correlation with cytogenetic and JAK-2 mutation analysis is recommended.  Cytogenetics: normal   RADIOGRAPHIC STUDIES: 05/20/2013 CT ABDOMEN WITH CONTRAST TECHNIQUE:  Multidetector CT imaging of the abdomen was performed using the standard protocol following bolus administration of intravenous contrast. CONTRAST: 153m OMNIPAQUE IOHEXOL 300 MG/ML SOLN  COMPARISON: 04/12/2006 FINDINGS: The lung bases appear clear. No pleural or pericardial effusion.  No focal liver abnormality identified. The gallbladder is normal. There is no biliary dilatation. The spleen  is unremarkable measuring 10.2 cm in length. The adrenal glands are both normal. The right kidney is normal. Cyst within the upper pole of the left kidney is identified measuring 1.2 cm. Normal caliber of the abdominal aorta. No aneurysm. There is no upper abdominal adenopathy identified. The stomach is normal. The small bowel loops are unremarkable. Normal appearance of the colon. Review of the visualized osseous structures is significant for mild spondylosis. IMPRESSION: 1. No acute findings identified within the upper abdomen. 2. No mass or adenopathy and no evidence for splenomegaly.    ASSESSMENT:  Keith Flores a 56y.o. male with a history of CAD/CABG presents with persistence of elevated (H/H) without thrombocytosis, leukocytosis. He also has an elevated vitamin b12. CT of Abdomen (2008) was negative for splenomegaly. JAK2 was negative. Normal EPO level. He denies classic aquagenic pruritus, negative for erythromegalalia. Secondary causes due to RKingman HLynnand steroids/testerone are felt to be less likely, but he dose have extensive smoking history.  1.  Polycythemia, secondary to smoking  --He only needs one major WHO criteria for P vera, and does not meet any of the minor criteria. I reviewed his bone marrow biopsy with him, which does not support myeloproliferative disorder. JAK2 mutation was negative, his erythropoietin level was normal.  -I think his polycythemia is probable secondary to his extensive smoking history., Although he has never been hypoxic. -He had sleep study in July 2016. He was told by his primary care physicians that he has sleep apnea but does not need CPAP.  -Given his prior extensive history of coronary artery disease, I recommend continue phlebotomy to keep his hematocrit less than 45%. I do not recommend Hydrea.  -He will continue daily baby aspirin (81 mg) daily.  -Labs reviewed an his HCT is 429today.  -I suggest checking his BG and Liver/kidney function  with His PCP twice yearly.  -He smokes 3-4 cigarettes a day. I discussed that when he quits he will need less phlebotomies.  -phlebotomy today.   -He is going on vacation early next month until February  -f/u in 04/2017 when he returns     2. CAD -continue follow up with his cardiologist Dr. CDellia Cloud 3. HTN, DM, hyperlipidemia -follow up with PCP   PLAN: -phlebotomy today -he is leaving next month for vacation  -Lab and phlebotomy monthly X3, starting the end of Feb 2019 when he returns  -F/u in 04/2017 on same day of his last scheduled lab appointment    All questions were answered. The patient knows to call the clinic with any problems, questions or concerns. We can certainly see the patient much sooner if necessary.  I spent 15 minutes counseling the patient face to face in the presence of an arabic intepreter. The total time spent in the appointment was 20 minutes.  This document serves as a record of services personally performed by YTruitt Merle MD. It was created on her behalf by AJoslyn Devon a trained medical scribe. The creation of this record  is based on the scribe's personal observations and the provider's statements to them. This document has been checked and approved by the attending provider.   I have reviewed the above documentation for accuracy and completeness and I agree with the above.  Truitt Merle, MD 09/29/2016

## 2016-09-26 ENCOUNTER — Ambulatory Visit: Payer: Self-pay | Admitting: Podiatry

## 2016-09-29 ENCOUNTER — Ambulatory Visit (HOSPITAL_BASED_OUTPATIENT_CLINIC_OR_DEPARTMENT_OTHER): Payer: Medicaid Other

## 2016-09-29 ENCOUNTER — Other Ambulatory Visit (HOSPITAL_BASED_OUTPATIENT_CLINIC_OR_DEPARTMENT_OTHER): Payer: Medicaid Other

## 2016-09-29 ENCOUNTER — Ambulatory Visit (HOSPITAL_BASED_OUTPATIENT_CLINIC_OR_DEPARTMENT_OTHER): Payer: Medicaid Other | Admitting: Hematology

## 2016-09-29 VITALS — BP 135/75 | HR 80 | Temp 98.2°F | Resp 18 | Ht 66.0 in | Wt 207.9 lb

## 2016-09-29 VITALS — BP 139/80 | HR 78 | Resp 18

## 2016-09-29 DIAGNOSIS — D751 Secondary polycythemia: Secondary | ICD-10-CM

## 2016-09-29 DIAGNOSIS — E119 Type 2 diabetes mellitus without complications: Secondary | ICD-10-CM | POA: Diagnosis not present

## 2016-09-29 DIAGNOSIS — Z720 Tobacco use: Secondary | ICD-10-CM

## 2016-09-29 DIAGNOSIS — I10 Essential (primary) hypertension: Secondary | ICD-10-CM | POA: Diagnosis not present

## 2016-09-29 LAB — CBC WITH DIFFERENTIAL/PLATELET
BASO%: 0.7 % (ref 0.0–2.0)
BASOS ABS: 0 10*3/uL (ref 0.0–0.1)
EOS%: 6.3 % (ref 0.0–7.0)
Eosinophils Absolute: 0.4 10*3/uL (ref 0.0–0.5)
HEMATOCRIT: 47 % (ref 38.4–49.9)
HEMOGLOBIN: 14.8 g/dL (ref 13.0–17.1)
LYMPH#: 2.2 10*3/uL (ref 0.9–3.3)
LYMPH%: 36.7 % (ref 14.0–49.0)
MCH: 23.1 pg — AB (ref 27.2–33.4)
MCHC: 31.5 g/dL — ABNORMAL LOW (ref 32.0–36.0)
MCV: 73.2 fL — ABNORMAL LOW (ref 79.3–98.0)
MONO#: 0.4 10*3/uL (ref 0.1–0.9)
MONO%: 7 % (ref 0.0–14.0)
NEUT#: 2.9 10*3/uL (ref 1.5–6.5)
NEUT%: 49.3 % (ref 39.0–75.0)
Platelets: 197 10*3/uL (ref 140–400)
RBC: 6.42 10*6/uL — ABNORMAL HIGH (ref 4.20–5.82)
RDW: 18 % — ABNORMAL HIGH (ref 11.0–14.6)
WBC: 5.9 10*3/uL (ref 4.0–10.3)

## 2016-09-29 NOTE — Patient Instructions (Signed)

## 2016-09-29 NOTE — Progress Notes (Signed)
Therapeutic phlebotomy performed per MD order, in left AC using 16 gauge phlebotomy set starting at 1502 and ending at 1507 removing 512 grams. Pt tolerated procedure well. Pt given snack and drink.  1530: Pt refused to stay full 30 minutes post observation. Pt and VS stable at discharge.

## 2016-09-30 ENCOUNTER — Telehealth: Payer: Self-pay | Admitting: Hematology

## 2016-09-30 ENCOUNTER — Encounter: Payer: Self-pay | Admitting: Hematology

## 2016-09-30 NOTE — Telephone Encounter (Signed)
Called patient regarding febuary - April 2019 appointments

## 2017-03-16 ENCOUNTER — Other Ambulatory Visit: Payer: Self-pay | Admitting: Hematology

## 2017-03-16 ENCOUNTER — Inpatient Hospital Stay: Payer: Medicaid Other

## 2017-03-16 ENCOUNTER — Inpatient Hospital Stay: Payer: Medicaid Other | Attending: Hematology

## 2017-03-16 VITALS — BP 142/84 | HR 87 | Temp 97.8°F | Resp 16

## 2017-03-16 DIAGNOSIS — D751 Secondary polycythemia: Secondary | ICD-10-CM

## 2017-03-16 LAB — CBC WITH DIFFERENTIAL (CANCER CENTER ONLY)
Basophils Absolute: 0.1 10*3/uL (ref 0.0–0.1)
Basophils Relative: 1 %
EOS ABS: 0.4 10*3/uL (ref 0.0–0.5)
EOS PCT: 6 %
HCT: 45.3 % (ref 38.4–49.9)
Hemoglobin: 14.3 g/dL (ref 13.0–17.1)
LYMPHS ABS: 2.2 10*3/uL (ref 0.9–3.3)
LYMPHS PCT: 27 %
MCH: 24.2 pg — ABNORMAL LOW (ref 27.2–33.4)
MCHC: 31.7 g/dL — AB (ref 32.0–36.0)
MCV: 76.4 fL — AB (ref 79.3–98.0)
MONO ABS: 0.6 10*3/uL (ref 0.1–0.9)
Monocytes Relative: 7 %
Neutro Abs: 4.9 10*3/uL (ref 1.5–6.5)
Neutrophils Relative %: 59 %
PLATELETS: 279 10*3/uL (ref 140–400)
RBC: 5.93 MIL/uL — ABNORMAL HIGH (ref 4.20–5.82)
RDW: 16.2 % — AB (ref 11.0–14.6)
WBC Count: 8.1 10*3/uL (ref 4.0–10.3)

## 2017-03-16 NOTE — Patient Instructions (Signed)
Therapeutic Phlebotomy Discharge Instructions  - Increase your fluid intake over the next 4 hours  - No smoking for 30 minutes  - Avoid using the affected arm (the one you had the blood drawn from) for heavy lifting or other activities.  - You may resume all normal activities after 30 minutes.  You are to notify the office if you experience:   - Persistent dizziness and/or lightheadedness -Uncontrolled or excessive bleeding at the site.   Therapeutic Phlebotomy, Care After Refer to this sheet in the next few weeks. These instructions provide you with information about caring for yourself after your procedure. Your health care provider may also give you more specific instructions. Your treatment has been planned according to current medical practices, but problems sometimes occur. Call your health care provider if you have any problems or questions after your procedure. What can I expect after the procedure? After the procedure, it is common to have:  Light-headedness or dizziness. You may feel faint.  Nausea.  Tiredness.  Follow these instructions at home: Activity  Return to your normal activities as directed by your health care provider. Most people can go back to their normal activities right away.  Avoid strenuous physical activity and heavy lifting or pulling for about 5 hours after the procedure. Do not lift anything that is heavier than 10 lb (4.5 kg).  Athletes should avoid strenuous exercise for at least 12 hours.  Change positions slowly for the remainder of the day. This will help to prevent light-headedness or fainting.  If you feel light-headed, lie down until the feeling goes away. Eating and drinking  Be sure to eat well-balanced meals for the next 24 hours.  Drink enough fluid to keep your urine clear or pale yellow.  Avoid drinking alcohol on the day that you had the procedure. Care of the Needle Insertion Site  Keep your bandage dry. You can remove the  bandage after about 5 hours or as directed by your health care provider.  If you have bleeding from the needle insertion site, elevate your arm and press firmly on the site until the bleeding stops.  If you have bruising at the site, apply ice to the area: ? Put ice in a plastic bag. ? Place a towel between your skin and the bag. ? Leave the ice on for 20 minutes, 2-3 times a day for the first 24 hours.  If the swelling does not go away after 24 hours, apply a warm, moist washcloth to the area for 20 minutes, 2-3 times a day. General instructions  Avoid smoking for at least 30 minutes after the procedure.  Keep all follow-up visits as directed by your health care provider. It is important to continue with further therapeutic phlebotomy treatments as directed. Contact a health care provider if:  You have redness, swelling, or pain at the needle insertion site.  You have fluid, blood, or pus coming from the needle insertion site.  You feel light-headed, dizzy, or nauseated, and the feeling does not go away.  You notice new bruising at the needle insertion site.  You feel weaker than normal.  You have a fever or chills. Get help right away if:  You have severe nausea or vomiting.  You have chest pain.  You have trouble breathing. This information is not intended to replace advice given to you by your health care provider. Make sure you discuss any questions you have with your health care provider. Document Released: 06/10/2010 Document Revised: 09/08/2015   Document Reviewed: 01/02/2014 Elsevier Interactive Patient Education  2018 [employer]   

## 2017-03-16 NOTE — Progress Notes (Signed)
Patient reported "heavy nose bleeds" while in [place] 2 weeks ago.  Patient denies bruising.  Has had no other incidents with bleeding since his return.  Sandi Mealy, PA-C was notified of change and assessed patient in infusion room.  Phlebotomy procedure completed, patient tolerated well.

## 2017-04-13 ENCOUNTER — Inpatient Hospital Stay: Payer: Medicaid Other | Attending: Hematology

## 2017-04-13 ENCOUNTER — Inpatient Hospital Stay: Payer: Medicaid Other

## 2017-04-13 VITALS — BP 163/80 | HR 107 | Temp 98.4°F | Resp 18

## 2017-04-13 DIAGNOSIS — D751 Secondary polycythemia: Secondary | ICD-10-CM | POA: Insufficient documentation

## 2017-04-13 LAB — CBC WITH DIFFERENTIAL (CANCER CENTER ONLY)
BASOS ABS: 0.1 10*3/uL (ref 0.0–0.1)
BASOS PCT: 1 %
EOS ABS: 0.2 10*3/uL (ref 0.0–0.5)
Eosinophils Relative: 3 %
HCT: 47.3 % (ref 38.4–49.9)
HEMOGLOBIN: 14.8 g/dL (ref 13.0–17.1)
Lymphocytes Relative: 27 %
Lymphs Abs: 1.7 10*3/uL (ref 0.9–3.3)
MCH: 23.1 pg — ABNORMAL LOW (ref 27.2–33.4)
MCHC: 31.2 g/dL — AB (ref 32.0–36.0)
MCV: 74.1 fL — ABNORMAL LOW (ref 79.3–98.0)
MONOS PCT: 6 %
Monocytes Absolute: 0.4 10*3/uL (ref 0.1–0.9)
NEUTROS ABS: 4 10*3/uL (ref 1.5–6.5)
NEUTROS PCT: 63 %
Platelet Count: 275 10*3/uL (ref 140–400)
RBC: 6.38 MIL/uL — ABNORMAL HIGH (ref 4.20–5.82)
RDW: 16.2 % — AB (ref 11.0–14.6)
WBC Count: 6.4 10*3/uL (ref 4.0–10.3)

## 2017-04-13 NOTE — Progress Notes (Signed)
Keith Flores presents today for phlebotomy per MD orders. Phlebotomy procedure started at 1445 and ended at 1500 by Karen Chafe, RN. 500 g removed. Patient tolerated procedure well. IV needle removed intact. Patient declines 30 minute post procedure observation. VSS at d/c.

## 2017-04-13 NOTE — Patient Instructions (Signed)

## 2017-04-17 ENCOUNTER — Encounter: Payer: Self-pay | Admitting: Cardiovascular Disease

## 2017-04-17 ENCOUNTER — Ambulatory Visit: Payer: Medicaid Other | Admitting: Cardiovascular Disease

## 2017-04-17 VITALS — BP 132/80 | HR 106 | Ht 66.0 in | Wt 200.6 lb

## 2017-04-17 DIAGNOSIS — I2510 Atherosclerotic heart disease of native coronary artery without angina pectoris: Secondary | ICD-10-CM

## 2017-04-17 DIAGNOSIS — I10 Essential (primary) hypertension: Secondary | ICD-10-CM | POA: Diagnosis not present

## 2017-04-17 MED ORDER — ENALAPRIL MALEATE 10 MG PO TABS: 10.0000 mg | ORAL_TABLET | Freq: Two times a day (BID) | ORAL | 3 refills | Status: DC

## 2017-04-17 MED ORDER — PRAVASTATIN SODIUM 40 MG PO TABS: 40.0000 mg | ORAL_TABLET | Freq: Every evening | ORAL | 3 refills | Status: DC

## 2017-04-17 MED ORDER — CLOPIDOGREL BISULFATE 75 MG PO TABS
75.0000 mg | ORAL_TABLET | Freq: Every day | ORAL | 3 refills | Status: AC
Start: 2017-04-17 — End: ?

## 2017-04-17 MED ORDER — CARVEDILOL 25 MG PO TABS: 25.0000 mg | ORAL_TABLET | Freq: Two times a day (BID) | ORAL | 3 refills | Status: AC

## 2017-04-17 NOTE — Progress Notes (Signed)
Cardiology Office Note Date:  04/18/2017   ID:  Keith Flores, DOB 1961-01-09, MRN [id number]  PCP:  Elwyn Reach, MD  Cardiologist:  Sherren Mocha, MD    Chief Complaint  Patient presents with  . Follow-up    CAD     History of Present Illness: Keith Flores is a 57 y.o. male who presents for follow-up of coronary artery disease.  He has coronary artery disease status post CABG in 2010. The patient had early graft closure involving the vein graft to right coronary artery and he underwent extensive stenting of the native RCA. He's followed for hypertension, Type II DM, and hyperlipidemia as well.  The patient is here with a translator today.  He has recently returned from an extended trip to [place].  He has run out of some of his medicines and was started on some new medicine when he was overseas.  And is a bit difficult to determine exactly what he is taking because he did not bring all of his medicine in today.  The best I can tell he discontinued or ran out of carvedilol.  He is taking both enalapril and irbesartan/hydrochlorothiazide.  He continues on amlodipine, aspirin, and clopidogrel.  He complains of dry mouth.  He has no chest pain but he feels that his heart rate is faster than it has been in the past.  No shortness of breath or leg swelling.  Past Medical History:  Diagnosis Date  . Asthma   . Chest pain    01/2011 in setting of HTN urgency: Chest CT demonstrated no pulmonary embolus, postoperative appearance following CABG and small left pleural effusion and dependent atelectasis in the left lung  . Coronary artery disease    CABG 12/12 (LIMA-LAD, SVG-DX, SVG-OM, SVG-PDA) c/b post op AFib Rx with amio Rx. s/p successful PCI of the right coronary artery utilizing overlapping drug-eluting stents in the mid and distal vessel (done after abnormal nuc)  . Diabetes mellitus   . Elevated hemoglobin ([place])   . Hyperlipidemia   . Hypertension     Past  Surgical History:  Procedure Laterality Date  . CARDIAC CATHETERIZATION    . CORONARY ARTERY BYPASS GRAFT  12/30/2010   Procedure: CORONARY ARTERY BYPASS GRAFTING (CABG);  Surgeon: Grace Isaac, MD;  Location: [facility];  Service: Open Heart Surgery;  Laterality: N/A;  . LEFT HEART CATHETERIZATION WITH CORONARY ANGIOGRAM N/A 01/12/2013   Procedure: LEFT HEART CATHETERIZATION WITH CORONARY ANGIOGRAM;  Surgeon: Blane Ohara, MD;  Location: [facility] CATH LAB;  Service: Cardiovascular;  Laterality: N/A;  . LEFT HEART CATHETERIZATION WITH CORONARY/GRAFT ANGIOGRAM  03/07/2011   Procedure: LEFT HEART CATHETERIZATION WITH Beatrix Fetters;  Surgeon: Sherren Mocha, MD;  Location[facility] LAB;  Service: Cardiovascular;;  . PERCUTANEOUS CORONARY STENT INTERVENTION (PCI-S)  03/07/2011   Procedure: PERCUTANEOUS CORONARY STENT INTERVENTION (PCI-S);  Surgeon: Sherren Mocha, MD;  Location: [facility] CATH LAB;  Service: Cardiovascular;;    Current Outpatient Medications  Medication Sig Dispense Refill  . amLODipine (NORVASC) 10 MG tablet Take 1 tablet (10 mg total) by mouth daily. 90 tablet 3  . aspirin 81 MG tablet Take 1 tablet (81 mg total) by mouth daily. 30 tablet 3  . carvedilol (COREG) 25 MG tablet Take 1 tablet (25 mg total) by mouth 2 (two) times daily with a meal. 180 tablet 3  . clopidogrel (PLAVIX) 75 MG tablet Take 1 tablet (75 mg total) by mouth daily. 90 tablet 3  . enalapril (VASOTEC)  10 MG tablet Take 1 tablet (10 mg total) by mouth 2 (two) times daily. 180 tablet 3  . glimepiride (AMARYL) 4 MG tablet Take 4 mg by mouth daily with breakfast.    . metFORMIN (GLUCOPHAGE) 1000 MG tablet Take 1,000 mg by mouth 2 (two) times daily with a meal.    . multivitamin-iron-minerals-folic acid (CENTRUM) chewable tablet Chew 1 tablet by mouth daily with breakfast.    . sildenafil (REVATIO) 20 MG tablet TAKE 2-5 TABLETS BY MOUTH AS NEEDED PRIOR  TO  SEXUAL  ACTIVITY 50 tablet 1  . SitaGLIPtin-MetFORMIN HCl  [id number] MG TB24 Take 1 tablet by mouth daily.    . nitroGLYCERIN (NITROSTAT) 0.4 MG SL tablet Place 1 tablet (0.4 mg total) under the tongue every 5 (five) minutes as needed. For chest pain 25 tablet 3  . pravastatin (PRAVACHOL) 40 MG tablet Take 1 tablet (40 mg total) by mouth every evening. 90 tablet 3   No current facility-administered medications for this visit.    Facility-Administered Medications Ordered in Other Visits  Medication Dose Route Frequency Provider Last Rate Last Dose  . 0.9 %  sodium chloride infusion  250 mL Intravenous Continuous Truitt Merle, MD   Stopped at 07/03/14 1523    Allergies:   Patient has no known allergies.   Social History:  The patient  reports that he has been smoking.  He has never used smokeless tobacco. He reports that he does not drink alcohol or use drugs.   Family History:  The patient's family history is not on file.    ROS:  Please see the history of present illness.  Otherwise, review of systems is positive for nosebleeds, depression, visual changes, back pain, snoring, constipation, headaches.  All other systems are reviewed and negative.    PHYSICAL EXAM: VS:  BP 132/80   Pulse (!) 106   Ht 5\' 6"  (1.676 m)   Wt 200 lb 9.6 oz (91 kg)   BMI 32.38 kg/m  , BMI Body mass index is 32.38 kg/m. GEN: Well nourished, well developed, in no acute distress  HEENT: normal  Neck: no JVD, no masses. No carotid bruits Cardiac: RRR without murmur or gallop                Respiratory:  clear to auscultation bilaterally, normal work of breathing GI: soft, nontender, nondistended, + BS MS: no deformity or atrophy  Ext: no pretibial edema, pedal pulses 2+= bilaterally Skin: warm and dry, no rash Neuro:  Strength and sensation are intact Psych: euthymic mood, full affect  EKG:  EKG is ordered today. The ekg ordered today shows sinus tachycardia 106 bpm, possible left atrial enlargement.  Recent Labs: 09/29/2016: HGB 14.8 04/13/2017: Platelet Count  275   Lipid Panel     Component Value Date/Time   CHOL 109 (L) 04/05/2015 0852   TRIG 81 04/05/2015 0852   HDL 32 (L) 04/05/2015 0852   CHOLHDL 3.4 04/05/2015 0852   VLDL 16 04/05/2015 0852   LDLCALC 61 04/05/2015 0852      Wt Readings from Last 3 Encounters:  04/17/17 200 lb 9.6 oz (91 kg)  09/29/16 207 lb 14.4 oz (94.3 kg)  04/14/16 207 lb 6.4 oz (94.1 kg)     Cardiac Studies Reviewed: Cardiac Cath 01-12-2013: Procedural Findings: Hemodynamics: AO 133/76 LV 132/6  Coronary angiography: Coronary dominance: right  Left mainstem: Patent without obstructive disease. Arises from the left cusp.  Left anterior descending (LAD): The LAD has moderate 50-60% proximal  vessel stenosis. There is severe stenosis of at least 80% at the first septal perforator. There is brisk competitive filling from the mammary graft beyond that. The first diagonal has severe 80% stenosis.  Left circumflex (LCx): The circumflex is seen to give off a single OM branch with diffuse 75% stenosis in the vault with tandem lesions. The AV circumflex is patent and supplies a small PLA branch. There is an OM branch there is not visualized and is known to be totally occluded.  Saphenous vein graft angiography: The saphenous vein graft obtuse marginal is patent with no stenosis identified. The OM vessel divides into twin vessels in its midportion.  Saphenous vein graft to first diagonal is patent with mild irregularity throughout.  The LIMA graft is poorly visualized. The left subclavian artery is patent. There is brisk competitive filling from the LIMA into the native LAD.  Right coronary artery (RCA): The native RCA is patent. This is a dominant vessel. There is 40-50% proximal stenosis. The long stented segment in the mid and distal vessel is widely patent with no evidence of restenosis. There is a very large PDA branch with no significant stenosis. There is a small PLA with mild disease noted.  Left  ventriculography: Left ventricular systolic function is normal, LVEF is estimated at 55%, there is no significant mitral regurgitation   Final Conclusions:   1. Severe native three-vessel coronary artery disease with continued patency of the native RCA stented segment, severe stenosis of the LAD with brisk filling from the mammary graft, and continued patency of the saphenous vein graft to left circumflex distribution  2. Status post aortocoronary bypass surgery with known occlusion of the saphenous vein graft to PDA, patency of the vein graft to OM and diagonal, and continued patency of the LIMA to LAD  3. Preserved left ventricular systolic function  Recommendations: Continued medical therapy. The patient has stable coronary anatomy.  ASSESSMENT AND PLAN: 1.  Coronary artery disease, native vessel and bypass graft disease, with angina: The patient appears stable.  I would favor continuation of dual antiplatelet therapy with aspirin and clopidogrel.  Will resume his beta-blocker (see below).  2.  Hypertension: I advised him to discontinue irbesartan/hydrochlorothiazide.  Will update his prescriptions to include amlodipine 10 mg, carvedilol 25 mg twice daily, and enalapril 10 mg twice daily. Difficult to know exactly which medications he's taking at home. I emphasized the importance of bringing all of his medicines with him to his next appointment.   3.  Hyperlipidemia: Treated with pravastatin.  4.  Type 2 diabetes: Patient is managed by his primary physician.  Lifestyle modification discussed.  Current medicines are reviewed with the patient today.  The patient does not have concerns regarding medicines.  Labs/ tests ordered today include:   Orders Placed This Encounter  Procedures  . EKG 12-Lead    Disposition:   FU 2-3 weeks Pharm[facility]  Signed, Sherren Mocha, MD  04/18/2017 12:45 PM    [address], [place], [place]   [address] Phone: [phone]; Fax: [phone]

## 2017-04-17 NOTE — Patient Instructions (Addendum)
Medication Instructions:  1) STOP IRBESARTAN/HCT 300/12.5 mg daily 2) START COREG (Carvedilol) 25 mg twice daily   Please contact your primary care physician to discuss your diabetic medications.  Labwork: None  Testing/Procedures: None  Follow-Up: Your provider recommends that you schedule a follow-up appointment in 6 weeks in the [place]. Please bring all your medications with you to this visit.  Any Other Special Instructions Will Be Listed Below (If Applicable).     If you need a refill on your cardiac medications before your next appointment, please call your pharmacy.

## 2017-04-21 ENCOUNTER — Telehealth: Payer: Self-pay

## 2017-04-21 NOTE — Telephone Encounter (Signed)
I have done a Sildenafil PA over the phone with Otila Kluver at [facility] [phone]). Per Otila Kluver we can call them back in 24 hours for decision.  Confirmation# [id number] Interreaction# [id number]

## 2017-04-22 NOTE — Telephone Encounter (Signed)
**Note De-Identified Gopal Malter Obfuscation** I called [employer] and s/w Patty. Per Patty this PA has been denied. Reason:  Pt must have a DX of Pulmonary Hypertension.  I have notified the pts pharmacy, [facility].

## 2017-05-16 NOTE — Progress Notes (Signed)
[facility] OFFICE PROGRESS NOTE  Elwyn Reach, MD [address] [name] [address]  DIAGNOSIS: Polycythemia, likely secondary to smoking and OSA     INTERVAL HISTORY: Keith Flores 57 y.o. male  who is here for further follow up for erythrocytosis.  He was last seen by Dr. Juliann Mule 6 month ago, and has not been followed due to his travel. Of note, he has an extensive cardiac history including CAD s/p CABG in 2010, Hypertension and hyperlipidemia for which he is being managed by cardiology. He had a lexiscan myoview recently due to test discomfort which was revealed the following: Low risk stress nuclear study with a moderate size, medium intensity, partially reversible inferior defect consistent with inferior thinning vs prior infarct and mild inferior ischemia. LV Ejection Fraction: 52%. LV Wall Motion: NL LV Function; NL Wall Motion. He was last seen by Dr. Sherren Mocha 12/2012.   He denies being told he had an elevated hemoglobin on prior CBCs. Looking through his lab values, he has had significant fluctuations with his hct being 53.6 to as low as 39 on 12/30/2010 to as high as 57.7 on 01/10/2013. He also denied feeling very itchy after taking a shower or bathing. He denied burning pain in his feet or cyanosis or discolorations. He also denied chest and abdominal pain, myalgia and weakness, fatigue, headache, blurred vision, transient loss of vision, paresthesias, slow mentation. He denied a history of blood clots or gout. He smoked about 6 cigarettes daily and smoked for many years prior. He denied any dysuria or hematuria or weight changes. He denied living in high altitudes or the use of testosterone.    CURRENT THERAPY: Phlebotomy to maintain HCT less than 45%. Changed cut off to 47% on 05/18/17.   INTERIM HISTORY  Keith Flores returns for follow-up, he is accompanied by an interpreter. He reports he was in [place] for 5 months and returned in Feb 2019.  He states he feels that his blood sugar is high but his meter at home is broken. He reports he smokes 6-10 cigarettes daily.   On review of systems, pt denies any other complaints at this time. Pertinent positives are listed and detailed within the above HPI.   MEDICAL HISTORY: Past Medical History:  Diagnosis Date  . Asthma   . Chest pain    01/2011 in setting of HTN urgency: Chest CT demonstrated no pulmonary embolus, postoperative appearance following CABG and small left pleural effusion and dependent atelectasis in the left lung  . Coronary artery disease    CABG 12/12 (LIMA-LAD, SVG-DX, SVG-OM, SVG-PDA) c/b post op AFib Rx with amio Rx. s/p successful PCI of the right coronary artery utilizing overlapping drug-eluting stents in the mid and distal vessel (done after abnormal nuc)  . Diabetes mellitus   . Elevated hemoglobin ([place])   . Hyperlipidemia   . Hypertension    ALLERGIES:  has No Known Allergies.  MEDICATIONS: has a current medication list which includes the following prescription(s): amlodipine, aspirin, carvedilol, clopidogrel, enalapril, glimepiride, metformin, multivitamin-iron-minerals-folic acid, nitroglycerin, pravastatin, and sildenafil, and the following Facility-Administered Medications: sodium chloride.  SURGICAL HISTORY:  Past Surgical History:  Procedure Laterality Date  . CARDIAC CATHETERIZATION    . CORONARY ARTERY BYPASS GRAFT  12/30/2010   Procedure: CORONARY ARTERY BYPASS GRAFTING (CABG);  Surgeon: Grace Isaac, MD;  Location: [facility];  Service: Open Heart Surgery;  Laterality: N/A;  . LEFT HEART CATHETERIZATION WITH CORONARY ANGIOGRAM N/A 01/12/2013   Procedure:  LEFT HEART CATHETERIZATION WITH CORONARY ANGIOGRAM;  Surgeon: Blane Ohara, MD;  Location: [facility] CATH LAB;  Service: Cardiovascular;  Laterality: N/A;  . LEFT HEART CATHETERIZATION WITH CORONARY/GRAFT ANGIOGRAM  03/07/2011   Procedure: LEFT HEART CATHETERIZATION WITH Beatrix Fetters;   Surgeon: Sherren Mocha, MD;  Location: [facility] CATH LAB;  Service: Cardiovascular;;  . PERCUTANEOUS CORONARY STENT INTERVENTION (PCI-S)  03/07/2011   Procedure: PERCUTANEOUS CORONARY STENT INTERVENTION (PCI-S);  Surgeon: Sherren Mocha, MD;  Location: [facility] CATH LAB;  Service: Cardiovascular;;    REVIEW OF SYSTEMS:   Constitutional: Denies fevers, chills or abnormal weight loss Eyes: Denies blurriness of vision Ears, nose, mouth, throat, and face: Denies mucositis or sore throat Respiratory: Denies cough, dyspnea or wheezes Cardiovascular: Denies palpitation, chest discomfort or lower extremity swelling Gastrointestinal:  Denies nausea, heartburn or change in bowel habits Skin: Denies abnormal skin rashes Lymphatics: Denies new lymphadenopathy or easy bruising Neurological:Denies numbness, tingling or new weaknesses Behavioral/Psych: Mood is stable, no new changes  All other systems were reviewed with the patient and are negative.  PHYSICAL EXAMINATION: ECOG PERFORMANCE STATUS: 0 - Asymptomatic  Blood pressure (!) 161/77, pulse 73, temperature 98.1 F (36.7 C), temperature source Oral, resp. rate 19, height '5\' 6"'$  (1.676 m), weight 204 lb 12.8 oz (92.9 kg), SpO2 99 %.   GENERAL:alert, no distress and comfortable; moderately obese SKIN: skin color, texture, turgor are normal, no rashes or significant lesions EYES: normal, Conjunctiva are pink and non-injected, sclera clear OROPHARYNX:no exudate, no erythema and lips, buccal mucosa, and tongue normal  NECK: supple, thyroid normal size, non-tender, without nodularity LYMPH:  no palpable lymphadenopathy in the cervical, axillary or supraclavicular LUNGS: clear to auscultation and percussion with normal breathing effort HEART: regular rate & rhythm and no murmurs and no lower extremity edema ABDOMEN:abdomen soft, non-tender and normal bowel sounds Musculoskeletal:no cyanosis of digits and no clubbing  NEURO: alert & oriented x 3 with fluent  speech, no focal motor/sensory deficits  LABORATORY DATA: CBC Latest Ref Rng & Units 05/18/2017 04/13/2017 03/16/2017  WBC 4.0 - 10.3 K/uL 6.7 6.4 8.1  Hemoglobin 13.0 - 17.1 g/dL 14.3 14.8 14.3  Hematocrit 38.4 - 49.9 % 46.2 47.3 45.3  Platelets 140 - 400 K/uL 228 275 279    CMP Latest Ref Rng & Units 07/23/2015 04/05/2015 03/21/2014  Glucose 70 - 140 mg/dl 269(H) 140(H) 133  BUN 7.0 - 26.0 mg/dL 13.4 13 18.5  Creatinine 0.7 - 1.3 mg/dL 1.0 0.90 1.0  Sodium 136 - 145 mEq/L 137 138 140  Potassium 3.5 - 5.1 mEq/L 3.6 3.9 4.0  Chloride 98 - 110 mmol/L - 104 -  CO2 22 - 29 mEq/L '26 24 25  '$ Calcium 8.4 - 10.4 mg/dL 9.5 9.5 9.7  Total Protein 6.4 - 8.3 g/dL 7.5 7.1 -  Total Bilirubin 0.20 - 1.20 mg/dL 0.77 0.7 -  Alkaline Phos 40 - 150 U/L 81 73 -  AST 5 - 34 U/L 21 19 -  ALT 0 - 55 U/L 35 28 -   Erythropoietin  Status: Finalresult Visible to patient:  Not Released Nextappt: 02/01/2014 at 09:00 AM in [facility] Room) Dx:  Polycythemia, secondary         Ref Range 36yrago    Erythropoietin 2.6 - 18.5 mIU/mL 13.7      PATHOLOGY REPORT 02/01/2014 Diagnosis Bone Marrow, Aspirate,Biopsy, and Clot, left iliac crest - SLIGHTLY HYPERCELLULAR BONE MARROW FOR AGE WITH TRILINEAGE HEMATOPOIESIS. - SEE COMMENT. PERIPHERAL BLOOD: - ERYTHROCYTOSIS. Diagnosis Note The overall  findings are not considered specific or diagnostic of a myeloproliferative neoplasm. Correlation with cytogenetic and JAK-2 mutation analysis is recommended.  Cytogenetics: normal   RADIOGRAPHIC STUDIES: 05/20/2013 CT ABDOMEN WITH CONTRAST TECHNIQUE:  Multidetector CT imaging of the abdomen was performed using the standard protocol following bolus administration of intravenous contrast. CONTRAST: 133m OMNIPAQUE IOHEXOL 300 MG/ML SOLN  COMPARISON: 04/12/2006 FINDINGS: The lung bases appear clear. No pleural or pericardial effusion.  No focal liver abnormality identified. The gallbladder is  normal. There is no biliary dilatation. The spleen is unremarkable measuring 10.2 cm in length. The adrenal glands are both normal. The right kidney is normal. Cyst within the upper pole of the left kidney is identified measuring 1.2 cm. Normal caliber of the abdominal aorta. No aneurysm. There is no upper abdominal adenopathy identified. The stomach is normal. The small bowel loops are unremarkable. Normal appearance of the colon. Review of the visualized osseous structures is significant for mild spondylosis. IMPRESSION: 1. No acute findings identified within the upper abdomen. 2. No mass or adenopathy and no evidence for splenomegaly.    ASSESSMENT:  Delynn SAntonieta Pertis a 57y.o. male with a history of CAD/CABG presents with persistence of elevated (H/H) without thrombocytosis, leukocytosis. He also has an elevated vitamin b12. CT of Abdomen (2008) was negative for splenomegaly. JAK2 was negative. Normal EPO level. He denies classic aquagenic pruritus, negative for erythromegalalia. Secondary causes due to R[place] HHavanaand steroids/testerone are felt to be less likely, but he dose have extensive smoking history.  1.  Polycythemia, secondary to smoking  --He only needs one major WHO criteria for [place], and does not meet any of the minor criteria. I reviewed his bone marrow biopsy with him, which does not support myeloproliferative disorder. JAK2 mutation was negative, his erythropoietin level was normal.  -I think his polycythemia is probable secondary to his extensive smoking history. Although he has never been hypoxic. -He had sleep study in July 2016. He was told by his primary care physicians that he has sleep apnea but does not need CPAP.  -Given his prior extensive history of coronary artery disease, I recommend continue phlebotomy to keep his hematocrit less than 45%. I do not recommend Hydrea.  -He will continue daily baby aspirin (81 mg) daily.  -I suggested checking his BG and  Liver/kidney function with His PCP twice yearly.  -He smokes 6-10 cigarettes a day. I again discussed that when he quits he will need less phlebotomies.  -Labs reviewed today, his HCT is 46.2. I changed his phlebotomy to HCT over 47% given this is secondary polycythemia and he had no history of thrombosis  -Lab in 1,3, and 5 months  -[place] f/u in 5 months before he leaves   2. CAD -continue follow up with his cardiologist Dr. CDellia Cloud 3. HTN, DM, hyperlipidemia -follow up with PCP  -he states that he feels like his sugar is high. His meter is broken and we do not have one here. I advised him to call his PCP's office as they might have a free meter to give him -I encouraged him to check his sugar at home and avoid excessive sugars  -I encouraged him to drink plenty of fluids   4. Smoking Cessation  - I again encouraged him to quit smoking and I offered [facility] free smoking cessation program  -I advised him to try nicotine patch.   PLAN: -Labs reviewed today,  his HCT is 46.2. I will change his phlebotomy cut off to HCT over 47% -Continue 81 mg ASA -Lab in 1,3, and 5 months  -He will be going to [place] in October again, we fill f/u in 5 months before he leaves   All questions were answered. The patient knows to call the clinic with any problems, questions or concerns. We can certainly see the patient much sooner if necessary.  I spent 15 minutes counseling the patient face to face in the presence of an arabic intepreter. The total time spent in the appointment was 20 minutes.  This document serves as a record of services personally performed by Truitt Merle, MD. It was created on her behalf by Theresia Bough, a trained medical scribe. The creation of this record is based on the scribe's personal observations and the provider's statements to them.   I have reviewed the above documentation for accuracy and completeness, and I agree with the  above.   Truitt Merle, MD 05/18/2017

## 2017-05-18 ENCOUNTER — Encounter: Payer: Self-pay | Admitting: Hematology

## 2017-05-18 ENCOUNTER — Inpatient Hospital Stay: Payer: Medicaid Other

## 2017-05-18 ENCOUNTER — Inpatient Hospital Stay: Payer: Medicaid Other | Attending: Hematology | Admitting: Hematology

## 2017-05-18 ENCOUNTER — Telehealth: Payer: Self-pay | Admitting: Hematology

## 2017-05-18 VITALS — BP 161/77 | HR 73 | Temp 98.1°F | Resp 19 | Ht 66.0 in | Wt 204.8 lb

## 2017-05-18 DIAGNOSIS — I2510 Atherosclerotic heart disease of native coronary artery without angina pectoris: Secondary | ICD-10-CM

## 2017-05-18 DIAGNOSIS — E785 Hyperlipidemia, unspecified: Secondary | ICD-10-CM | POA: Insufficient documentation

## 2017-05-18 DIAGNOSIS — F17210 Nicotine dependence, cigarettes, uncomplicated: Secondary | ICD-10-CM | POA: Insufficient documentation

## 2017-05-18 DIAGNOSIS — D751 Secondary polycythemia: Secondary | ICD-10-CM | POA: Insufficient documentation

## 2017-05-18 DIAGNOSIS — Z7982 Long term (current) use of aspirin: Secondary | ICD-10-CM | POA: Insufficient documentation

## 2017-05-18 DIAGNOSIS — I10 Essential (primary) hypertension: Secondary | ICD-10-CM | POA: Diagnosis not present

## 2017-05-18 DIAGNOSIS — E119 Type 2 diabetes mellitus without complications: Secondary | ICD-10-CM | POA: Diagnosis not present

## 2017-05-18 DIAGNOSIS — E875 Hyperkalemia: Secondary | ICD-10-CM

## 2017-05-18 DIAGNOSIS — Z951 Presence of aortocoronary bypass graft: Secondary | ICD-10-CM | POA: Diagnosis not present

## 2017-05-18 LAB — CBC WITH DIFFERENTIAL (CANCER CENTER ONLY)
Basophils Absolute: 0 10*3/uL (ref 0.0–0.1)
Basophils Relative: 1 %
Eosinophils Absolute: 0.3 10*3/uL (ref 0.0–0.5)
Eosinophils Relative: 5 %
HEMATOCRIT: 46.2 % (ref 38.4–49.9)
Hemoglobin: 14.3 g/dL (ref 13.0–17.1)
Lymphocytes Relative: 37 %
Lymphs Abs: 2.5 10*3/uL (ref 0.9–3.3)
MCH: 23.1 pg — ABNORMAL LOW (ref 27.2–33.4)
MCHC: 31 g/dL — ABNORMAL LOW (ref 32.0–36.0)
MCV: 74.8 fL — ABNORMAL LOW (ref 79.3–98.0)
MONO ABS: 0.4 10*3/uL (ref 0.1–0.9)
Monocytes Relative: 6 %
NEUTROS ABS: 3.5 10*3/uL (ref 1.5–6.5)
NEUTROS PCT: 51 %
Platelet Count: 228 10*3/uL (ref 140–400)
RBC: 6.18 MIL/uL — ABNORMAL HIGH (ref 4.20–5.82)
RDW: 16 % — AB (ref 11.0–14.6)
WBC Count: 6.7 10*3/uL (ref 4.0–10.3)

## 2017-05-18 NOTE — Telephone Encounter (Signed)
Appointments scheduled AVS/Calendar printed per 4/29 los. Patient requested NOT to be scheduled during Ramadan Month of May

## 2017-05-28 ENCOUNTER — Ambulatory Visit: Payer: Medicaid Other

## 2017-06-20 ENCOUNTER — Other Ambulatory Visit: Payer: Self-pay | Admitting: Cardiovascular Disease

## 2017-06-24 ENCOUNTER — Other Ambulatory Visit: Payer: Medicaid Other

## 2017-06-29 ENCOUNTER — Inpatient Hospital Stay: Payer: Medicaid Other | Attending: Hematology

## 2017-06-29 ENCOUNTER — Inpatient Hospital Stay: Payer: Medicaid Other

## 2017-06-29 VITALS — BP 135/71 | HR 83 | Temp 98.0°F | Resp 18

## 2017-06-29 DIAGNOSIS — D751 Secondary polycythemia: Secondary | ICD-10-CM | POA: Insufficient documentation

## 2017-06-29 LAB — CBC WITH DIFFERENTIAL (CANCER CENTER ONLY)
BASOS ABS: 0.1 10*3/uL (ref 0.0–0.1)
BASOS PCT: 1 %
EOS ABS: 0.3 10*3/uL (ref 0.0–0.5)
EOS PCT: 6 %
HCT: 47.8 % (ref 38.4–49.9)
Hemoglobin: 15.1 g/dL (ref 13.0–17.1)
Lymphocytes Relative: 26 %
Lymphs Abs: 1.5 10*3/uL (ref 0.9–3.3)
MCH: 22.9 pg — ABNORMAL LOW (ref 27.2–33.4)
MCHC: 31.6 g/dL — ABNORMAL LOW (ref 32.0–36.0)
MCV: 72.6 fL — ABNORMAL LOW (ref 79.3–98.0)
Monocytes Absolute: 0.3 10*3/uL (ref 0.1–0.9)
Monocytes Relative: 6 %
Neutro Abs: 3.5 10*3/uL (ref 1.5–6.5)
Neutrophils Relative %: 61 %
PLATELETS: 224 10*3/uL (ref 140–400)
RBC: 6.58 MIL/uL — ABNORMAL HIGH (ref 4.20–5.82)
RDW: 20.4 % — AB (ref 11.0–14.6)
WBC Count: 5.7 10*3/uL (ref 4.0–10.3)

## 2017-06-29 NOTE — Patient Instructions (Signed)

## 2017-07-06 ENCOUNTER — Ambulatory Visit (INDEPENDENT_AMBULATORY_CARE_PROVIDER_SITE_OTHER): Payer: Medicaid Other | Admitting: Pharmacist

## 2017-07-06 VITALS — BP 140/80 | HR 81

## 2017-07-06 DIAGNOSIS — I10 Essential (primary) hypertension: Secondary | ICD-10-CM

## 2017-07-06 MED ORDER — ENALAPRIL MALEATE 20 MG PO TABS: 20.0000 mg | ORAL_TABLET | Freq: Two times a day (BID) | ORAL | 3 refills | Status: AC

## 2017-07-06 NOTE — Patient Instructions (Addendum)
It was nice to meet you today  Your blood pressure goal is < 130/80  Increase your enalapril to 20mg  twice a day. You can take 2 of your 10mg  tablets twice a day until you run out, then pick up new prescription for the 20mg  tablets and take 1 tablet twice a day  Continue taking your other medications  Follow up in clinic in 3-4 weeks for blood pressure check and lab work

## 2017-07-06 NOTE — Progress Notes (Signed)
Patient ID: Keith Flores                 DOB: 1960/04/22                      MRN: [id number]     HPI: Keith Flores is a 57 y.o. male referred by Dr. Burt Knack to HTN clinic. PMH is significant for CAD s/p CABG in 2010, HTN, DM2, and HLD. When pt saw Dr Burt Knack 2 months ago, he was taking both irbesartan and enalapril. He was changed from irbesartan-HCTZ 300-12.5mg  to carvedilol 25mg  BID.  Patient presents today in good spirits. He is not accompanied by a translator however pt has good comprehension for visit conducted today in [place]. He brings in all of his home medications and reports compliance. He denies dizziness, blurred vision, falls, or headache. He checks his BP at home occasionally and recalls readings in the 150/70 range. BP is slightly higher in his left arm at 140/80. BP in right arm 136/80 on first check, then 126/78 on recheck.  Current HTN meds: amlodipine 10mg  daily, carvedilol 25mg  BID, enalapril 10mg  BID  BP goal: <130/29mmHg  Family History:  The patient's family history is not on file.   Social History: The patient  reports that he has been smoking.  He has never used smokeless tobacco. He reports that he does not drink alcohol or use drugs.   Diet: 1 cup of coffee a day. Breakfast - cereal with milk. Lunch - chicken. Dinner - yogurt and banana.  Exercise: Walks a 1/2 mile most days a week.  Home BP readings: 150/70  Wt Readings from Last 3 Encounters:  05/18/17 204 lb 12.8 oz (92.9 kg)  04/17/17 200 lb 9.6 oz (91 kg)  09/29/16 207 lb 14.4 oz (94.3 kg)   BP Readings from Last 3 Encounters:  06/29/17 135/71  05/18/17 (!) 161/77  04/17/17 132/80   Pulse Readings from Last 3 Encounters:  06/29/17 83  05/18/17 73  04/17/17 (!) 106    Renal function: CrCl cannot be calculated (Patient's most recent lab result is older than the maximum 21 days allowed.).  Past Medical History:  Diagnosis Date  . Asthma   . Chest pain    01/2011 in setting of  HTN urgency: Chest CT demonstrated no pulmonary embolus, postoperative appearance following CABG and small left pleural effusion and dependent atelectasis in the left lung  . Coronary artery disease    CABG 12/12 (LIMA-LAD, SVG-DX, SVG-OM, SVG-PDA) c/b post op AFib Rx with amio Rx. s/p successful PCI of the right coronary artery utilizing overlapping drug-eluting stents in the mid and distal vessel (done after abnormal nuc)  . Diabetes mellitus   . Elevated hemoglobin ([place])   . Hyperlipidemia   . Hypertension     Current Outpatient Medications on File Prior to Visit  Medication Sig Dispense Refill  . amLODipine (NORVASC) 10 MG tablet TAKE 1 TABLET BY MOUTH ONCE DAILY 30 tablet 11  . aspirin 81 MG tablet Take 1 tablet (81 mg total) by mouth daily. 30 tablet 3  . carvedilol (COREG) 25 MG tablet Take 1 tablet (25 mg total) by mouth 2 (two) times daily with a meal. 180 tablet 3  . clopidogrel (PLAVIX) 75 MG tablet Take 1 tablet (75 mg total) by mouth daily. 90 tablet 3  . enalapril (VASOTEC) 10 MG tablet Take 1 tablet (10 mg total) by mouth 2 (two) times daily. 180 tablet 3  . glimepiride (  AMARYL) 4 MG tablet Take 4 mg by mouth daily with breakfast.    . metFORMIN (GLUCOPHAGE) 1000 MG tablet Take 1,000 mg by mouth 2 (two) times daily with a meal.    . multivitamin-iron-minerals-folic acid (CENTRUM) chewable tablet Chew 1 tablet by mouth daily with breakfast.    . nitroGLYCERIN (NITROSTAT) 0.4 MG SL tablet Place 1 tablet (0.4 mg total) under the tongue every 5 (five) minutes as needed. For chest pain 25 tablet 3  . pravastatin (PRAVACHOL) 40 MG tablet Take 1 tablet (40 mg total) by mouth every evening. 90 tablet 3  . sildenafil (REVATIO) 20 MG tablet TAKE 2-5 TABLETS BY MOUTH AS NEEDED PRIOR  TO  SEXUAL  ACTIVITY 50 tablet 1  . SitaGLIPtin-MetFORMIN HCl [phone] MG TB24 Take 1 tablet by mouth daily.     Current Facility-Administered Medications on File Prior to Visit  Medication Dose Route  Frequency Provider Last Rate Last Dose  . 0.9 %  sodium chloride infusion  250 mL Intravenous Continuous Truitt Merle, MD   Stopped at 07/03/14 1523    No Known Allergies   Assessment/Plan:  1. Hypertension - BP above goal <130/39mmHg in left arm and at home, right arm at goal <130/27mmHg. Will increase enalapril to 20mg  BID and continue amlodipine 10mg  daily and carvedilol 25mg  BID. Pt advised to monitor his BP in the next few weeks. Will f/u in HTN clinic in 3 weeks for BP check and BMET.   Courtney Fenlon E. Taniya Dasher, PharmD, BCACP, [place] [address], [place], [place] [address] Phone: [phone]; Fax: [phone] 07/06/2017 1:42 PM

## 2017-07-08 ENCOUNTER — Other Ambulatory Visit: Payer: Self-pay | Admitting: Cardiovascular Disease

## 2017-07-26 ENCOUNTER — Emergency Department (HOSPITAL_BASED_OUTPATIENT_CLINIC_OR_DEPARTMENT_OTHER): Payer: Medicaid Other

## 2017-07-26 ENCOUNTER — Emergency Department (HOSPITAL_COMMUNITY)
Admission: EM | Admit: 2017-07-26 | Discharge: 2017-07-26 | Disposition: A | Discharge status: Home / Self Care | Payer: Medicaid Other | Attending: Emergency Medicine | Admitting: Emergency Medicine

## 2017-07-26 ENCOUNTER — Emergency Department (HOSPITAL_COMMUNITY): Payer: Medicaid Other

## 2017-07-26 ENCOUNTER — Other Ambulatory Visit: Payer: Self-pay

## 2017-07-26 ENCOUNTER — Encounter (HOSPITAL_COMMUNITY): Payer: Self-pay

## 2017-07-26 DIAGNOSIS — E119 Type 2 diabetes mellitus without complications: Secondary | ICD-10-CM | POA: Insufficient documentation

## 2017-07-26 DIAGNOSIS — F17210 Nicotine dependence, cigarettes, uncomplicated: Secondary | ICD-10-CM | POA: Insufficient documentation

## 2017-07-26 DIAGNOSIS — M79609 Pain in unspecified limb: Secondary | ICD-10-CM | POA: Diagnosis not present

## 2017-07-26 DIAGNOSIS — M25561 Pain in right knee: Secondary | ICD-10-CM | POA: Diagnosis not present

## 2017-07-26 DIAGNOSIS — I10 Essential (primary) hypertension: Secondary | ICD-10-CM | POA: Diagnosis not present

## 2017-07-26 DIAGNOSIS — I2510 Atherosclerotic heart disease of native coronary artery without angina pectoris: Secondary | ICD-10-CM | POA: Insufficient documentation

## 2017-07-26 DIAGNOSIS — J45909 Unspecified asthma, uncomplicated: Secondary | ICD-10-CM | POA: Insufficient documentation

## 2017-07-26 DIAGNOSIS — M79604 Pain in right leg: Secondary | ICD-10-CM | POA: Diagnosis present

## 2017-07-26 MED ORDER — TRAMADOL HCL 50 MG PO TABS
50.0000 mg | ORAL_TABLET | Freq: Once | ORAL | Status: AC
  Administered 2017-07-26: 50 mg via ORAL
  Filled 2017-07-26: qty 1

## 2017-07-26 MED ORDER — TRAMADOL HCL 50 MG PO TABS: 50.0000 mg | ORAL_TABLET | Freq: Four times a day (QID) | ORAL | 0 refills | Status: AC | PRN

## 2017-07-26 NOTE — Discharge Instructions (Signed)

## 2017-07-26 NOTE — Progress Notes (Signed)
VASCULAR LAB PRELIMINARY  PRELIMINARY  PRELIMINARY  PRELIMINARY  Right lower extremity venous duplex completed.    Preliminary report:  There is no DVT or SVT noted in the right lower extremity.  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  Gave report to Dr. Phill Myron, [facility], RVT 07/26/2017, 6:32 PM

## 2017-07-26 NOTE — ED Triage Notes (Addendum)
EMS reports from home, c/o increasing right leg pain, states he cannot bear weight on it, Denies trauma, no obvious visible injury. Pt takes Plavix.

## 2017-07-26 NOTE — ED Provider Notes (Signed)
Emergency Department Provider Note   I have reviewed the triage vital signs and the nursing notes.   HISTORY  Chief Complaint Knee Pain   HPI Keith Flores is a 57 y.o. male with PMH of DM, HLD, and HTNpresents to the emergency department for evaluation of right knee/leg pain for the past week.  Patient denies any injury.  Pain is worse with standing on the leg.  He describes pain over the right lateral knee and into the lower thigh.  He also reports some posterior knee discomfort.  No leg swelling.  He denies any chest pain or shortness of breath.  No prior history of DVT.  No other modifying factors.  Past Medical History:  Diagnosis Date  . Asthma   . Chest pain    01/2011 in setting of HTN urgency: Chest CT demonstrated no pulmonary embolus, postoperative appearance following CABG and small left pleural effusion and dependent atelectasis in the left lung  . Coronary artery disease    CABG 12/12 (LIMA-LAD, SVG-DX, SVG-OM, SVG-PDA) c/b post op AFib Rx with amio Rx. s/p successful PCI of the right coronary artery utilizing overlapping drug-eluting stents in the mid and distal vessel (done after abnormal nuc)  . Diabetes mellitus   . Elevated hemoglobin ([place])   . Hyperlipidemia   . Hypertension     Patient Active Problem List   Diagnosis Date Noted  . Polycythemia, secondary 04/30/2014  . Palpitations 03/04/2011  . Diabetes mellitus 02/20/2011  . Chest pain 02/18/2011  . Hypertensive urgency 02/18/2011  . Coronary atherosclerosis of native coronary artery 01/16/2011  . Essential hypertension, benign 01/16/2011  . Mixed hyperlipidemia 01/16/2011  . Atrial fibrillation ([place]) 01/16/2011  . S/P CABG x 4 12/30/2010    Class: Acute    Past Surgical History:  Procedure Laterality Date  . CARDIAC CATHETERIZATION    . CORONARY ARTERY BYPASS GRAFT  12/30/2010   Procedure: CORONARY ARTERY BYPASS GRAFTING (CABG);  Surgeon: Grace Isaac, MD;  Location: [place];   Service: Open Heart Surgery;  Laterality: N/A;  . LEFT HEART CATHETERIZATION WITH CORONARY ANGIOGRAM N/A 01/12/2013   Procedure: LEFT HEART CATHETERIZATION WITH CORONARY ANGIOGRAM;  Surgeon: Blane Ohara, MD;  Location: [facility] CATH LAB;  Service: Cardiovascular;  Laterality: N/A;  . LEFT HEART CATHETERIZATION WITH CORONARY/GRAFT ANGIOGRAM  03/07/2011   Procedure: LEFT HEART CATHETERIZATION WITH Beatrix Fetters;  Surgeon: Sherren Mocha, MD;  Location: [facility] CATH LAB;  Service: Cardiovascular;;  . PERCUTANEOUS CORONARY STENT INTERVENTION (PCI-S)  03/07/2011   Procedure: PERCUTANEOUS CORONARY STENT INTERVENTION (PCI-S);  Surgeon: Sherren Mocha, MD;  Location: [facility] CATH LAB;  Service: Cardiovascular;;    Allergies Patient has no known allergies.  Family History  Problem Relation Age of Onset  . Anesthesia problems Neg Hx   . Hypotension Neg Hx   . Malignant hyperthermia Neg Hx   . Pseudochol deficiency Neg Hx     Social History Social History   Tobacco Use  . Smoking status: Current Some Day Smoker    Last attempt to quit: 10/21/2010    Years since quitting: 6.7  . Smokeless tobacco: Never Used  Substance Use Topics  . Alcohol use: No  . Drug use: No    Review of Systems  Constitutional: No fever/chills Eyes: No visual changes. ENT: No sore throat. Cardiovascular: Denies chest pain. Respiratory: Denies shortness of breath. Gastrointestinal: No abdominal pain.  No nausea, no vomiting.  No diarrhea.  No constipation. Genitourinary: Negative for dysuria. Musculoskeletal: Negative for back  pain. Positive right knee/leg pain.  Skin: Negative for rash. Neurological: Negative for headaches, focal weakness or numbness.  10-point ROS otherwise negative.  ____________________________________________   PHYSICAL EXAM:  VITAL SIGNS: ED Triage Vitals  Enc Vitals Group     BP 07/26/17 1657 (!) 150/70     Pulse Rate 07/26/17 1657 80     Resp 07/26/17 1657 16     Temp  07/26/17 1657 98.1 F (36.7 C)     Temp src --      SpO2 07/26/17 1657 96 %     Pain Score 07/26/17 1653 6   Constitutional: Alert and oriented. Well appearing and in no acute distress. Eyes: Conjunctivae are normal.  Head: Atraumatic. Nose: No congestion/rhinnorhea. Mouth/Throat: Mucous membranes are moist.  Neck: No stridor.  Cardiovascular: Normal rate, regular rhythm. Good peripheral circulation. Grossly normal heart sounds.   Respiratory: Normal respiratory effort.  Gastrointestinal:  No distention.  Musculoskeletal: Pain over the right lateral and posterior knee. No cellulitis. Normal ROM of the right knee and hip. No abscess.  Neurologic:  Normal speech and language. No gross focal neurologic deficits are appreciated.  Skin:  Skin is warm, dry and intact. No rash noted.  ____________________________________________  RADIOLOGY  Dg Knee 2 Views Right  Result Date: 07/26/2017 CLINICAL DATA:  Increasing RIGHT hip pain radiating to RIGHT knee EXAM: RIGHT KNEE - 1-2 VIEW COMPARISON:  None FINDINGS: Osseous mineralization normal. Joint spaces preserved. No acute fracture, dislocation, or bone destruction. Patellar spur formation at quadriceps tendon insertion. No joint effusion. IMPRESSION: No acute osseous abnormalities. Electronically Signed   By: Lavonia Dana M.D.   On: 07/26/2017 17:41   Dg Hip Unilat W Or Wo Pelvis 2-3 Views Right  Result Date: 07/26/2017 CLINICAL DATA:  Increasing RIGHT hip pain radiating to RIGHT knee, cannot bear weight, denies injury EXAM: DG HIP (WITH OR WITHOUT PELVIS) 2-3V RIGHT COMPARISON:  Nonee FINDINGS: SI joints preserved. Hip joints appear preserved though there is spur formation at the femoral heads bilaterally. Osseous mineralization normal. No acute fracture, dislocation, or bone destruction. IMPRESSION: Mild degenerative changes of the hip joints. No acute abnormalities. Electronically Signed   By: Lavonia Dana M.D.   On: 07/26/2017 17:40     ____________________________________________   PROCEDURES  Procedure(s) performed:   Procedures  None  ____________________________________________   INITIAL IMPRESSION / ASSESSMENT AND PLAN / ED COURSE  Pertinent labs & imaging results that were available during my care of the patient were reviewed by me and considered in my medical decision making (see chart for details).  Patient presents to the emergency department for evaluation of right leg/knee pain.  He has no clinical signs or symptoms to suggest septic arthritis.  No known injury to the knee.  Some discomfort in the lower thigh as well.  Plan for plain films of the right knee and hip along with venous ultrasound of the right lower extremity to rule out DVT. No CP/SOB symptoms.   No DVT on [place]. Normal plain film. Patient given crutches and plan for outpatient f/u with PCP and ortho referral from there if pain worsens.   At this time, I do not feel there is any life-threatening condition present. I have reviewed and discussed all results (EKG, imaging, lab, urine as appropriate), exam findings with patient. I have reviewed nursing notes and appropriate previous records.  I feel the patient is safe to be discharged home without further emergent workup. Discussed usual and customary return precautions. Patient and family (  if present) verbalize understanding and are comfortable with this plan.  Patient will follow-up with their primary care provider. If they do not have a primary care provider, information for follow-up has been provided to them. All questions have been answered.  ____________________________________________  FINAL CLINICAL IMPRESSION(S) / ED DIAGNOSES  Final diagnoses:  Acute pain of right knee    MEDICATIONS GIVEN DURING THIS VISIT:  Medications  traMADol (ULTRAM) tablet 50 mg (50 mg Oral Given 07/26/17 1843)    Note:  This document was prepared using Dragon voice recognition software and may include  unintentional dictation errors.  Nanda Quinton, MD Emergency Medicine    Long, [facility], MD 07/26/17 [phone]

## 2017-07-26 NOTE — ED Notes (Signed)
Bed: [facility] Expected date:  Expected time:  Means of arrival:  Comments: Leg pain

## 2017-07-29 ENCOUNTER — Inpatient Hospital Stay: Payer: Medicaid Other | Attending: Hematology

## 2017-07-29 ENCOUNTER — Inpatient Hospital Stay: Payer: Medicaid Other

## 2017-07-29 DIAGNOSIS — D751 Secondary polycythemia: Secondary | ICD-10-CM | POA: Diagnosis present

## 2017-07-29 LAB — CBC WITH DIFFERENTIAL (CANCER CENTER ONLY)
BASOS PCT: 1 %
Basophils Absolute: 0 10*3/uL (ref 0.0–0.1)
EOS ABS: 0.4 10*3/uL (ref 0.0–0.5)
Eosinophils Relative: 6 %
HCT: 48.1 % (ref 38.4–49.9)
HEMOGLOBIN: 15.4 g/dL (ref 13.0–17.1)
Lymphocytes Relative: 28 %
Lymphs Abs: 1.8 10*3/uL (ref 0.9–3.3)
MCH: 23.3 pg — ABNORMAL LOW (ref 27.2–33.4)
MCHC: 31.9 g/dL — AB (ref 32.0–36.0)
MCV: 72.9 fL — ABNORMAL LOW (ref 79.3–98.0)
MONOS PCT: 7 %
Monocytes Absolute: 0.5 10*3/uL (ref 0.1–0.9)
NEUTROS PCT: 58 %
Neutro Abs: 3.6 10*3/uL (ref 1.5–6.5)
PLATELETS: 230 10*3/uL (ref 140–400)
RBC: 6.6 MIL/uL — ABNORMAL HIGH (ref 4.20–5.82)
RDW: 19.6 % — ABNORMAL HIGH (ref 11.0–14.6)
WBC Count: 6.2 10*3/uL (ref 4.0–10.3)

## 2017-07-29 NOTE — Progress Notes (Signed)
HCT of 48.1. Therapeutic phlebotomy performed with 16g to the R AC. Phlebotomy initiated at 1505. Phlebotomy ended at 1512. 541 grams phlebotomized. Pt tolerated well. Snack and drink offered, drink provided. Pt stayed for 3min observation.

## 2017-07-29 NOTE — Patient Instructions (Signed)
Therapeutic Phlebotomy Discharge Instructions  - Increase your fluid intake over the next 4 hours  - No smoking for 30 minutes  - Avoid using the affected arm (the one you had the blood drawn from) for heavy lifting or other activities.  - You may resume all normal activities after 30 minutes.  You are to notify the office if you experience:   - Persistent dizziness and/or lightheadedness -Uncontrolled or excessive bleeding at the site.   Therapeutic Phlebotomy, Care After Refer to this sheet in the next few weeks. These instructions provide you with information about caring for yourself after your procedure. Your health care provider may also give you more specific instructions. Your treatment has been planned according to current medical practices, but problems sometimes occur. Call your health care provider if you have any problems or questions after your procedure. What can I expect after the procedure? After the procedure, it is common to have:  Light-headedness or dizziness. You may feel faint.  Nausea.  Tiredness.  Follow these instructions at home: Activity  Return to your normal activities as directed by your health care provider. Most people can go back to their normal activities right away.  Avoid strenuous physical activity and heavy lifting or pulling for about 5 hours after the procedure. Do not lift anything that is heavier than 10 lb (4.5 kg).  Athletes should avoid strenuous exercise for at least 12 hours.  Change positions slowly for the remainder of the day. This will help to prevent light-headedness or fainting.  If you feel light-headed, lie down until the feeling goes away. Eating and drinking  Be sure to eat well-balanced meals for the next 24 hours.  Drink enough fluid to keep your urine clear or pale yellow.  Avoid drinking alcohol on the day that you had the procedure. Care of the Needle Insertion Site  Keep your bandage dry. You can remove the  bandage after about 5 hours or as directed by your health care provider.  If you have bleeding from the needle insertion site, elevate your arm and press firmly on the site until the bleeding stops.  If you have bruising at the site, apply ice to the area: ? Put ice in a plastic bag. ? Place a towel between your skin and the bag. ? Leave the ice on for 20 minutes, 2-3 times a day for the first 24 hours.  If the swelling does not go away after 24 hours, apply a warm, moist washcloth to the area for 20 minutes, 2-3 times a day. General instructions  Avoid smoking for at least 30 minutes after the procedure.  Keep all follow-up visits as directed by your health care provider. It is important to continue with further therapeutic phlebotomy treatments as directed. Contact a health care provider if:  You have redness, swelling, or pain at the needle insertion site.  You have fluid, blood, or pus coming from the needle insertion site.  You feel light-headed, dizzy, or nauseated, and the feeling does not go away.  You notice new bruising at the needle insertion site.  You feel weaker than normal.  You have a fever or chills. Get help right away if:  You have severe nausea or vomiting.  You have chest pain.  You have trouble breathing. This information is not intended to replace advice given to you by your health care provider. Make sure you discuss any questions you have with your health care provider. Document Released: 06/10/2010 Document Revised: 09/08/2015   Document Reviewed: 01/02/2014 Elsevier Interactive Patient Education  2018 [employer]   

## 2017-07-30 ENCOUNTER — Ambulatory Visit: Payer: Medicaid Other | Admitting: Pharmacist

## 2017-07-30 ENCOUNTER — Other Ambulatory Visit: Payer: Medicaid Other

## 2017-07-30 NOTE — Progress Notes (Deleted)
Patient ID: [name]                 DOB: [birth year]                      MRN: [id number]     HPI: Keith Flores is a 57 y.o. male referred by Dr. Burt Flores to HTN clinic. PMH is significant for CAD s/p CABG in 2010, HTN, DM2, and HLD. When pt saw Dr Keith Flores 2 months ago, he was taking both irbesartan and enalapril. He was changed from irbesartan-HCTZ 300-12.5mg  to carvedilol 25mg  BID. At last visit, enalapril was increased to 20mg  BID. The BP in his left arm was above goal <130/56mmHg however the pressure in his right arm was at goal. Pt presents today for follow up.  bmet today Check bp in both arms - left is higher Can start chlorthalidone if needed and bmet is stable  Patient presents today in good spirits. He is not accompanied by a translator however pt has good comprehension for visit conducted today in [place]. He brings in all of his home medications and reports compliance. He denies dizziness, blurred vision, falls, or headache. He checks his BP at home occasionally and recalls readings in the 150/70 range. BP is slightly higher in his left arm at 140/80. BP in right arm 136/80 on first check, then 126/78 on recheck.  Current HTN meds: amlodipine 10mg  daily, carvedilol 25mg  BID, enalapril 20mg  BID  BP goal: <130/5mmHg  Family History:  The patient's family history is not on file.   Social History: The patient  reports that he has been smoking.  He has never used smokeless tobacco. He reports that he does not drink alcohol or use drugs.   Diet: 1 cup of coffee a day. Breakfast - cereal with milk. Lunch - chicken. Dinner - yogurt and banana.  Exercise: Walks a 1/2 mile most days a week.  Home BP readings: 150/70  Wt Readings from Last 3 Encounters:  05/18/17 204 lb 12.8 oz (92.9 kg)  04/17/17 200 lb 9.6 oz (91 kg)  09/29/16 207 lb 14.4 oz (94.3 kg)   BP Readings from Last 3 Encounters:  07/29/17 (!) 142/71  07/26/17 (!) 150/70  07/06/17 140/80   Pulse  Readings from Last 3 Encounters:  07/29/17 73  07/26/17 80  07/06/17 81    Renal function: CrCl cannot be calculated (Patient's most recent lab result is older than the maximum 21 days allowed.).  Past Medical History:  Diagnosis Date  . Asthma   . Chest pain    01/2011 in setting of HTN urgency: Chest CT demonstrated no pulmonary embolus, postoperative appearance following CABG and small left pleural effusion and dependent atelectasis in the left lung  . Coronary artery disease    CABG 12/12 (LIMA-LAD, SVG-DX, SVG-OM, SVG-PDA) c/b post op AFib Rx with amio Rx. s/p successful PCI of the right coronary artery utilizing overlapping drug-eluting stents in the mid and distal vessel (done after abnormal nuc)  . Diabetes mellitus   . Elevated hemoglobin ([place])   . Hyperlipidemia   . Hypertension     Current Outpatient Medications on File Prior to Visit  Medication Sig Dispense Refill  . amLODipine (NORVASC) 10 MG tablet TAKE 1 TABLET BY MOUTH ONCE DAILY 30 tablet 11  . aspirin 81 MG tablet Take 1 tablet (81 mg total) by mouth daily. 30 tablet 3  . carvedilol (COREG) 25 MG tablet Take 1 tablet (25 mg total)  by mouth 2 (two) times daily with a meal. 180 tablet 3  . clopidogrel (PLAVIX) 75 MG tablet Take 1 tablet (75 mg total) by mouth daily. 90 tablet 3  . enalapril (VASOTEC) 20 MG tablet Take 1 tablet (20 mg total) by mouth 2 (two) times daily. 180 tablet 3  . glimepiride (AMARYL) 4 MG tablet Take 4 mg by mouth daily with breakfast.    . metFORMIN (GLUCOPHAGE) 1000 MG tablet Take 1,000 mg by mouth 2 (two) times daily with a meal.    . multivitamin-iron-minerals-folic acid (CENTRUM) chewable tablet Chew 1 tablet by mouth daily with breakfast.    . nitroGLYCERIN (NITROSTAT) 0.4 MG SL tablet Place 1 tablet (0.4 mg total) under the tongue every 5 (five) minutes as needed. For chest pain 25 tablet 3  . pravastatin (PRAVACHOL) 40 MG tablet Take 1 tablet (40 mg total) by mouth every evening. 90  tablet 3  . sildenafil (REVATIO) 20 MG tablet TAKE 2-5 TABLETS BY MOUTH AS NEEDED PRIOR  TO  SEXUAL  ACTIVITY (Patient not taking: Reported on 07/26/2017) 50 tablet 1  . traMADol (ULTRAM) 50 MG tablet Take 1 tablet (50 mg total) by mouth every 6 (six) hours as needed. 10 tablet 0   Current Facility-Administered Medications on File Prior to Visit  Medication Dose Route Frequency Provider Last Rate Last Dose  . 0.9 %  sodium chloride infusion  250 mL Intravenous Continuous Keith Merle, MD   Stopped at 07/03/14 1523    No Known Allergies   Assessment/Plan:  1. Hypertension - BP above goal <130/43mmHg in left arm and at home, right arm at goal <130/37mmHg. Will increase enalapril to 20mg  BID and continue amlodipine 10mg  daily and carvedilol 25mg  BID. Pt advised to monitor his BP in the next few weeks. Will f/u in HTN clinic in 3 weeks for BP check and BMET.   Keith Flores, PharmD, BCACP, [facility] [address], [place], [place] [address] Phone: [phone]; Fax: [phone] 07/30/2017 8:43 AM

## 2017-08-19 ENCOUNTER — Other Ambulatory Visit: Payer: Medicaid Other

## 2017-08-28 ENCOUNTER — Other Ambulatory Visit: Payer: Medicaid Other

## 2017-09-14 ENCOUNTER — Inpatient Hospital Stay: Payer: Medicaid Other | Attending: Hematology

## 2017-09-14 ENCOUNTER — Inpatient Hospital Stay: Payer: Medicaid Other

## 2017-09-14 VITALS — BP 135/79 | HR 76 | Temp 98.0°F | Resp 18

## 2017-09-14 DIAGNOSIS — D751 Secondary polycythemia: Secondary | ICD-10-CM

## 2017-09-14 LAB — CBC WITH DIFFERENTIAL (CANCER CENTER ONLY)
BASOS ABS: 0.1 10*3/uL (ref 0.0–0.1)
Basophils Relative: 1 %
EOS PCT: 7 %
Eosinophils Absolute: 0.4 10*3/uL (ref 0.0–0.5)
HCT: 49.2 % (ref 38.4–49.9)
Hemoglobin: 15.8 g/dL (ref 13.0–17.1)
LYMPHS PCT: 29 %
Lymphs Abs: 1.7 10*3/uL (ref 0.9–3.3)
MCH: 24.1 pg — ABNORMAL LOW (ref 27.2–33.4)
MCHC: 32 g/dL (ref 32.0–36.0)
MCV: 75.2 fL — ABNORMAL LOW (ref 79.3–98.0)
Monocytes Absolute: 0.4 10*3/uL (ref 0.1–0.9)
Monocytes Relative: 6 %
Neutro Abs: 3.4 10*3/uL (ref 1.5–6.5)
Neutrophils Relative %: 57 %
PLATELETS: 225 10*3/uL (ref 140–400)
RBC: 6.54 MIL/uL — AB (ref 4.20–5.82)
RDW: 17.7 % — ABNORMAL HIGH (ref 11.0–14.6)
WBC: 5.9 10*3/uL (ref 4.0–10.3)

## 2017-09-14 NOTE — Progress Notes (Signed)
Pt to be out of the country for a number of months. Dr. Burr Medico aware of trip. Pt given copy of last OV note and lab work to share with doctor in [place] to continue treatments while abroad. Appt for 10/15/17 cancelled per pt.

## 2017-09-14 NOTE — Patient Instructions (Signed)

## 2017-09-15 ENCOUNTER — Other Ambulatory Visit: Payer: Medicaid Other

## 2017-09-16 ENCOUNTER — Ambulatory Visit: Payer: Medicaid Other | Admitting: Hematology

## 2017-09-16 ENCOUNTER — Other Ambulatory Visit: Payer: Medicaid Other

## 2017-10-15 ENCOUNTER — Ambulatory Visit: Payer: Medicaid Other | Admitting: Hematology

## 2017-10-15 ENCOUNTER — Other Ambulatory Visit: Payer: Medicaid Other

## 2021-04-17 ENCOUNTER — Encounter: Payer: Self-pay | Admitting: Hematology

## 2021-04-24 ENCOUNTER — Ambulatory Visit: Payer: Medicaid Other | Admitting: Cardiology

## 2021-05-17 ENCOUNTER — Telehealth: Payer: Self-pay | Admitting: Hematology

## 2021-05-17 NOTE — Telephone Encounter (Signed)
.  Called patient to schedule appointment per 4/28 inbasket, patient is aware of date and time.   ?

## 2021-05-29 NOTE — Progress Notes (Signed)
[facility] ?OFFICE PROGRESS NOTE ? ?Elwyn Reach, MD ?[address] [place] ?[place] [address] ? ?DIAGNOSIS: Polycythemia, likely secondary secondary to smoking and obstructive sleep apnea. ? ?PRIOR THERAPY: None ? ?CURRENT THERAPY: Phlebotomy to maintain HCT less than 45%. Changed cut off to 47% on 05/18/17.  ? ?INTERVAL HISTORY: ?Keith Flores 61 y.o. male returns to the clinic today for a follow up visit accompanied by the interpretor.  The patient was last seen in clinic on 05/18/2017.  The patient was last seen by Dr. Burr Medico.  The patient's followed for polycythemia, thought to be secondary to smoking. He presently smokes 5-6 cigarettes per day. He also was told in the past he may have OSA; however, he does not wear a CPAP. The patient has been in [place] for the last several years.  ? ?He undergoes phlebotomies approximately once every 2 months and agent.  He is still taking Plavix and aspirin.  He has a history of open heart surgery for which she sees cardiology.  He is requesting a note outlining his medical conditions which he needs for SSI.  He estimates his last phlebotomy was approximately 2 months ago.  He is leaving again for a chip in less than 1 month. ? ?Since last being seen, the patient reports he is feeling well.  He denies any pruritus after a shower or bathing.  Denies any burning pain in his feet but sometimes has neuropathy in his toes.  Of note the patient has vitamin B12 deficiency and takes B12 supplements and also is being treated for diabetes with Amaryl and metformin..  Denies any cyanosis or discoloration.  Denies any chest pain or abdominal pain set for intermittent abdominal discomfort secondary to constipation.  He takes laxatives if needed.  Denies any myalgias and weakness. Denies any significant headache but may occasionally get a headache every once in a while.  Denies any history of DVT, CVA, PE, or MI.  He did have a CABG several years ago.  He takes  nitro if needed for angina.  Denies recent steroid or hormone use. ? ? ? ?MEDICAL HISTORY: ?Past Medical History:  ?Diagnosis Date  ? Asthma   ? Chest pain   ? 01/2011 in setting of HTN urgency: Chest CT demonstrated no pulmonary embolus, postoperative appearance following CABG and small left pleural effusion and dependent atelectasis in the left lung  ? Coronary artery disease   ? CABG 12/12 (LIMA-LAD, SVG-DX, SVG-OM, SVG-PDA) c/b post op AFib Rx with amio Rx. s/p successful PCI of the right coronary artery utilizing overlapping drug-eluting stents in the mid and distal vessel (done after abnormal nuc)  ? Diabetes mellitus   ? Elevated hemoglobin ([facility])   ? Hyperlipidemia   ? Hypertension   ? ? ?ALLERGIES:  has No Known Allergies. ? ?MEDICATIONS:  ?Current Outpatient Medications  ?Medication Sig Dispense Refill  ? amLODipine (NORVASC) 10 MG tablet TAKE 1 TABLET BY MOUTH ONCE DAILY 30 tablet 11  ? aspirin 81 MG tablet Take 1 tablet (81 mg total) by mouth daily. 30 tablet 3  ? clopidogrel (PLAVIX) 75 MG tablet Take 1 tablet (75 mg total) by mouth daily. 90 tablet 3  ? enalapril (VASOTEC) 20 MG tablet Take 1 tablet (20 mg total) by mouth 2 (two) times daily. 180 tablet 3  ? glimepiride (AMARYL) 4 MG tablet Take 4 mg by mouth daily with breakfast.    ? hydrochlorothiazide (HYDRODIURIL) 12.5 MG tablet Take 12.5 mg by  mouth daily.    ? metFORMIN (GLUCOPHAGE) 1000 MG tablet Take 1,000 mg by mouth 2 (two) times daily with a meal.    ? multivitamin-iron-minerals-folic acid (CENTRUM) chewable tablet Chew 1 tablet by mouth daily with breakfast.    ? traMADol (ULTRAM) 50 MG tablet Take 1 tablet (50 mg total) by mouth every 6 (six) hours as needed. 10 tablet 0  ? carvedilol (COREG) 25 MG tablet Take 1 tablet (25 mg total) by mouth 2 (two) times daily with a meal. 180 tablet 3  ? nitroGLYCERIN (NITROSTAT) 0.4 MG SL tablet Place 1 tablet (0.4 mg total) under the tongue every 5 (five) minutes as needed. For chest pain 25 tablet 3  ?  pravastatin (PRAVACHOL) 40 MG tablet Take 1 tablet (40 mg total) by mouth every evening. 90 tablet 3  ? sildenafil (REVATIO) 20 MG tablet TAKE 2-5 TABLETS BY MOUTH AS NEEDED PRIOR  TO  SEXUAL  ACTIVITY (Patient not taking: No sig reported) 50 tablet 1  ? ?No current facility-administered medications for this visit.  ? ?Facility-Administered Medications Ordered in Other Visits  ?Medication Dose Route Frequency Provider Last Rate Last Admin  ? 0.9 %  sodium chloride infusion  250 mL Intravenous Continuous Truitt Merle, MD   Stopped at 07/03/14 1523  ? ? ?SURGICAL HISTORY:  ?Past Surgical History:  ?Procedure Laterality Date  ? CARDIAC CATHETERIZATION    ? CORONARY ARTERY BYPASS GRAFT  12/30/2010  ? Procedure: CORONARY ARTERY BYPASS GRAFTING (CABG);  Surgeon: Grace Isaac, MD;  Location: [facility];  Service: Open Heart Surgery;  Laterality: N/A;  ? LEFT HEART CATHETERIZATION WITH CORONARY ANGIOGRAM N/A 01/12/2013  ? Procedure: LEFT HEART CATHETERIZATION WITH CORONARY ANGIOGRAM;  Surgeon: Blane Ohara, MD;  Location: [facility] CATH LAB;  Service: Cardiovascular;  Laterality: N/A;  ? LEFT HEART CATHETERIZATION WITH CORONARY/GRAFT ANGIOGRAM  03/07/2011  ? Procedure: LEFT HEART CATHETERIZATION WITH Beatrix Fetters;  Surgeon: Sherren Mocha, MD;  Location: [facility] CATH LAB;  Service: Cardiovascular;;  ? PERCUTANEOUS CORONARY STENT INTERVENTION (PCI-S)  03/07/2011  ? Procedure: PERCUTANEOUS CORONARY STENT INTERVENTION (PCI-S);  Surgeon: Sherren Mocha, MD;  Location: [facility] CATH LAB;  Service: Cardiovascular;;  ? ? ?REVIEW OF SYSTEMS:   ?Review of Systems  ?Constitutional: Negative for appetite change, chills, fatigue, fever and unexpected weight change.  ?HENT: Negative for mouth sores, nosebleeds, sore throat and trouble swallowing.   ?Eyes: Negative for eye problems and icterus.  ?Respiratory: Negative for cough, hemoptysis, shortness of breath and wheezing.   ?Cardiovascular: Negative for chest pain and leg swelling.   ?Gastrointestinal: Positive for occasional constipation. Negative for abdominal pain, diarrhea, nausea and vomiting.  ?Genitourinary: Negative for bladder incontinence, difficulty urinating, dysuria, frequency and hematuria.   ?Musculoskeletal: Negative for back pain, gait problem, neck pain and neck stiffness.  ?Skin: Negative for itching and rash.  ?Neurological: Positive for mild intermittent headaches.  Negative for dizziness, extremity weakness, gait problem, light-headedness and seizures.  ?Hematological: Negative for adenopathy. Does not bruise/bleed easily.  ?Psychiatric/Behavioral: Negative for confusion, depression and sleep disturbance. The patient is not nervous/anxious.   ? ? ?PHYSICAL EXAMINATION:  ?Blood pressure 136/75, pulse 75, temperature 97.9 ?F (36.6 ?C), resp. rate 20, weight 194 lb (88 kg), SpO2 98 %. ? ?ECOG PERFORMANCE STATUS: 1 ? ?Physical Exam  ?Constitutional: Oriented to person, place, and time and well-developed, well-nourished, and in no distress.   ?HENT:  ?Head: Normocephalic and atraumatic.  ?Mouth/Throat: Oropharynx is clear and moist. No oropharyngeal exudate.  ?Eyes: Conjunctivae are normal.  Right eye exhibits no discharge. Left eye exhibits no discharge. No scleral icterus.  ?Neck: Normal range of motion. Neck supple.  ?Cardiovascular: Normal rate, regular rhythm, normal heart sounds and intact distal pulses.   ?Pulmonary/Chest: Effort normal and breath sounds normal. No respiratory distress. No wheezes. No rales.  ?Abdominal: Soft. Bowel sounds are normal. Exhibits no distension and no mass. There is no tenderness.  ?Musculoskeletal: Normal range of motion. Exhibits no edema.  ?Lymphadenopathy:  ?  No cervical adenopathy.  ?Neurological: Alert and oriented to person, place, and time. Exhibits normal muscle tone. Gait normal. Coordination normal.  ?Skin: Skin is warm and dry. No rash noted. Not diaphoretic. No erythema. No pallor.  ?Psychiatric: Mood, memory and judgment  normal.  ?Vitals reviewed. ? ?LABORATORY DATA: ?Lab Results  ?Component Value Date  ? WBC 7.5 05/30/2021  ? HGB 16.8 05/30/2021  ? HCT 50.6 05/30/2021  ? MCV 79.2 (L) 05/30/2021  ? PLT 267 05/30/2021  ? ? ?  Chemistry

## 2021-05-30 ENCOUNTER — Ambulatory Visit: Payer: Commercial Managed Care - HMO | Admitting: Interventional Cardiology

## 2021-05-30 ENCOUNTER — Other Ambulatory Visit: Payer: Self-pay

## 2021-05-30 ENCOUNTER — Inpatient Hospital Stay: Payer: Commercial Managed Care - HMO

## 2021-05-30 ENCOUNTER — Encounter: Payer: Self-pay | Admitting: Hematology

## 2021-05-30 ENCOUNTER — Encounter: Payer: Self-pay | Admitting: Interventional Cardiology

## 2021-05-30 ENCOUNTER — Inpatient Hospital Stay: Payer: Commercial Managed Care - HMO | Attending: Physician Assistant

## 2021-05-30 ENCOUNTER — Telehealth: Payer: Self-pay | Admitting: *Deleted

## 2021-05-30 ENCOUNTER — Inpatient Hospital Stay (HOSPITAL_BASED_OUTPATIENT_CLINIC_OR_DEPARTMENT_OTHER): Payer: Commercial Managed Care - HMO | Admitting: Physician Assistant

## 2021-05-30 VITALS — BP 132/78 | HR 81 | Ht 66.0 in | Wt 191.0 lb

## 2021-05-30 VITALS — BP 136/75 | HR 75 | Temp 97.9°F | Resp 20 | Wt 194.0 lb

## 2021-05-30 VITALS — BP 136/82 | HR 75 | Resp 18

## 2021-05-30 DIAGNOSIS — Z7982 Long term (current) use of aspirin: Secondary | ICD-10-CM | POA: Insufficient documentation

## 2021-05-30 DIAGNOSIS — Z720 Tobacco use: Secondary | ICD-10-CM

## 2021-05-30 DIAGNOSIS — Z951 Presence of aortocoronary bypass graft: Secondary | ICD-10-CM | POA: Insufficient documentation

## 2021-05-30 DIAGNOSIS — Z7902 Long term (current) use of antithrombotics/antiplatelets: Secondary | ICD-10-CM | POA: Insufficient documentation

## 2021-05-30 DIAGNOSIS — E038 Other specified hypothyroidism: Secondary | ICD-10-CM | POA: Insufficient documentation

## 2021-05-30 DIAGNOSIS — I25118 Atherosclerotic heart disease of native coronary artery with other forms of angina pectoris: Secondary | ICD-10-CM | POA: Diagnosis not present

## 2021-05-30 DIAGNOSIS — I10 Essential (primary) hypertension: Secondary | ICD-10-CM | POA: Diagnosis not present

## 2021-05-30 DIAGNOSIS — D751 Secondary polycythemia: Secondary | ICD-10-CM

## 2021-05-30 DIAGNOSIS — F17210 Nicotine dependence, cigarettes, uncomplicated: Secondary | ICD-10-CM | POA: Diagnosis not present

## 2021-05-30 DIAGNOSIS — G4733 Obstructive sleep apnea (adult) (pediatric): Secondary | ICD-10-CM | POA: Insufficient documentation

## 2021-05-30 DIAGNOSIS — E782 Mixed hyperlipidemia: Secondary | ICD-10-CM | POA: Diagnosis not present

## 2021-05-30 LAB — CBC WITH DIFFERENTIAL (CANCER CENTER ONLY)
Abs Immature Granulocytes: 0.02 10*3/uL (ref 0.00–0.07)
Basophils Absolute: 0.1 10*3/uL (ref 0.0–0.1)
Basophils Relative: 1 %
Eosinophils Absolute: 0.6 10*3/uL — ABNORMAL HIGH (ref 0.0–0.5)
Eosinophils Relative: 8 %
HCT: 50.6 % (ref 39.0–52.0)
Hemoglobin: 16.8 g/dL (ref 13.0–17.0)
Immature Granulocytes: 0 %
Lymphocytes Relative: 24 %
Lymphs Abs: 1.8 10*3/uL (ref 0.7–4.0)
MCH: 26.3 pg (ref 26.0–34.0)
MCHC: 33.2 g/dL (ref 30.0–36.0)
MCV: 79.2 fL — ABNORMAL LOW (ref 80.0–100.0)
Monocytes Absolute: 0.5 10*3/uL (ref 0.1–1.0)
Monocytes Relative: 7 %
Neutro Abs: 4.5 10*3/uL (ref 1.7–7.7)
Neutrophils Relative %: 60 %
Platelet Count: 267 10*3/uL (ref 150–400)
RBC: 6.39 MIL/uL — ABNORMAL HIGH (ref 4.22–5.81)
RDW: 18 % — ABNORMAL HIGH (ref 11.5–15.5)
WBC Count: 7.5 10*3/uL (ref 4.0–10.5)
nRBC: 0 % (ref 0.0–0.2)

## 2021-05-30 MED ORDER — ROSUVASTATIN CALCIUM 40 MG PO TABS: 40.0000 mg | ORAL_TABLET | Freq: Every day | ORAL | 3 refills | Status: DC

## 2021-05-30 NOTE — Patient Instructions (Signed)

## 2021-05-30 NOTE — Telephone Encounter (Signed)
Patient notified via interpreter [phone]) that office note would be at front desk for him to pick up tomorrow. ?

## 2021-05-30 NOTE — Telephone Encounter (Signed)
Patient saw Dr Irish Lack today and needs information about his condition to submit to [facility].  Patient feels office note from today would work for this.  I told him we would contact him when office note from today was complete. Patient does not use the my chart patient portal.  ?

## 2021-05-30 NOTE — Telephone Encounter (Signed)
OV note placed up front. ?

## 2021-05-30 NOTE — Patient Instructions (Signed)
Medication Instructions:  ?Your physician has recommended you make the following change in your medication: Stop pravastatin ?Start Rosuvastatin 40 mg by mouth daily ? ?*If you need a refill on your cardiac medications before your next appointment, please call your pharmacy* ? ? ?Lab Work: ?Have lab work done in 3 months--CMET and Lipid profile.  You have a prescription for this.  ?If you have labs (blood work) drawn today and your tests are completely normal, you will receive your results only by: ?MyChart Message (if you have MyChart) OR ?A paper copy in the mail ?If you have any lab test that is abnormal or we need to change your treatment, we will call you to review the results. ? ? ?Testing/Procedures: ?none ? ? ?Follow-Up: ?At [facility], you and your health needs are our priority.  As part of our continuing mission to provide you with exceptional heart care, we have created designated Provider Care Teams.  These Care Teams include your primary Cardiologist (physician) and Advanced Practice Providers (APPs -  Physician Assistants and Nurse Practitioners) who all work together to provide you with the care you need, when you need it. ? ?We recommend signing up for the patient portal called "MyChart".  Sign up information is provided on this After Visit Summary.  MyChart is used to connect with patients for Virtual Visits (Telemedicine).  Patients are able to view lab/test results, encounter notes, upcoming appointments, etc.  Non-urgent messages can be sent to your provider as well.   ?To learn more about what you can do with MyChart, go to [email or web address].   ? ?Your next appointment:   ?12 month(s) ? ?The format for your next appointment:   ?In Person ? ?Provider:   ?Larae Grooms, MD   ? ? ?Other Instructions ?  ? ?Important Information About Sugar ? ? ? ? ? ? ?

## 2021-05-30 NOTE — Progress Notes (Signed)
Patient presents for therapeutic phlebotomy per MD orders.  ?Procedure started at 1236 using 16G phlebotomy kit to LAC. Ended at 1241 with total 524g removed. IV needle removed intact and patient had no complaints. Patient given food and drink and was observed for 20 minutes after phlebotomy as he did not want to wait the full 30. Vitals stable and patient in no distress upon leaving.  ?

## 2021-05-30 NOTE — Progress Notes (Signed)
?  ?Cardiology Office Note ? ? ?Date:  05/30/2021  ? ?ID:  Keith Flores, DOB 14-Jul-1960, MRN [id number] ? ?PCP:  Elwyn Reach, MD  ? ? ?No chief complaint on file. ? ?CAD ? ?Wt Readings from Last 3 Encounters:  ?05/30/21 191 lb (86.6 kg)  ?05/30/21 194 lb (88 kg)  ?05/18/17 204 lb 12.8 oz (92.9 kg)  ?  ? ?  ?History of Present Illness: ?Keith Flores is a 61 y.o. male who is being seen today for the evaluation of CAD at the request of Elwyn Reach, MD. ? ?He saw Dr. Burt Knack in the past and has coronary artery disease status post CABG in 2012. The patient had early graft closure involving the vein graft to right coronary artery and he underwent extensive stenting of the native RCA. He's followed for hypertension, Type II DM, and hyperlipidemia as well. ? ?He was lost to f/u.  He can have some chest tightness with stress.   ? ?He walks several times a week.  No sx with walking 2 miles.  Can have some tightness in his chest if he walks further.  No recent NTG.  No symptoms like prior to CABG.  Rare NTG use a few months ago.  ? ?10/2020- went to [place] and had ECHO showing EF 55%, negative stress test. ? ?Of late, Denies :  Dizziness. Leg edema.  Orthopnea. Palpitations. Paroxysmal nocturnal dyspnea. Shortness of breath. Syncope.   ? ? ?Past Medical History:  ?Diagnosis Date  ? Asthma   ? Chest pain   ? 01/2011 in setting of HTN urgency: Chest CT demonstrated no pulmonary embolus, postoperative appearance following CABG and small left pleural effusion and dependent atelectasis in the left lung  ? Coronary artery disease   ? CABG 12/12 (LIMA-LAD, SVG-DX, SVG-OM, SVG-PDA) c/b post op AFib Rx with amio Rx. s/p successful PCI of the right coronary artery utilizing overlapping drug-eluting stents in the mid and distal vessel (done after abnormal nuc)  ? Diabetes mellitus   ? Elevated hemoglobin (HCC)   ? Hyperlipidemia   ? Hypertension   ? ? ?Past Surgical History:  ?Procedure Laterality Date  ? CARDIAC  CATHETERIZATION    ? CORONARY ARTERY BYPASS GRAFT  12/30/2010  ? Procedure: CORONARY ARTERY BYPASS GRAFTING (CABG);  Surgeon: Grace Isaac, MD;  Location: [place];  Service: Open Heart Surgery;  Laterality: N/A;  ? LEFT HEART CATHETERIZATION WITH CORONARY ANGIOGRAM N/A 01/12/2013  ? Procedure: LEFT HEART CATHETERIZATION WITH CORONARY ANGIOGRAM;  Surgeon: Blane Ohara, MD;  Location: [facility] CATH LAB;  Service: Cardiovascular;  Laterality: N/A;  ? LEFT HEART CATHETERIZATION WITH CORONARY/GRAFT ANGIOGRAM  03/07/2011  ? Procedure: LEFT HEART CATHETERIZATION WITH Beatrix Fetters;  Surgeon: Sherren Mocha, MD;  Location: [facility] CATH LAB;  Service: Cardiovascular;;  ? PERCUTANEOUS CORONARY STENT INTERVENTION (PCI-S)  03/07/2011  ? Procedure: PERCUTANEOUS CORONARY STENT INTERVENTION (PCI-S);  Surgeon: Sherren Mocha, MD;  Location: [facility] CATH LAB;  Service: Cardiovascular;;  ? ? ? ?Current Outpatient Medications  ?Medication Sig Dispense Refill  ? amLODipine (NORVASC) 10 MG tablet TAKE 1 TABLET BY MOUTH ONCE DAILY 30 tablet 11  ? aspirin 81 MG tablet Take 1 tablet (81 mg total) by mouth daily. 30 tablet 3  ? carvedilol (COREG) 25 MG tablet Take 1 tablet (25 mg total) by mouth 2 (two) times daily with a meal. 180 tablet 3  ? clopidogrel (PLAVIX) 75 MG tablet Take 1 tablet (75 mg total) by mouth daily. 90 tablet  3  ? enalapril (VASOTEC) 20 MG tablet Take 1 tablet (20 mg total) by mouth 2 (two) times daily. 180 tablet 3  ? glimepiride (AMARYL) 4 MG tablet Take 4 mg by mouth daily with breakfast.    ? hydrochlorothiazide (HYDRODIURIL) 12.5 MG tablet Take 12.5 mg by mouth daily.    ? metFORMIN (GLUCOPHAGE) 1000 MG tablet Take 1,000 mg by mouth 2 (two) times daily with a meal.    ? multivitamin-iron-minerals-folic acid (CENTRUM) chewable tablet Chew 1 tablet by mouth daily with breakfast.    ? nitroGLYCERIN (NITROSTAT) 0.4 MG SL tablet Place 1 tablet (0.4 mg total) under the tongue every 5 (five) minutes as needed. For chest  pain 25 tablet 3  ? rosuvastatin (CRESTOR) 40 MG tablet Take 1 tablet (40 mg total) by mouth daily. 90 tablet 3  ? sildenafil (REVATIO) 20 MG tablet TAKE 2-5 TABLETS BY MOUTH AS NEEDED PRIOR  TO  SEXUAL  ACTIVITY 50 tablet 1  ? traMADol (ULTRAM) 50 MG tablet Take 1 tablet (50 mg total) by mouth every 6 (six) hours as needed. 10 tablet 0  ? ?No current facility-administered medications for this visit.  ? ?Facility-Administered Medications Ordered in Other Visits  ?Medication Dose Route Frequency Provider Last Rate Last Admin  ? 0.9 %  sodium chloride infusion  250 mL Intravenous Continuous Truitt Merle, MD   Stopped at 07/03/14 1523  ? ? ?Allergies:   Patient has no known allergies.  ? ? ?Social History:  The patient  reports that he has been smoking cigarettes. He has never used smokeless tobacco. He reports that he does not drink alcohol and does not use drugs.  ? ?Family History:  The patient's family history includes Diabetes Mellitus II in his father and mother; Hypertension in his father and mother.  ? ? ?ROS:  Please see the history of present illness.   Otherwise, review of systems are positive for left leg skin discoloration.   All other systems are reviewed and negative.  ? ? ?PHYSICAL EXAM: ?VS:  BP 132/78   Pulse 81   Ht '5\' 6"'$  (1.676 m)   Wt 191 lb (86.6 kg)   SpO2 95%   BMI 30.83 kg/m?  , BMI Body mass index is 30.83 kg/m?. ?GEN: Well nourished, well developed, in no acute distress ?HEENT: normal ?Neck: no JVD, carotid bruits, or masses ?Cardiac: RRR; no murmurs, rubs, or gallops,no edema  ?Respiratory:  clear to auscultation bilaterally, normal work of breathing ?GI: soft, nontender, nondistended, + BS ?MS: no deformity or atrophy; varicose veins on the right leg ?Skin: warm and dry, no rash; discolored left medial malleolus ?Neuro:  Strength and sensation are intact ?Psych: euthymic mood, full affect ? ? ?EKG:   ?The ekg ordered today demonstrates NSR, no ST changes ? ? ?Recent Labs: ?05/30/2021:  Hemoglobin 16.8; Platelet Count 267  ? ?Lipid Panel ?   ?Component Value Date/Time  ? CHOL 109 (L) 04/05/2015 0852  ? TRIG 81 04/05/2015 0852  ? HDL 32 (L) 04/05/2015 0852  ? CHOLHDL 3.4 04/05/2015 0852  ? VLDL 16 04/05/2015 0852  ? [place] 61 04/05/2015 0852  ? ?  ?Other studies Reviewed: ?Additional studies/ records that were reviewed today with results demonstrating: labs not available. ? ? ?ASSESSMENT AND PLAN: ? ?CAD: Has had care in [place].  EF 55% while in [place].  COntinue DAPT along with aggressive secondary prevention. He had a stress test in 10/22 that was normal in [place] per his report.    ?  HTN: The current medical regimen is effective;  continue present plan and medications.  Low-salt diet.  Avoid processed foods. ?Hyperlipidemia: Stop pravastatin.  Start rosuvastatin 40 mg daily. High potency statin indicated.  Check labs in 2-3 months.  He is not sure where he will be at that time.  He thinks he may be going to [place].  A prescription was given for him to get the labs after he makes a change in his medication.  If he is in the country, he will get them done here.  He is very comfortable getting health care in [place] as noted above. ?Tobacco abuse: I told him he needs to stop smoking.  I offered him a nicotine patch.  He declined. ?He is applying for disability and will need records sent to the Social Security administration. ? ? ?Current medicines are reviewed at length with the patient today.  The patient concerns regarding his medicines were addressed. ? ?The following changes have been made:  No change ? ?Labs/ tests ordered today include:  ?No orders of the defined types were placed in this encounter. ? ? ?Recommend 150 minutes/week of aerobic exercise ?Low fat, low carb, high fiber diet recommended ? ?Disposition:   FU in 1 year ? ? ?Signed, ?Larae Grooms, MD  ?05/30/2021 3:36 PM    ?[facility] ?[address], [place], [place]  [address] ?Phone: [phone]; Fax: [phone]  ? ?

## 2022-03-27 ENCOUNTER — Other Ambulatory Visit: Payer: Self-pay

## 2022-03-27 ENCOUNTER — Inpatient Hospital Stay: Payer: BLUE CROSS/BLUE SHIELD | Attending: Hematology

## 2022-03-27 ENCOUNTER — Encounter: Payer: Self-pay | Admitting: Hematology

## 2022-03-27 DIAGNOSIS — F17210 Nicotine dependence, cigarettes, uncomplicated: Secondary | ICD-10-CM | POA: Diagnosis not present

## 2022-03-27 DIAGNOSIS — G4730 Sleep apnea, unspecified: Secondary | ICD-10-CM | POA: Diagnosis not present

## 2022-03-27 DIAGNOSIS — D751 Secondary polycythemia: Secondary | ICD-10-CM

## 2022-03-27 DIAGNOSIS — R079 Chest pain, unspecified: Secondary | ICD-10-CM | POA: Insufficient documentation

## 2022-03-27 DIAGNOSIS — I2510 Atherosclerotic heart disease of native coronary artery without angina pectoris: Secondary | ICD-10-CM | POA: Insufficient documentation

## 2022-03-27 DIAGNOSIS — Z7982 Long term (current) use of aspirin: Secondary | ICD-10-CM | POA: Insufficient documentation

## 2022-03-27 LAB — CBC WITH DIFFERENTIAL/PLATELET
Abs Immature Granulocytes: 0.01 10*3/uL (ref 0.00–0.07)
Basophils Absolute: 0.1 10*3/uL (ref 0.0–0.1)
Basophils Relative: 1 %
Eosinophils Absolute: 0.4 10*3/uL (ref 0.0–0.5)
Eosinophils Relative: 5 %
HCT: 50.3 % (ref 39.0–52.0)
Hemoglobin: 16.3 g/dL (ref 13.0–17.0)
Immature Granulocytes: 0 %
Lymphocytes Relative: 30 %
Lymphs Abs: 2 10*3/uL (ref 0.7–4.0)
MCH: 25.5 pg — ABNORMAL LOW (ref 26.0–34.0)
MCHC: 32.4 g/dL (ref 30.0–36.0)
MCV: 78.7 fL — ABNORMAL LOW (ref 80.0–100.0)
Monocytes Absolute: 0.5 10*3/uL (ref 0.1–1.0)
Monocytes Relative: 8 %
Neutro Abs: 3.7 10*3/uL (ref 1.7–7.7)
Neutrophils Relative %: 56 %
Platelets: 223 10*3/uL (ref 150–400)
RBC: 6.39 MIL/uL — ABNORMAL HIGH (ref 4.22–5.81)
RDW: 16.8 % — ABNORMAL HIGH (ref 11.5–15.5)
WBC: 6.6 10*3/uL (ref 4.0–10.5)
nRBC: 0 % (ref 0.0–0.2)

## 2022-04-04 ENCOUNTER — Other Ambulatory Visit: Payer: Self-pay

## 2022-04-04 ENCOUNTER — Inpatient Hospital Stay: Payer: BLUE CROSS/BLUE SHIELD

## 2022-04-04 ENCOUNTER — Ambulatory Visit: Payer: Commercial Managed Care - HMO | Admitting: Hematology

## 2022-04-04 ENCOUNTER — Encounter: Payer: Self-pay | Admitting: Hematology

## 2022-04-04 ENCOUNTER — Inpatient Hospital Stay (HOSPITAL_BASED_OUTPATIENT_CLINIC_OR_DEPARTMENT_OTHER): Payer: BLUE CROSS/BLUE SHIELD | Admitting: Hematology

## 2022-04-04 VITALS — BP 139/70 | HR 60 | Temp 98.3°F | Resp 16 | Ht 66.0 in | Wt 193.1 lb

## 2022-04-04 VITALS — BP 142/73 | HR 61 | Resp 18

## 2022-04-04 DIAGNOSIS — D751 Secondary polycythemia: Secondary | ICD-10-CM

## 2022-04-04 NOTE — Progress Notes (Signed)
[facility]   [phone] Fax:([phone]   Clinic Follow up Note   Patient Care Team: Elwyn Reach, MD as PCP - General (Internal Medicine) Jettie Booze, MD as PCP - Cardiology (Cardiology) Sherren Mocha, MD as Consulting Physician (Cardiology)  Date of Service:  04/04/2022  CHIEF COMPLAINT: f/u of Polycythemia   CURRENT THERAPY:  Therapeutic Phlebotomy  ASSESSMENT:  Keith Flores is a 62 y.o. male with   Polycythemia, secondary Polycythemia, secondary to smoking  --He only needs one major WHO criteria for P vera, and does not meet any of the minor criteria. I reviewed his bone marrow biopsy with him, which does not support myeloproliferative disorder. JAK2 mutation was negative, his erythropoietin level was normal.  -I think his polycythemia is probable secondary to his extensive smoking history. Although he has never been hypoxic. -He had sleep study in July 2016. He was told by his primary care physicians that he has sleep apnea but does not need CPAP.  -Given his prior extensive history of coronary artery disease, I recommend continue phlebotomy to keep his hematocrit less than 45%. I do not recommend Hydrea.  -He will continue daily baby aspirin (81 mg) daily.  -I suggested checking his BG and Liver/kidney function with His PCP twice yearly.  -He smokes 6-10 cigarettes a day. I again discussed that when he quits he will need less phlebotomies.  -He is clinically doing well, no recent episodes of thrombosis. -Lab reviewed, will proceed phlebotomy today, and again in 6 weeks before he is travel back to [place]. -He will call me again when he returns  Chest pain -He reports exertional chest pain when he walks, resolves by resting. -He is scheduled to see cardiology on April 23, I will send a message to see if they can see him sooner.  PLAN: -Lab reviewed, will proceed with phlebotomy today. -Lab and phlebotomy in about 6  weeks, before his trip back to [place].  He plans to continue lab and phlebotomy every 2 months in [place]. -I will send a message to his cardiologist, to see if they can see him sooner.   SUMMARY OF ONCOLOGIC HISTORY: Oncology History   No history exists.     INTERVAL HISTORY:  Keith Flores is here for a follow up of Polycythemia  He was last seen by PA-C Cassie on  05/30/2021 He presents to the clinic with his interpreter.  He is recently came back from [place], he did the lab and phlebotomy every 2 months in [place].  He is overall doing well, does report intermittent chest pain after exertion, such as walking for a mile.  No dyspnea, or other discomfort.  He does have appointment with cardiologist next month.   All other systems were reviewed with the patient and are negative.  MEDICAL HISTORY:  Past Medical History:  Diagnosis Date   Asthma    Chest pain    01/2011 in setting of HTN urgency: Chest CT demonstrated no pulmonary embolus, postoperative appearance following CABG and small left pleural effusion and dependent atelectasis in the left lung   Coronary artery disease    CABG 12/12 (LIMA-LAD, SVG-DX, SVG-OM, SVG-PDA) c/b post op AFib Rx with amio Rx. s/p successful PCI of the right coronary artery utilizing overlapping drug-eluting stents in the mid and distal vessel (done after abnormal nuc)   Diabetes mellitus    Elevated hemoglobin (HCC)    Hyperlipidemia    Hypertension     SURGICAL HISTORY:  Past Surgical History:  Procedure Laterality Date   CARDIAC CATHETERIZATION     CORONARY ARTERY BYPASS GRAFT  12/30/2010   Procedure: CORONARY ARTERY BYPASS GRAFTING (CABG);  Surgeon: Grace Isaac, MD;  Location: [place];  Service: Open Heart Surgery;  Laterality: N/A;   LEFT HEART CATHETERIZATION WITH CORONARY ANGIOGRAM N/A 01/12/2013   Procedure: LEFT HEART CATHETERIZATION WITH CORONARY ANGIOGRAM;  Surgeon: Blane Ohara, MD;  Location: [facility] CATH LAB;  Service:  Cardiovascular;  Laterality: N/A;   LEFT HEART CATHETERIZATION WITH CORONARY/GRAFT ANGIOGRAM  03/07/2011   Procedure: LEFT HEART CATHETERIZATION WITH Beatrix Fetters;  Surgeon: Sherren Mocha, MD;  Location: [facility] CATH LAB;  Service: Cardiovascular;;   PERCUTANEOUS CORONARY STENT INTERVENTION (PCI-S)  03/07/2011   Procedure: PERCUTANEOUS CORONARY STENT INTERVENTION (PCI-S);  Surgeon: Sherren Mocha, MD;  Location: [facility] CATH LAB;  Service: Cardiovascular;;    I have reviewed the social history and family history with the patient and they are unchanged from previous note.  ALLERGIES:  has No Known Allergies.  MEDICATIONS:  Current Outpatient Medications  Medication Sig Dispense Refill   amLODipine (NORVASC) 10 MG tablet TAKE 1 TABLET BY MOUTH ONCE DAILY 30 tablet 11   carvedilol (COREG) 25 MG tablet Take 1 tablet (25 mg total) by mouth 2 (two) times daily with a meal. 180 tablet 3   clopidogrel (PLAVIX) 75 MG tablet Take 1 tablet (75 mg total) by mouth daily. 90 tablet 3   enalapril (VASOTEC) 20 MG tablet Take 1 tablet (20 mg total) by mouth 2 (two) times daily. 180 tablet 3   glimepiride (AMARYL) 4 MG tablet Take 4 mg by mouth daily with breakfast.     hydrochlorothiazide (HYDRODIURIL) 12.5 MG tablet Take 12.5 mg by mouth daily.     metFORMIN (GLUCOPHAGE) 1000 MG tablet Take 1,000 mg by mouth 2 (two) times daily with a meal.     nitroGLYCERIN (NITROSTAT) 0.4 MG SL tablet Place 1 tablet (0.4 mg total) under the tongue every 5 (five) minutes as needed. For chest pain 25 tablet 3   rosuvastatin (CRESTOR) 40 MG tablet Take 1 tablet (40 mg total) by mouth daily. 90 tablet 3   tamsulosin (FLOMAX) 0.4 MG CAPS capsule Take 0.4 mg by mouth daily. Patient report     traMADol (ULTRAM) 50 MG tablet Take 1 tablet (50 mg total) by mouth every 6 (six) hours as needed. 10 tablet 0   No current facility-administered medications for this visit.   Facility-Administered Medications Ordered in Other Visits   Medication Dose Route Frequency Provider Last Rate Last Admin   0.9 %  sodium chloride infusion  250 mL Intravenous Continuous Truitt Merle, MD   Stopped at 07/03/14 1523    PHYSICAL EXAMINATION: ECOG PERFORMANCE STATUS: 1 - Symptomatic but completely ambulatory  Vitals:   04/04/22 1247  BP: 139/70  Pulse: 60  Resp: 16  Temp: 98.3 F (36.8 C)  SpO2: 96%   Wt Readings from Last 3 Encounters:  04/04/22 193 lb 1.6 oz (87.6 kg)  05/30/21 191 lb (86.6 kg)  05/30/21 194 lb (88 kg)     GENERAL:alert, no distress and comfortable SKIN: skin color, texture, turgor are normal, no rashes or significant lesions EYES: normal, Conjunctiva are pink and non-injected, sclera clear NECK: supple, thyroid normal size, non-tender, without nodularity LYMPH:  no palpable lymphadenopathy in the cervical, axillary  LUNGS: clear to auscultation and percussion with normal breathing effort HEART: regular rate & rhythm and no murmurs and no lower extremity edema ABDOMEN:abdomen  soft, non-tender and normal bowel sounds Musculoskeletal:no cyanosis of digits and no clubbing  NEURO: alert & oriented x 3 with fluent speech, no focal motor/sensory deficits  LABORATORY DATA:  I have reviewed the data as listed    Latest Ref Rng & Units 03/27/2022   10:21 AM 05/30/2021   11:08 AM 09/14/2017   12:23 PM  CBC  WBC 4.0 - 10.5 K/uL 6.6  7.5  5.9   Hemoglobin 13.0 - 17.0 g/dL 16.3  16.8  15.8   Hematocrit 39.0 - 52.0 % 50.3  50.6  49.2   Platelets 150 - 400 K/uL 223  267  225         Latest Ref Rng & Units 07/23/2015    1:09 PM 04/05/2015    8:52 AM 03/21/2014    1:17 PM  CMP  Glucose 70 - 140 mg/dl 269  140  133   BUN 7.0 - 26.0 mg/dL 13.4  13  18.5   Creatinine 0.7 - 1.3 mg/dL 1.0  0.90  1.0   Sodium 136 - 145 mEq/L 137  138  140   Potassium 3.5 - 5.1 mEq/L 3.6  3.9  4.0   Chloride 98 - 110 mmol/L  104    CO2 22 - 29 mEq/L 26  24  25    Calcium 8.4 - 10.4 mg/dL 9.5  9.5  9.7   Total Protein 6.4 - 8.3 g/dL  7.5  7.1    Total Bilirubin 0.20 - 1.20 mg/dL 0.77  0.7    Alkaline Phos 40 - 150 U/L 81  73    AST 5 - 34 U/L 21  19    ALT 0 - 55 U/L 35  28        RADIOGRAPHIC STUDIES: I have personally reviewed the radiological images as listed and agreed with the findings in the report. No results found.    No orders of the defined types were placed in this encounter.  All questions were answered. The patient knows to call the clinic with any problems, questions or concerns. No barriers to learning was detected. The total time spent in the appointment was 20 minutes.     Truitt Merle, MD 04/04/2022   Felicity Coyer, CMA, am acting as scribe for Truitt Merle, MD.   I have reviewed the above documentation for accuracy and completeness, and I agree with the above.

## 2022-04-04 NOTE — Assessment & Plan Note (Signed)
Polycythemia, secondary to smoking  --He only needs one major WHO criteria for P vera, and does not meet any of the minor criteria. I reviewed his bone marrow biopsy with him, which does not support myeloproliferative disorder. JAK2 mutation was negative, his erythropoietin level was normal.  -I think his polycythemia is probable secondary to his extensive smoking history. Although he has never been hypoxic. -He had sleep study in July 2016. He was told by his primary care physicians that he has sleep apnea but does not need CPAP.  -Given his prior extensive history of coronary artery disease, I recommend continue phlebotomy to keep his hematocrit less than 45%. I do not recommend Hydrea.  -He will continue daily baby aspirin (81 mg) daily.  -I suggested checking his BG and Liver/kidney function with His PCP twice yearly.  -He smokes 6-10 cigarettes a day. I again discussed that when he quits he will need less phlebotomies.

## 2022-04-04 NOTE — Progress Notes (Signed)
Keith Flores presents today for phlebotomy per MD orders. Phlebotomy procedure started at 1445 and ended at 1455. 514 grams removed via 18G Right AC Patient observed for 30 minutes after procedure without any incident.  IV needle removed intact. Pt refusing food and drink due to fasting. Patient tolerated procedure well.

## 2022-04-04 NOTE — Patient Instructions (Signed)

## 2022-04-08 ENCOUNTER — Telehealth: Payer: Self-pay

## 2022-04-08 NOTE — Telephone Encounter (Signed)
Spoke with patient to move his appointment up. Rescheduled to 04/25/2022 per Ambrose Pancoast, NP request.

## 2022-04-18 ENCOUNTER — Encounter: Payer: Self-pay | Admitting: Hematology

## 2022-04-24 NOTE — Progress Notes (Signed)
Office Visit    Patient Name: Keith JollyMedhat M Flores Date of Encounter: 04/24/2022  Primary Care Provider:  Rometta EmeryGarba, Mohammad L, MD Primary Cardiologist:  Lance MussJayadeep Varanasi, MD Primary Electrophysiologist: None  Chief Complaint    Keith Flores is a 62 y.o. male with PMH of CAD s/p CABG 2010 (LIMA-LAD, SVG-DX, SVG-OM, SVG-PDA) and PCI with DES x 2 to RCA 02/2011 due to early graft occlusion, HTN, HLD, DM type II, tobacco abuse who presents today for 1 year follow-up of CAD.  Past Medical History    Past Medical History:  Diagnosis Date   Asthma    Chest pain    01/2011 in setting of HTN urgency: Chest CT demonstrated no pulmonary embolus, postoperative appearance following CABG and small left pleural effusion and dependent atelectasis in the left lung   Coronary artery disease    CABG 12/12 (LIMA-LAD, SVG-DX, SVG-OM, SVG-PDA) c/b post op AFib Rx with amio Rx. s/p successful PCI of the right coronary artery utilizing overlapping drug-eluting stents in the mid and distal vessel (done after abnormal nuc)   Diabetes mellitus    Elevated hemoglobin (HCC)    Hyperlipidemia    Hypertension    Past Surgical History:  Procedure Laterality Date   CARDIAC CATHETERIZATION     CORONARY ARTERY BYPASS GRAFT  12/30/2010   Procedure: CORONARY ARTERY BYPASS GRAFTING (CABG);  Surgeon: Delight OvensEdward B Gerhardt, MD;  [facility];  Service: Open Heart Surgery;  Laterality: N/A;   LEFT HEART CATHETERIZATION WITH CORONARY ANGIOGRAM N/A 01/12/2013   Procedure: LEFT HEART CATHETERIZATION WITH CORONARY ANGIOGRAM;  Surgeon: Micheline ChapmanMichael D Cooper, MD;  [facility];  Service: Cardiovascular;  Laterality: N/A;   LEFT HEART CATHETERIZATION WITH CORONARY/GRAFT ANGIOGRAM  03/07/2011   Procedure: LEFT HEART CATHETERIZATION WITH Isabel CapriceORONARY/GRAFT ANGIOGRAM;  Surgeon: Tonny BollmanMichael Cooper, MD;  [facility];  Service: Cardiovascular;;   PERCUTANEOUS CORONARY STENT INTERVENTION (PCI-S)  03/07/2011   Procedure:  PERCUTANEOUS CORONARY STENT INTERVENTION (PCI-S);  Surgeon: Tonny BollmanMichael Cooper, MD;  [facility];  Service: Cardiovascular;;    Allergies  No Known Allergies  History of Present Illness    Keith Flores  is a 62 year old male with the above mention past medical history who presents today for 1 year follow-up of CAD.  He was seen initially in 2013 by Dr. Excell Seltzerooper for follow-up of CABG in 2010 with (LIMA-LAD, SVG-DX, SVG-OM, SVG-PDA).  He suffered recurrent chest pain in 2012 and underwent stress test that showed inferior ischemia and left heart cath was completed showing occlusion of RCA that was treated overlapping DES x 2.  He was seen in follow-up 11/2012 and endorsed some chest discomfort with emotional stress.  Stress test was repeated and showed abnormality with inferior infarct and peri-infarct ischemia.  He had repeat LHC on 12/2012 that showed continued patency of grafts and stable coronary anatomy.  He was last seen by Dr. Excell Seltzerooper in 03/2017 and was advised to continue his dual antiplatelet therapy and was encouraged to discontinue irbesartan and HCTZ.  He was lost to follow-up until being seen by Dr. Eldridge DaceVaranasi on 05/2021.  He reported doing well and at some heart care completed an EEG with stress test completed 10/2020 that was normal per his report.  He was advised to stop smoking and was offered nicotine patches but declined.  Keith Flores presents today for 1 year follow-up accompanied by an interpreter.  Since last being seen in the office patient reports that he has been  experiencing bouts of chest pain and shortness of breath with increased physical activity and emotional upset.  He describes the chest pain as burning sensation that travels from his shoulders bilaterally to his sternum.  He states that the pain is reproducible when he is walking for a prolonged period of time.  He has spent the last few months in [place] reports that during his stay he completed an echocardiogram  that was abnormal.  He is unable to obtain these records from [place]. He reports compliance with his current medications and denies any adverse reactions. His blood pressure today is 146/88 and HR was 75 bpm.  He continues to be followed by hematology for polycythemia.  He is planning to travel to [place] for an extended amount of time.  Patient denies chest pain, palpitations, dyspnea, PND, orthopnea, nausea, vomiting, dizziness, syncope, edema, weight gain, or early satiety.   Home Medications    Current Outpatient Medications  Medication Sig Dispense Refill   amLODipine (NORVASC) 10 MG tablet TAKE 1 TABLET BY MOUTH ONCE DAILY 30 tablet 11   carvedilol (COREG) 25 MG tablet Take 1 tablet (25 mg total) by mouth 2 (two) times daily with a meal. 180 tablet 3   clopidogrel (PLAVIX) 75 MG tablet Take 1 tablet (75 mg total) by mouth daily. 90 tablet 3   enalapril (VASOTEC) 20 MG tablet Take 1 tablet (20 mg total) by mouth 2 (two) times daily. 180 tablet 3   glimepiride (AMARYL) 4 MG tablet Take 4 mg by mouth daily with breakfast.     hydrochlorothiazide (HYDRODIURIL) 12.5 MG tablet Take 12.5 mg by mouth daily.     metFORMIN (GLUCOPHAGE) 1000 MG tablet Take 1,000 mg by mouth 2 (two) times daily with a meal.     nitroGLYCERIN (NITROSTAT) 0.4 MG SL tablet Place 1 tablet (0.4 mg total) under the tongue every 5 (five) minutes as needed. For chest pain 25 tablet 3   rosuvastatin (CRESTOR) 40 MG tablet Take 1 tablet (40 mg total) by mouth daily. 90 tablet 3   tamsulosin (FLOMAX) 0.4 MG CAPS capsule Take 0.4 mg by mouth daily. Patient report     traMADol (ULTRAM) 50 MG tablet Take 1 tablet (50 mg total) by mouth every 6 (six) hours as needed. 10 tablet 0   No current facility-administered medications for this visit.   Facility-Administered Medications Ordered in Other Visits  Medication Dose Route Frequency Provider Last Rate Last Admin   0.9 %  sodium chloride infusion  250 mL Intravenous Continuous Malachy MoodFeng,  Yan, MD   Stopped at 07/03/14 1523     Review of Systems  Please see the history of present illness.    (+) Chest pressure and pain (+) Shortness of breath  All other systems reviewed and are otherwise negative except as noted above.  Physical Exam    Wt Readings from Last 3 Encounters:  04/04/22 193 lb 1.6 oz (87.6 kg)  05/30/21 191 lb (86.6 kg)  05/30/21 194 lb (88 kg)   ZO:XWRUEVS:There were no vitals filed for this visit.,There is no height or weight on file to calculate BMI.  Constitutional:      Appearance: Healthy appearance. Not in distress.  Neck:     Vascular: JVD normal.  Pulmonary:     Effort: Pulmonary effort is normal.     Breath sounds: No wheezing. No rales. Diminished in the bases Cardiovascular:     Normal rate. Regular rhythm. Normal S1. Normal S2.      Murmurs:  There is no murmur.  Edema:    Peripheral edema absent.  Abdominal:     Palpations: Abdomen is soft non tender. There is no hepatomegaly.  Skin:    General: Skin is warm and dry.  Neurological:     General: No focal deficit present.     Mental Status: Alert and oriented to person, place and time.     Cranial Nerves: Cranial nerves are intact.  EKG/LABS/ Recent Cardiac Studies    ECG personally reviewed by me today -none completed today   Lab Results  Component Value Date   WBC 6.6 03/27/2022   HGB 16.3 03/27/2022   HCT 50.3 03/27/2022   MCV 78.7 (L) 03/27/2022   PLT 223 03/27/2022   Lab Results  Component Value Date   CREATININE 1.0 07/23/2015   BUN 13.4 07/23/2015   NA 137 07/23/2015   K 3.6 07/23/2015   CL 104 04/05/2015   CO2 26 07/23/2015   Lab Results  Component Value Date   ALT 35 07/23/2015   AST 21 07/23/2015   ALKPHOS 81 07/23/2015   BILITOT 0.77 07/23/2015   Lab Results  Component Value Date   CHOL 109 (L) 04/05/2015   HDL 32 (L) 04/05/2015   LDLCALC 61 04/05/2015   TRIG 81 04/05/2015   CHOLHDL 3.4 04/05/2015    Lab Results  Component Value Date   HGBA1C 5.7  (H) 02/18/2011    Cardiac Studies & Procedures                  Assessment & Plan    1.  Coronary artery disease: -s/p CABG in 2010 with subsequent DES x 2 to RCA in 2013 -Today patient reports increased chest pressure that occurs with emotional upset and increased physical activity. -Continue current GDMT with Plavix 75 mg, ASA 81 mg, carvedilol 25 mg twice daily -He completed his last ischemic evaluation by Lexiscan in 2013 -We will have him complete a Lexiscan Myoview and echocardiogram to evaluate for structural heart changes ischemia. -He was advised to contact our office if he experiences increased discomfort and was also given ED precautions for follow-up if pain is not relieved with as needed nitroglycerin.  2.  Essential hypertension: -Patient's blood pressure today is 146/88  -Continue carvedilol 25 mg twice daily, Norvasc 10 mg daily, HCTZ 12.5 mg daily  3.  Hyperlipidemia -Patient's last LDL cholesterol was completed in 2017. -Patient is currently fasting and we will obtain LFTs and lipids today -Continue Crestor 40 mg daily  4.  DM type II: -Patient's last hemoglobin A1c was completed recently and by his PCP. -Continue current diabetic regimen per PCP  5.  Tobacco abuse: -Today patient reports that he is quit smoking 3 weeks ago and plans to abstain from tobacco use. -He was congratulated and advised to continue to abstain from cigarette smoking.  Disposition: Follow-up with Lance Muss, MD   needed Shared Decision Making/Informed Consent The risks [chest pain, shortness of breath, cardiac arrhythmias, dizziness, blood pressure fluctuations, myocardial infarction, stroke/transient ischemic attack, nausea, vomiting, allergic reaction, radiation exposure, metallic taste sensation and life-threatening complications (estimated to be 1 in 10,000)], benefits (risk stratification, diagnosing coronary artery disease, treatment guidance) and alternatives of a nuclear  stress test were discussed in detail with Keith Flores and he agrees to proceed.   Medication Adjustments/Labs and Tests Ordered: Current medicines are reviewed at length with the patient today.  Concerns regarding medicines are outlined above.   Signed, Napoleon Form, Leodis Rains, NP  04/24/2022, 9:13 AM [facility]  Note:  This document was prepared using Dragon voice recognition software and may include unintentional dictation errors.

## 2022-04-25 ENCOUNTER — Encounter: Payer: Self-pay | Admitting: *Deleted

## 2022-04-25 ENCOUNTER — Ambulatory Visit: Payer: BLUE CROSS/BLUE SHIELD | Attending: Nurse Practitioner | Admitting: Nurse Practitioner

## 2022-04-25 ENCOUNTER — Encounter: Payer: Self-pay | Admitting: Nurse Practitioner

## 2022-04-25 VITALS — BP 146/88 | HR 75 | Ht 66.0 in | Wt 182.8 lb

## 2022-04-25 DIAGNOSIS — I25118 Atherosclerotic heart disease of native coronary artery with other forms of angina pectoris: Secondary | ICD-10-CM | POA: Diagnosis not present

## 2022-04-25 DIAGNOSIS — I2089 Other forms of angina pectoris: Secondary | ICD-10-CM

## 2022-04-25 DIAGNOSIS — Z720 Tobacco use: Secondary | ICD-10-CM

## 2022-04-25 DIAGNOSIS — E782 Mixed hyperlipidemia: Secondary | ICD-10-CM

## 2022-04-25 DIAGNOSIS — I10 Essential (primary) hypertension: Secondary | ICD-10-CM | POA: Diagnosis not present

## 2022-04-25 DIAGNOSIS — I2510 Atherosclerotic heart disease of native coronary artery without angina pectoris: Secondary | ICD-10-CM

## 2022-04-25 DIAGNOSIS — E119 Type 2 diabetes mellitus without complications: Secondary | ICD-10-CM | POA: Diagnosis not present

## 2022-04-25 NOTE — Patient Instructions (Signed)
Medication Instructions:  Your physician recommends that you continue on your current medications as directed. Please refer to the Current Medication list given to you today.  *If you need a refill on your cardiac medications before your next appointment, please call your pharmacy*   Lab Work: Fasting lipids and lft's--TODAY If you have labs (blood work) drawn today and your tests are completely normal, you will receive your results only by: MyChart Message (if you have MyChart) OR A paper copy in the mail If you have any lab test that is abnormal or we need to change your treatment, we will call you to review the results.   Testing/Procedures: Your physician has requested that you have an echocardiogram. Echocardiography is a painless test that uses sound waves to create images of your heart. It provides your doctor with information about the size and shape of your heart and how well your heart's chambers and valves are working. This procedure takes approximately one hour. There are no restrictions for this procedure. Please do NOT wear cologne, perfume, aftershave, or lotions (deodorant is allowed). Please arrive 15 minutes prior to your appointment time.   Your physician has requested that you have a lexiscan myoview. For further information please visit [email or web address] Please follow instruction sheet, as given.   Follow-Up: At [facility], you and your health needs are our priority.  As part of our continuing mission to provide you with exceptional heart care, we have created designated Provider Care Teams.  These Care Teams include your primary Cardiologist (physician) and Advanced Practice Providers (APPs -  Physician Assistants and Nurse Practitioners) who all work together to provide you with the care you need, when you need it.  We recommend signing up for the patient portal called "MyChart".  Sign up information is provided on this After Visit Summary.  MyChart is used  to connect with patients for Virtual Visits (Telemedicine).  Patients are able to view lab/test results, encounter notes, upcoming appointments, etc.  Non-urgent messages can be sent to your provider as well.   To learn more about what you can do with MyChart, go to [email or web address].    Your next appointment:   As needed

## 2022-04-26 LAB — LIPID PANEL
Chol/HDL Ratio: 2.7 ratio (ref 0.0–5.0)
Cholesterol, Total: 98 mg/dL — ABNORMAL LOW (ref 100–199)
HDL: 36 mg/dL — ABNORMAL LOW (ref >39)
LDL Chol Calc (NIH): 47 mg/dL (ref 0–99)
Triglycerides: 73 mg/dL (ref 0–149)
VLDL Cholesterol Cal: 15 mg/dL (ref 5–40)

## 2022-04-26 LAB — HEPATIC FUNCTION PANEL
ALT: 18 IU/L (ref 0–44)
AST: 16 IU/L (ref 0–40)
Albumin: 4.8 g/dL (ref 3.9–4.9)
Alkaline Phosphatase: 80 IU/L (ref 44–121)
Bilirubin Total: 1 mg/dL (ref 0.0–1.2)
Bilirubin, Direct: 0.31 mg/dL (ref 0.00–0.40)
Total Protein: 7.5 g/dL (ref 6.0–8.5)

## 2022-04-30 ENCOUNTER — Ambulatory Visit (HOSPITAL_COMMUNITY): Payer: BLUE CROSS/BLUE SHIELD | Attending: Nurse Practitioner

## 2022-04-30 ENCOUNTER — Telehealth (HOSPITAL_COMMUNITY): Payer: Self-pay | Admitting: *Deleted

## 2022-04-30 DIAGNOSIS — E119 Type 2 diabetes mellitus without complications: Secondary | ICD-10-CM | POA: Insufficient documentation

## 2022-04-30 DIAGNOSIS — I25118 Atherosclerotic heart disease of native coronary artery with other forms of angina pectoris: Secondary | ICD-10-CM | POA: Diagnosis not present

## 2022-04-30 DIAGNOSIS — E782 Mixed hyperlipidemia: Secondary | ICD-10-CM | POA: Diagnosis not present

## 2022-04-30 DIAGNOSIS — I10 Essential (primary) hypertension: Secondary | ICD-10-CM | POA: Insufficient documentation

## 2022-04-30 DIAGNOSIS — Z720 Tobacco use: Secondary | ICD-10-CM | POA: Insufficient documentation

## 2022-04-30 DIAGNOSIS — I2510 Atherosclerotic heart disease of native coronary artery without angina pectoris: Secondary | ICD-10-CM

## 2022-04-30 DIAGNOSIS — I2089 Other forms of angina pectoris: Secondary | ICD-10-CM | POA: Insufficient documentation

## 2022-04-30 LAB — ECHOCARDIOGRAM COMPLETE: S' Lateral: 2.8 cm

## 2022-04-30 MED ORDER — PERFLUTREN LIPID MICROSPHERE
1.0000 mL | INTRAVENOUS | Status: AC | PRN
  Administered 2022-04-30: 2 mL via INTRAVENOUS

## 2022-04-30 NOTE — Telephone Encounter (Signed)
Via interpreter # [phone]  Patient given detailed instructions per Myocardial Perfusion Study Information Sheet for the test on 05/01/2022 at 10:45. Patient notified to arrive 15 minutes early and that it is imperative to arrive on time for appointment to keep from having the test rescheduled.  If you need to cancel or reschedule your appointment, please call the office within 24 hours of your appointment. . Patient verbalized understanding.Keith Flores

## 2022-05-01 ENCOUNTER — Ambulatory Visit (HOSPITAL_BASED_OUTPATIENT_CLINIC_OR_DEPARTMENT_OTHER): Payer: BLUE CROSS/BLUE SHIELD

## 2022-05-01 DIAGNOSIS — I10 Essential (primary) hypertension: Secondary | ICD-10-CM | POA: Diagnosis present

## 2022-05-01 DIAGNOSIS — I2089 Other forms of angina pectoris: Secondary | ICD-10-CM | POA: Diagnosis present

## 2022-05-01 DIAGNOSIS — E782 Mixed hyperlipidemia: Secondary | ICD-10-CM | POA: Diagnosis present

## 2022-05-01 DIAGNOSIS — Z720 Tobacco use: Secondary | ICD-10-CM | POA: Diagnosis present

## 2022-05-01 DIAGNOSIS — E119 Type 2 diabetes mellitus without complications: Secondary | ICD-10-CM | POA: Diagnosis present

## 2022-05-01 DIAGNOSIS — I2510 Atherosclerotic heart disease of native coronary artery without angina pectoris: Secondary | ICD-10-CM | POA: Diagnosis present

## 2022-05-01 LAB — MYOCARDIAL PERFUSION IMAGING
LV dias vol: 94 mL (ref 62–150)
LV sys vol: 40 mL
Nuc Stress EF: 58 %
Peak HR: 96 {beats}/min
Rest HR: 72 {beats}/min
Rest Nuclear Isotope Dose: 8.2 mCi
SDS: 8
SRS: 8
SSS: 18
ST Depression (mm): 0 mm
Stress Nuclear Isotope Dose: 27 mCi
TID: 1.13

## 2022-05-01 MED ORDER — TECHNETIUM TC 99M TETROFOSMIN IV KIT
27.0000 | PACK | Freq: Once | INTRAVENOUS | Status: AC | PRN
  Administered 2022-05-01: 27 via INTRAVENOUS

## 2022-05-01 MED ORDER — TECHNETIUM TC 99M SESTAMIBI GENERIC - CARDIOLITE
8.2000 | Freq: Once | INTRAVENOUS | Status: AC | PRN
  Administered 2022-05-01: 8.2 via INTRAVENOUS

## 2022-05-01 MED ORDER — REGADENOSON 0.4 MG/5ML IV SOLN
0.4000 mg | Freq: Once | INTRAVENOUS | Status: AC
  Administered 2022-05-01: 0.4 mg via INTRAVENOUS

## 2022-05-05 NOTE — H&P (View-Only) (Signed)
Cardiology Office Note   Date:  05/06/2022   ID:  Keith Flores, DOB [birth year], MRN [id number]  PCP:  Rometta Emery, MD    No chief complaint on file.  CAD  Wt Readings from Last 3 Encounters:  05/06/22 181 lb 9.6 oz (82.4 kg)  05/01/22 182 lb (82.6 kg)  04/25/22 182 lb 12.8 oz (82.9 kg)       History of Present Illness: Keith Flores is a 62 y.o. male  saw Dr. Excell Seltzer in the past and has coronary artery disease status post CABG in 2012. The patient had early graft closure involving the vein graft to right coronary artery and he underwent extensive stenting of the native RCA. He's followed for hypertension, Type II DM, and hyperlipidemia as well.  Last cath in 2014 showed: " Severe native three-vessel coronary artery disease with continued patency of the native RCA stented segment, severe stenosis of the LAD with brisk filling from the mammary graft, and continued patency of the saphenous vein graft to left circumflex distribution   2. Status post aortocoronary bypass surgery with known occlusion of the saphenous vein graft to PDA, patency of the vein graft to OM and diagonal, and continued patency of the LIMA to LAD   3. Preserved left ventricular systolic function"   As of visit in 2023: "He was lost to f/u.  He can have some chest tightness with stress.   He walks several times a week.  No sx with walking 2 miles.  Can have some tightness in his chest if he walks further.  No recent NTG.  No symptoms like prior to CABG.  Rare NTG use a few months ago. "   10/2020- went to [place] and had ECHO showing EF 55%, negative stress test.  Stress test in our office in April 2024 showed: "Findings are consistent with infarction with peri-infarct ischemia. The study is high risk.   No ST deviation was noted.   Left ventricular function is normal. End diastolic cavity size is normal. End systolic cavity size is normal.   Prior study available for comparison from  01/04/2013. There are changes compared to prior study which appear to be worse.   Study suggestive of lateral infarct with small amount of peri-infarct ischemia. Perfusion is worse from 2014 study. High risk study due to percentage of myocardium involved."  He has a trip to [place] planned on April 28.  He still feels chest discomfort when he walks despite his antianginal therapy.  He would like to have heart catheterization done prior to leaving.  Past Medical History:  Diagnosis Date   Asthma    Chest pain    01/2011 in setting of HTN urgency: Chest CT demonstrated no pulmonary embolus, postoperative appearance following CABG and small left pleural effusion and dependent atelectasis in the left lung   Coronary artery disease    CABG 12/12 (LIMA-LAD, SVG-DX, SVG-OM, SVG-PDA) c/b post op AFib Rx with amio Rx. s/p successful PCI of the right coronary artery utilizing overlapping drug-eluting stents in the mid and distal vessel (done after abnormal nuc)   Diabetes mellitus    Elevated hemoglobin    Hyperlipidemia    Hypertension     Past Surgical History:  Procedure Laterality Date   CARDIAC CATHETERIZATION     CORONARY ARTERY BYPASS GRAFT  12/30/2010   Procedure: CORONARY ARTERY BYPASS GRAFTING (CABG);  Surgeon: Delight Ovens, MD;  Location: [facility] OR;  Service: Open Heart Surgery;  Laterality:  N/A;   LEFT HEART CATHETERIZATION WITH CORONARY ANGIOGRAM N/A 01/12/2013   Procedure: LEFT HEART CATHETERIZATION WITH CORONARY ANGIOGRAM;  Surgeon: Micheline Chapman, MD;  Location: [facility] CATH LAB;  Service: Cardiovascular;  Laterality: N/A;   LEFT HEART CATHETERIZATION WITH CORONARY/GRAFT ANGIOGRAM  03/07/2011   Procedure: LEFT HEART CATHETERIZATION WITH Isabel Caprice;  Surgeon: [name], MD;  Location: [facility] CATH LAB;  Service: Cardiovascular;;   PERCUTANEOUS CORONARY STENT INTERVENTION (PCI-S)  03/07/2011   Procedure: PERCUTANEOUS CORONARY STENT INTERVENTION (PCI-S);  Surgeon: [name], MD;  Location[facility] LAB;  Service: Cardiovascular;;     Current Outpatient Medications  Medication Sig Dispense Refill   amLODipine (NORVASC) 10 MG tablet TAKE 1 TABLET BY MOUTH ONCE DAILY 30 tablet 11   aspirin EC 81 MG tablet Take 81 mg by mouth daily. Swallow whole.     carvedilol (COREG) 25 MG tablet Take 1 tablet (25 mg total) by mouth 2 (two) times daily with a meal. 180 tablet 3   clopidogrel (PLAVIX) 75 MG tablet Take 1 tablet (75 mg total) by mouth daily. 90 tablet 3   enalapril (VASOTEC) 20 MG tablet Take 1 tablet (20 mg total) by mouth 2 (two) times daily. 180 tablet 3   glimepiride (AMARYL) 4 MG tablet Take 4 mg by mouth daily with breakfast.     hydrochlorothiazide (HYDRODIURIL) 12.5 MG tablet Take 12.5 mg by mouth daily.     metFORMIN (GLUCOPHAGE) 1000 MG tablet Take 1,000 mg by mouth 2 (two) times daily with a meal.     nitroGLYCERIN (NITROSTAT) 0.4 MG SL tablet Place 1 tablet (0.4 mg total) under the tongue every 5 (five) minutes as needed. For chest pain 25 tablet 3   rosuvastatin (CRESTOR) 40 MG tablet Take 1 tablet (40 mg total) by mouth daily. 90 tablet 3   tamsulosin (FLOMAX) 0.4 MG CAPS capsule Take 0.4 mg by mouth daily. Patient report     traMADol (ULTRAM) 50 MG tablet Take 1 tablet (50 mg total) by mouth every 6 (six) hours as needed. 10 tablet 0   No current facility-administered medications for this visit.   Facility-Administered Medications Ordered in Other Visits  Medication Dose Route Frequency Provider Last Rate Last Admin   0.9 %  sodium chloride infusion  250 mL Intravenous Continuous Malachy Mood, MD   Stopped at 07/03/14 1523    Allergies:   Patient has no known allergies.    Social History:  The patient  reports that he has been smoking cigarettes. He has never used smokeless tobacco. He reports that he does not drink alcohol and does not use drugs.   Family History:  The patient's family history includes Diabetes Mellitus II in his father  and mother; Hypertension in his father and mother.    ROS:  Please see the history of present illness.   Otherwise, review of systems are positive for chest pain.   All other systems are reviewed and negative.    PHYSICAL EXAM: VS:  BP (!) 126/54   Pulse 88   Resp (!) 96   Ht 5\' 6"  (1.676 m)   Wt 181 lb 9.6 oz (82.4 kg)   BMI 29.31 kg/m  , BMI Body mass index is 29.31 kg/m. GEN: Well nourished, well developed, in no acute distress HEENT: normal Neck: no JVD, carotid bruits, or masses Cardiac: RRR; no murmurs, rubs, or gallops,no edema  Respiratory:  clear to auscultation bilaterally, normal work of breathing GI: soft, nontender, nondistended, + BS MS:  no deformity or atrophy; 2+ left radial pulse Skin: warm and dry, no rash Neuro:  Strength and sensation are intact Psych: euthymic mood, full affect   EKG:   The ekg ordered today demonstrates normal sinus rhythm, inferior Q waves, nonspecific ST-T wave changes   Recent Labs: 03/27/2022: Hemoglobin 16.3; Platelets 223 04/25/2022: ALT 18   Lipid Panel    Component Value Date/Time   CHOL 98 (L) 04/25/2022 1511   TRIG 73 04/25/2022 1511   HDL 36 (L) 04/25/2022 1511   CHOLHDL 2.7 04/25/2022 1511   CHOLHDL 3.4 04/05/2015 0852   VLDL 16 04/05/2015 0852   LDLCALC 47 04/25/2022 1511     Other studies Reviewed: Additional studies/ records that were reviewed today with results demonstrating: LDL 47 in April 2024.   ASSESSMENT AND PLAN:  CAD: Reported chest discomfort with walking while on 2 antianginals.  Plan for cardiac catheterization.  He does have travel plans coming up so we will try to expedite procedure.  Continue dual antiplatelet therapy. HTN: The current medical regimen is effective;  continue present plan and medications. Hyperlipidemia: The current medical regimen is effective;  continue present plan and medications. Type 2 diabetes: Followed by primary care doctor. Tobacco abuse: Needs to stop smoking. The  patient understands that risks include but are not limited to stroke (1 in 1000), death (1 in 1000), kidney failure [usually temporary] (1 in 500), bleeding (1 in 200), allergic reaction [possibly serious] (1 in 200), and agrees to proceed.     I explained to him that depending on what we find on the cath, his travel plans may be affected.   Current medicines are reviewed at length with the patient today.  The patient concerns regarding his medicines were addressed.  The following changes have been made:  No change  Labs/ tests ordered today include:  No orders of the defined types were placed in this encounter.   Recommend 150 minutes/week of aerobic exercise Low fat, low carb, high fiber diet recommended  Disposition:   FU in post cath   Signed, Lance Muss, MD  05/06/2022 10:54 AM    [facility] [address] [place], [place], [place]  [address] Phone: [phone]; Fax: [phone]

## 2022-05-05 NOTE — Progress Notes (Unsigned)
Cardiology Office Note   Date:  05/06/2022   ID:  Keith Flores, DOB 12/23/1960, MRN [id number]  PCP:  Rometta Emery, MD    No chief complaint on file.  CAD  Wt Readings from Last 3 Encounters:  05/06/22 181 lb 9.6 oz (82.4 kg)  05/01/22 182 lb (82.6 kg)  04/25/22 182 lb 12.8 oz (82.9 kg)       History of Present Illness: Keith Flores is a 62 y.o. male  saw Dr. Excell Seltzer in the past and has coronary artery disease status post CABG in 2012. The patient had early graft closure involving the vein graft to right coronary artery and he underwent extensive stenting of the native RCA. He's followed for hypertension, Type II DM, and hyperlipidemia as well.  Last cath in 2014 showed: " Severe native three-vessel coronary artery disease with continued patency of the native RCA stented segment, severe stenosis of the LAD with brisk filling from the mammary graft, and continued patency of the saphenous vein graft to left circumflex distribution   2. Status post aortocoronary bypass surgery with known occlusion of the saphenous vein graft to PDA, patency of the vein graft to OM and diagonal, and continued patency of the LIMA to LAD   3. Preserved left ventricular systolic function"   As of visit in 2023: "He was lost to f/u.  He can have some chest tightness with stress.   He walks several times a week.  No sx with walking 2 miles.  Can have some tightness in his chest if he walks further.  No recent NTG.  No symptoms like prior to CABG.  Rare NTG use a few months ago. "   10/2020- went to [place] and had ECHO showing EF 55%, negative stress test.  Stress test in our office in April 2024 showed: "Findings are consistent with infarction with peri-infarct ischemia. The study is high risk.   No ST deviation was noted.   Left ventricular function is normal. End diastolic cavity size is normal. End systolic cavity size is normal.   Prior study available for comparison from  01/04/2013. There are changes compared to prior study which appear to be worse.   Study suggestive of lateral infarct with small amount of peri-infarct ischemia. Perfusion is worse from 2014 study. High risk study due to percentage of myocardium involved."  He has a trip to [place] planned on April 28.  He still feels chest discomfort when he walks despite his antianginal therapy.  He would like to have heart catheterization done prior to leaving.  Past Medical History:  Diagnosis Date   Asthma    Chest pain    01/2011 in setting of HTN urgency: Chest CT demonstrated no pulmonary embolus, postoperative appearance following CABG and small left pleural effusion and dependent atelectasis in the left lung   Coronary artery disease    CABG 12/12 (LIMA-LAD, SVG-DX, SVG-OM, SVG-PDA) c/b post op AFib Rx with amio Rx. s/p successful PCI of the right coronary artery utilizing overlapping drug-eluting stents in the mid and distal vessel (done after abnormal nuc)   Diabetes mellitus    Elevated hemoglobin    Hyperlipidemia    Hypertension     Past Surgical History:  Procedure Laterality Date   CARDIAC CATHETERIZATION     CORONARY ARTERY BYPASS GRAFT  12/30/2010   Procedure: CORONARY ARTERY BYPASS GRAFTING (CABG);  Surgeon: Delight Ovens, MD;  Location: [facility] OR;  Service: Open Heart Surgery;  Laterality:  N/A;   LEFT HEART CATHETERIZATION WITH CORONARY ANGIOGRAM N/A 01/12/2013   Procedure: LEFT HEART CATHETERIZATION WITH CORONARY ANGIOGRAM;  Surgeon: Micheline Chapman, MD;  Location: [facility] CATH LAB;  Service: Cardiovascular;  Laterality: N/A;   LEFT HEART CATHETERIZATION WITH CORONARY/GRAFT ANGIOGRAM  03/07/2011   Procedure: LEFT HEART CATHETERIZATION WITH Isabel Caprice;  Surgeon: [name], MD;  Location: [facility] CATH LAB;  Service: Cardiovascular;;   PERCUTANEOUS CORONARY STENT INTERVENTION (PCI-S)  03/07/2011   Procedure: PERCUTANEOUS CORONARY STENT INTERVENTION (PCI-S);  Surgeon: [name], MD;  Location: [facility] CATH LAB;  Service: Cardiovascular;;     Current Outpatient Medications  Medication Sig Dispense Refill   amLODipine (NORVASC) 10 MG tablet TAKE 1 TABLET BY MOUTH ONCE DAILY 30 tablet 11   aspirin EC 81 MG tablet Take 81 mg by mouth daily. Swallow whole.     carvedilol (COREG) 25 MG tablet Take 1 tablet (25 mg total) by mouth 2 (two) times daily with a meal. 180 tablet 3   clopidogrel (PLAVIX) 75 MG tablet Take 1 tablet (75 mg total) by mouth daily. 90 tablet 3   enalapril (VASOTEC) 20 MG tablet Take 1 tablet (20 mg total) by mouth 2 (two) times daily. 180 tablet 3   glimepiride (AMARYL) 4 MG tablet Take 4 mg by mouth daily with breakfast.     hydrochlorothiazide (HYDRODIURIL) 12.5 MG tablet Take 12.5 mg by mouth daily.     metFORMIN (GLUCOPHAGE) 1000 MG tablet Take 1,000 mg by mouth 2 (two) times daily with a meal.     nitroGLYCERIN (NITROSTAT) 0.4 MG SL tablet Place 1 tablet (0.4 mg total) under the tongue every 5 (five) minutes as needed. For chest pain 25 tablet 3   rosuvastatin (CRESTOR) 40 MG tablet Take 1 tablet (40 mg total) by mouth daily. 90 tablet 3   tamsulosin (FLOMAX) 0.4 MG CAPS capsule Take 0.4 mg by mouth daily. Patient report     traMADol (ULTRAM) 50 MG tablet Take 1 tablet (50 mg total) by mouth every 6 (six) hours as needed. 10 tablet 0   No current facility-administered medications for this visit.   Facility-Administered Medications Ordered in Other Visits  Medication Dose Route Frequency Provider Last Rate Last Admin   0.9 %  sodium chloride infusion  250 mL Intravenous Continuous Malachy Mood, MD   Stopped at 07/03/14 1523    Allergies:   Patient has no known allergies.    Social History:  The patient  reports that he has been smoking cigarettes. He has never used smokeless tobacco. He reports that he does not drink alcohol and does not use drugs.   Family History:  The patient's family history includes Diabetes Mellitus II in his father  and mother; Hypertension in his father and mother.    ROS:  Please see the history of present illness.   Otherwise, review of systems are positive for chest pain.   All other systems are reviewed and negative.    PHYSICAL EXAM: VS:  BP (!) 126/54   Pulse 88   Resp (!) 96   Ht 5\' 6"  (1.676 m)   Wt 181 lb 9.6 oz (82.4 kg)   BMI 29.31 kg/m  , BMI Body mass index is 29.31 kg/m. GEN: Well nourished, well developed, in no acute distress HEENT: normal Neck: no JVD, carotid bruits, or masses Cardiac: RRR; no murmurs, rubs, or gallops,no edema  Respiratory:  clear to auscultation bilaterally, normal work of breathing GI: soft, nontender, nondistended, + BS MS:  no deformity or atrophy; 2+ left radial pulse Skin: warm and dry, no rash Neuro:  Strength and sensation are intact Psych: euthymic mood, full affect   EKG:   The ekg ordered today demonstrates normal sinus rhythm, inferior Q waves, nonspecific ST-T wave changes   Recent Labs: 03/27/2022: Hemoglobin 16.3; Platelets 223 04/25/2022: ALT 18   Lipid Panel    Component Value Date/Time   CHOL 98 (L) 04/25/2022 1511   TRIG 73 04/25/2022 1511   HDL 36 (L) 04/25/2022 1511   CHOLHDL 2.7 04/25/2022 1511   CHOLHDL 3.4 04/05/2015 0852   VLDL 16 04/05/2015 0852   LDLCALC 47 04/25/2022 1511     Other studies Reviewed: Additional studies/ records that were reviewed today with results demonstrating: LDL 47 in April 2024.   ASSESSMENT AND PLAN:  CAD: Reported chest discomfort with walking while on 2 antianginals.  Plan for cardiac catheterization.  He does have travel plans coming up so we will try to expedite procedure.  Continue dual antiplatelet therapy. HTN: The current medical regimen is effective;  continue present plan and medications. Hyperlipidemia: The current medical regimen is effective;  continue present plan and medications. Type 2 diabetes: Followed by primary care doctor. Tobacco abuse: Needs to stop smoking. The  patient understands that risks include but are not limited to stroke (1 in 1000), death (1 in 1000), kidney failure [usually temporary] (1 in 500), bleeding (1 in 200), allergic reaction [possibly serious] (1 in 200), and agrees to proceed.     I explained to him that depending on what we find on the cath, his travel plans may be affected.   Current medicines are reviewed at length with the patient today.  The patient concerns regarding his medicines were addressed.  The following changes have been made:  No change  Labs/ tests ordered today include:  No orders of the defined types were placed in this encounter.   Recommend 150 minutes/week of aerobic exercise Low fat, low carb, high fiber diet recommended  Disposition:   FU in post cath   Signed, Lance Muss, MD  05/06/2022 10:54 AM    [facility] [address] [place], [place], [place]  [address] Phone: [phone]; Fax: [phone]

## 2022-05-06 ENCOUNTER — Encounter: Payer: Self-pay | Admitting: Interventional Cardiology

## 2022-05-06 ENCOUNTER — Ambulatory Visit: Payer: BLUE CROSS/BLUE SHIELD | Attending: Interventional Cardiology | Admitting: Interventional Cardiology

## 2022-05-06 VITALS — BP 126/54 | HR 88 | Resp 96 | Ht 66.0 in | Wt 181.6 lb

## 2022-05-06 DIAGNOSIS — I10 Essential (primary) hypertension: Secondary | ICD-10-CM | POA: Diagnosis not present

## 2022-05-06 DIAGNOSIS — E119 Type 2 diabetes mellitus without complications: Secondary | ICD-10-CM

## 2022-05-06 DIAGNOSIS — E782 Mixed hyperlipidemia: Secondary | ICD-10-CM

## 2022-05-06 DIAGNOSIS — Z720 Tobacco use: Secondary | ICD-10-CM

## 2022-05-06 DIAGNOSIS — I2510 Atherosclerotic heart disease of native coronary artery without angina pectoris: Secondary | ICD-10-CM | POA: Diagnosis not present

## 2022-05-06 NOTE — Patient Instructions (Signed)
Medication Instructions:  Your physician recommends that you continue on your current medications as directed. Please refer to the Current Medication list given to you today.  *If you need a refill on your cardiac medications before your next appointment, please call your pharmacy*   Lab Work: Lab work to be done today--BMP and CBC If you have labs (blood work) drawn today and your tests are completely normal, you will receive your results only by: MyChart Message (if you have MyChart) OR A paper copy in the mail If you have any lab test that is abnormal or we need to change your treatment, we will call you to review the results.   Testing/Procedures: Your physician has requested that you have a cardiac catheterization. Cardiac catheterization is used to diagnose and/or treat various heart conditions. Doctors may recommend this procedure for a number of different reasons. The most common reason is to evaluate chest pain. Chest pain can be a symptom of coronary artery disease (CAD), and cardiac catheterization can show whether plaque is narrowing or blocking your heart's arteries. This procedure is also used to evaluate the valves, as well as measure the blood flow and oxygen levels in different parts of your heart. For further information please visit [email or web address] Please follow instruction sheet, as given. Scheduled for May 08, 2022   Follow-Up: At [facility], you and your health needs are our priority.  As part of our continuing mission to provide you with exceptional heart care, we have created designated Provider Care Teams.  These Care Teams include your primary Cardiologist (physician) and Advanced Practice Providers (APPs -  Physician Assistants and Nurse Practitioners) who all work together to provide you with the care you need, when you need it.  We recommend signing up for the patient portal called "MyChart".  Sign up information is provided on this After Visit  Summary.  MyChart is used to connect with patients for Virtual Visits (Telemedicine).  Patients are able to view lab/test results, encounter notes, upcoming appointments, etc.  Non-urgent messages can be sent to your provider as well.   To learn more about what you can do with MyChart, go to [email or web address].    Your next appointment:   To be arranged after procedure  Provider:   Lance Muss, MD or APP    Other Instructions   [name]. [name] [address] [place], [place] [address] [id number] [name] [address] Dept: [phone] Loc: [phone]  Keith Flores  05/06/2022  You are scheduled for a Cardiac Catheterization on Thursday, April 18 with Dr. Lance Muss.  1. Please arrive at the [facility] (Main Entrance A) at [facility]: [address] [place], [place] [address] at 7:00 AM (This time is two hours before your procedure to ensure your preparation). Free valet parking service is available. You will check in at ADMITTING. The support person will be asked to wait in the waiting room.  It is OK to have someone drop you off and come back when you are ready to be discharged.    Special note: Every effort is made to have your procedure done on time. Please understand that emergencies sometimes delay scheduled procedures.  2. Diet: Do not eat solid foods after midnight.  The patient may have clear liquids until 5am upon the day of the procedure.  3. Labs: done in office today.  4. Medication instructions in preparation for your procedure:  Do not take any diabetes medication the morning of the procedure. Do not take metformin the morning of the procedure and for 48 hours after procedure.  Do not take hydrochlorothiazide the morning of the procedure   Contrast Allergy: No   On the morning of your procedure, take your Aspirin 81 mg and Plavix/Clopidogrel and any  morning medicines NOT listed above.  You may use sips of water.  5. Plan to go home the same day, you will only stay overnight if medically necessary. 6. Bring a current list of your medications and current insurance cards. 7. You MUST have a responsible person to drive you home. 8. Someone MUST be with you the first 24 hours after you arrive home or your discharge will be delayed. 9. Please wear clothes that are easy to get on and off and wear slip-on shoes.  Thank you for allowing [place] to care for you!   -- [name]

## 2022-05-07 ENCOUNTER — Encounter: Payer: Self-pay | Admitting: Hematology

## 2022-05-07 ENCOUNTER — Telehealth: Payer: Self-pay | Admitting: *Deleted

## 2022-05-07 LAB — CBC
Hematocrit: 48.9 % (ref 37.5–51.0)
Hemoglobin: 16 g/dL (ref 13.0–17.7)
MCH: 25.1 pg — ABNORMAL LOW (ref 26.6–33.0)
MCHC: 32.7 g/dL (ref 31.5–35.7)
MCV: 77 fL — ABNORMAL LOW (ref 79–97)
Platelets: 250 10*3/uL (ref 150–450)
RBC: 6.38 x10E6/uL — ABNORMAL HIGH (ref 4.14–5.80)
RDW: 15.7 % — ABNORMAL HIGH (ref 11.6–15.4)
WBC: 7.6 10*3/uL (ref 3.4–10.8)

## 2022-05-07 LAB — BASIC METABOLIC PANEL
BUN/Creatinine Ratio: 20 (ref 10–24)
BUN: 19 mg/dL (ref 8–27)
CO2: 23 mmol/L (ref 20–29)
Calcium: 10.2 mg/dL (ref 8.6–10.2)
Chloride: 98 mmol/L (ref 96–106)
Creatinine, Ser: 0.94 mg/dL (ref 0.76–1.27)
Glucose: 145 mg/dL — ABNORMAL HIGH (ref 70–99)
Potassium: 4 mmol/L (ref 3.5–5.2)
Sodium: 140 mmol/L (ref 134–144)
eGFR: 91 mL/min/{1.73_m2} (ref >59)

## 2022-05-07 NOTE — Telephone Encounter (Signed)
Cardiac Catheterization scheduled at [facility] for: Thursday May 08, 2022 9 AM Arrival time [facility] Main Entrance A at: 7 AM  Nothing to eat after midnight prior to procedure, clear liquids until 5 AM day of procedure.  Medication instructions: -Hold:  Metformin-day of procedure and 48 hours Glimepiride-AM of procedure HCTZ-AM of procedure -Other usual morning medications can be taken with sips of water including aspirin 81 mg and Plavix 75 mg.  Confirmed patient has responsible adult to drive home post procedure and be with patient first 24 hours after arriving home.  Plan to go home the same day, you will only stay overnight if medically necessary.  Assistance of [employer]hd ID [id number]-reviewed procedure instructions with patient.

## 2022-05-08 ENCOUNTER — Other Ambulatory Visit: Payer: Self-pay

## 2022-05-08 ENCOUNTER — Ambulatory Visit (HOSPITAL_COMMUNITY)
Admission: RE | Admit: 2022-05-08 | Discharge: 2022-05-08 | Disposition: A | Discharge status: Home / Self Care | Payer: BLUE CROSS/BLUE SHIELD | Attending: Interventional Cardiology | Admitting: Interventional Cardiology

## 2022-05-08 ENCOUNTER — Encounter (HOSPITAL_COMMUNITY)
Admission: RE | Disposition: A | Discharge status: Home / Self Care | Payer: Self-pay | Source: Home / Self Care | Attending: Interventional Cardiology

## 2022-05-08 DIAGNOSIS — E785 Hyperlipidemia, unspecified: Secondary | ICD-10-CM | POA: Insufficient documentation

## 2022-05-08 DIAGNOSIS — Z7984 Long term (current) use of oral hypoglycemic drugs: Secondary | ICD-10-CM | POA: Diagnosis not present

## 2022-05-08 DIAGNOSIS — I2582 Chronic total occlusion of coronary artery: Secondary | ICD-10-CM | POA: Diagnosis not present

## 2022-05-08 DIAGNOSIS — Z951 Presence of aortocoronary bypass graft: Secondary | ICD-10-CM | POA: Diagnosis not present

## 2022-05-08 DIAGNOSIS — I25118 Atherosclerotic heart disease of native coronary artery with other forms of angina pectoris: Secondary | ICD-10-CM | POA: Insufficient documentation

## 2022-05-08 DIAGNOSIS — I10 Essential (primary) hypertension: Secondary | ICD-10-CM | POA: Diagnosis not present

## 2022-05-08 DIAGNOSIS — Z7902 Long term (current) use of antithrombotics/antiplatelets: Secondary | ICD-10-CM | POA: Insufficient documentation

## 2022-05-08 DIAGNOSIS — Z955 Presence of coronary angioplasty implant and graft: Secondary | ICD-10-CM | POA: Insufficient documentation

## 2022-05-08 DIAGNOSIS — F17210 Nicotine dependence, cigarettes, uncomplicated: Secondary | ICD-10-CM | POA: Diagnosis not present

## 2022-05-08 DIAGNOSIS — E119 Type 2 diabetes mellitus without complications: Secondary | ICD-10-CM | POA: Insufficient documentation

## 2022-05-08 DIAGNOSIS — R079 Chest pain, unspecified: Secondary | ICD-10-CM | POA: Diagnosis present

## 2022-05-08 HISTORY — PX: CORONARY BALLOON ANGIOPLASTY: CATH118233

## 2022-05-08 HISTORY — PX: LEFT HEART CATH AND CORS/GRAFTS ANGIOGRAPHY: CATH118250

## 2022-05-08 HISTORY — PX: CORONARY STENT INTERVENTION: CATH118234

## 2022-05-08 LAB — POCT ACTIVATED CLOTTING TIME
Activated Clotting Time: 288 seconds
Activated Clotting Time: 390 seconds

## 2022-05-08 LAB — GLUCOSE, CAPILLARY: Glucose-Capillary: 181 mg/dL — ABNORMAL HIGH (ref 70–99)

## 2022-05-08 SURGERY — LEFT HEART CATH AND CORS/GRAFTS ANGIOGRAPHY
Anesthesia: LOCAL

## 2022-05-08 MED ORDER — LABETALOL HCL 5 MG/ML IV SOLN: 10.0000 mg | INTRAVENOUS | Status: DC | PRN

## 2022-05-08 MED ORDER — MIDAZOLAM HCL 2 MG/2ML IJ SOLN
INTRAMUSCULAR | Status: DC | PRN
  Administered 2022-05-08: 2 mg via INTRAVENOUS
  Administered 2022-05-08 (×2): 1 mg via INTRAVENOUS

## 2022-05-08 MED ORDER — LIDOCAINE HCL (PF) 1 % IJ SOLN
INTRAMUSCULAR | Status: DC | PRN
  Administered 2022-05-08: 5 mL

## 2022-05-08 MED ORDER — VERAPAMIL HCL 2.5 MG/ML IV SOLN
INTRAVENOUS | Status: DC | PRN
  Administered 2022-05-08: 10 mL via INTRA_ARTERIAL

## 2022-05-08 MED ORDER — MIDAZOLAM HCL 2 MG/2ML IJ SOLN
INTRAMUSCULAR | Status: AC
  Filled 2022-05-08: qty 2

## 2022-05-08 MED ORDER — HEPARIN SODIUM (PORCINE) 1000 UNIT/ML IJ SOLN
INTRAMUSCULAR | Status: AC
  Filled 2022-05-08: qty 10

## 2022-05-08 MED ORDER — SODIUM CHLORIDE 0.9% FLUSH: 3.0000 mL | INTRAVENOUS | Status: DC | PRN

## 2022-05-08 MED ORDER — VERAPAMIL HCL 2.5 MG/ML IV SOLN
INTRAVENOUS | Status: AC
  Filled 2022-05-08: qty 2

## 2022-05-08 MED ORDER — NITROGLYCERIN 1 MG/10 ML FOR IR/CATH LAB
INTRA_ARTERIAL | Status: AC
  Filled 2022-05-08: qty 10

## 2022-05-08 MED ORDER — ONDANSETRON HCL 4 MG/2ML IJ SOLN: 4.0000 mg | Freq: Four times a day (QID) | INTRAMUSCULAR | Status: DC | PRN

## 2022-05-08 MED ORDER — CLOPIDOGREL BISULFATE 75 MG PO TABS
75.0000 mg | ORAL_TABLET | ORAL | Status: AC
  Administered 2022-05-08: 75 mg via ORAL

## 2022-05-08 MED ORDER — SODIUM CHLORIDE 0.9 % WEIGHT BASED INFUSION: 1.0000 mL/kg/h | INTRAVENOUS | Status: DC

## 2022-05-08 MED ORDER — HEPARIN SODIUM (PORCINE) 1000 UNIT/ML IJ SOLN
INTRAMUSCULAR | Status: DC | PRN
  Administered 2022-05-08: 4000 [IU] via INTRAVENOUS
  Administered 2022-05-08: 2000 [IU] via INTRAVENOUS
  Administered 2022-05-08: 6000 [IU] via INTRAVENOUS

## 2022-05-08 MED ORDER — FENTANYL CITRATE (PF) 100 MCG/2ML IJ SOLN
INTRAMUSCULAR | Status: AC
  Filled 2022-05-08: qty 2

## 2022-05-08 MED ORDER — ASPIRIN 81 MG PO CHEW: 81.0000 mg | CHEWABLE_TABLET | Freq: Every day | ORAL | Status: DC

## 2022-05-08 MED ORDER — SODIUM CHLORIDE 0.9 % WEIGHT BASED INFUSION
3.0000 mL/kg/h | INTRAVENOUS | Status: AC
  Administered 2022-05-08: 3 mL/kg/h via INTRAVENOUS

## 2022-05-08 MED ORDER — CLOPIDOGREL BISULFATE 300 MG PO TABS
ORAL_TABLET | ORAL | Status: AC
  Filled 2022-05-08: qty 1

## 2022-05-08 MED ORDER — FENTANYL CITRATE (PF) 100 MCG/2ML IJ SOLN
INTRAMUSCULAR | Status: DC | PRN
  Administered 2022-05-08 (×3): 25 ug via INTRAVENOUS

## 2022-05-08 MED ORDER — HEPARIN (PORCINE) IN NACL 1000-0.9 UT/500ML-% IV SOLN
INTRAVENOUS | Status: DC | PRN
  Administered 2022-05-08 (×2): 500 mL

## 2022-05-08 MED ORDER — SODIUM CHLORIDE 0.9% FLUSH: 3.0000 mL | Freq: Two times a day (BID) | INTRAVENOUS | Status: DC

## 2022-05-08 MED ORDER — ASPIRIN 81 MG PO CHEW
81.0000 mg | CHEWABLE_TABLET | ORAL | Status: AC
  Administered 2022-05-08: 81 mg via ORAL

## 2022-05-08 MED ORDER — METFORMIN HCL 1000 MG PO TABS: 1000.0000 mg | ORAL_TABLET | Freq: Two times a day (BID) | ORAL | Status: AC

## 2022-05-08 MED ORDER — SODIUM CHLORIDE 0.9% FLUSH
3.0000 mL | INTRAVENOUS | Status: DC | PRN
  Administered 2022-05-08: 3 mL via INTRAVENOUS

## 2022-05-08 MED ORDER — HYDRALAZINE HCL 20 MG/ML IJ SOLN: 10.0000 mg | INTRAMUSCULAR | Status: DC | PRN

## 2022-05-08 MED ORDER — IOHEXOL 350 MG/ML SOLN
INTRAVENOUS | Status: DC | PRN
  Administered 2022-05-08: 170 mL

## 2022-05-08 MED ORDER — SODIUM CHLORIDE 0.9 % IV SOLN: 250.0000 mL | INTRAVENOUS | Status: DC | PRN

## 2022-05-08 MED ORDER — CLOPIDOGREL BISULFATE 300 MG PO TABS
ORAL_TABLET | ORAL | Status: DC | PRN
  Administered 2022-05-08: 300 mg via ORAL

## 2022-05-08 MED ORDER — LIDOCAINE HCL (PF) 1 % IJ SOLN
INTRAMUSCULAR | Status: AC
  Filled 2022-05-08: qty 30

## 2022-05-08 MED ORDER — ACETAMINOPHEN 325 MG PO TABS: 650.0000 mg | ORAL_TABLET | ORAL | Status: DC | PRN

## 2022-05-08 MED ORDER — SODIUM CHLORIDE 0.9 % IV SOLN: INTRAVENOUS | Status: DC

## 2022-05-08 MED ORDER — CLOPIDOGREL BISULFATE 75 MG PO TABS: 75.0000 mg | ORAL_TABLET | Freq: Every day | ORAL | Status: DC

## 2022-05-08 MED ORDER — NITROGLYCERIN 1 MG/10 ML FOR IR/CATH LAB
INTRA_ARTERIAL | Status: DC | PRN
  Administered 2022-05-08: 500 ug via INTRA_ARTERIAL
  Administered 2022-05-08: 200 ug via INTRACORONARY

## 2022-05-08 SURGICAL SUPPLY — 22 items
BALLN EMERGE MR 2.5X12 (BALLOONS) ×1
BALLN ~~LOC~~ EMERGE MR 3.25X12 (BALLOONS) ×1
BALLOON EMERGE MR 2.5X12 (BALLOONS) IMPLANT
BALLOON ~~LOC~~ EMERGE MR 3.25X12 (BALLOONS) IMPLANT
CATH INFINITI 5FR MULTPACK ANG (CATHETERS) IMPLANT
CATH LAUNCHER 6FR EBU3.5 (CATHETERS) IMPLANT
DEVICE RAD TR BAND REGULAR (VASCULAR PRODUCTS) IMPLANT
ELECT DEFIB PAD ADLT CADENCE (PAD) IMPLANT
GLIDESHEATH SLEND SS 6F .021 (SHEATH) IMPLANT
GUIDEWIRE INQWIRE 1.5J.035X260 (WIRE) IMPLANT
INQWIRE 1.5J .035X260CM (WIRE) ×1
KIT ENCORE 26 ADVANTAGE (KITS) IMPLANT
KIT HEART LEFT (KITS) ×2 IMPLANT
PACK CARDIAC CATHETERIZATION (CUSTOM PROCEDURE TRAY) ×2 IMPLANT
SHEATH PROBE COVER 6X72 (BAG) IMPLANT
STENT SYNERGY XD 3.0X20 (Permanent Stent) IMPLANT
SYNERGY XD 3.0X20 (Permanent Stent) ×1 IMPLANT
SYR MEDRAD MARK 7 150ML (SYRINGE) ×2 IMPLANT
TRANSDUCER W/STOPCOCK (MISCELLANEOUS) ×2 IMPLANT
TUBING CIL FLEX 10 FLL-RA (TUBING) ×2 IMPLANT
VALVE GUARDIAN II ~~LOC~~ HEMO (MISCELLANEOUS) IMPLANT
WIRE RUNTHROUGH .014X180CM (WIRE) IMPLANT

## 2022-05-08 NOTE — Discharge Summary (Addendum)
Discharge Summary for Same Day PCI   Patient ID: Keith Flores MRN: [id number]; DOB: [birth year]  Admit date: 05/08/2022 Discharge date: 05/08/2022  Primary Care Provider: Rometta Emery, MD  Primary Cardiologist: Lance Muss, MD  Primary Electrophysiologist:  None   Discharge Diagnoses    Active Problems:   Chest pain  Diagnostic Studies/Procedures    Cardiac Catheterization 05/08/2022:    RPDA lesion is 75% stenosed, noted in the distal vessel.   Prox RCA lesion is 25% stenosed.   SVG to PDA occluded   Prox Cx lesion is 80% stenosed.  A drug-eluting stent was successfully placed using a SYNERGY XD 3.0X20, postdilated to 3.3 mm in diameter.   Post intervention, there is a 0% residual stenosis.   75% mid LAD lesion at the origin of the first diagonal and proximal to the second diagonal which had been bypassed.  The SVG to diagonal was occluded.  Balloon angioplasty of the mid LAD lesion with a 2.5 x 12 Emerge balloon was performed to improve flow to the second diagonal. Mid LAD-2 lesion is 100% stenosed.  LIMA to LAD is widely patent.  Moderate disease in the apical LAD.   Post intervention, there is a 10% residual stenosis.  No visible dissection.   1st Mrg-1 lesion is 100% stenosed.  SVG to marginal is patent.  At the insertion of the graft, there is severe native distal vessel disease.  Not a target for PCI.   Mid LAD-2 lesion is 100% stenosed.  LIMA to LAD is widely patent.  Moderate disease in the apical LAD.   SVG to second diagonal is occluded.   Previously placed Mid RCA to Dist RCA stent of unknown type is  widely patent.   Balloon angioplasty was performed using a BALLN EMERGE MR 2.5X12.   The left ventricular systolic function is normal.   LV end diastolic pressure is normal.   The left ventricular ejection fraction is 55-65% by visual estimate.   There is no aortic valve stenosis.   Successful PCI of the mid circumflex with drug-eluting stent and mid  LAD with PTCA.  Continue aggressive secondary prevention.  Continue dual antiplatelet therapy.  I would continue clopidogrel long-term given his diffuse disease.  Plan for same-day discharge.  Results were conveyed to his son.  Diagnostic Dominance: Right  Intervention   Echo: 04/30/2022  IMPRESSIONS     1. Left ventricular ejection fraction, by estimation, is 60 to 65%. The  left ventricle has normal function. The left ventricle has no regional  wall motion abnormalities. Left ventricular diastolic parameters are  consistent with Grade I diastolic  dysfunction (impaired relaxation). GLS -12.1%   2. Right ventricular systolic function is normal. The right ventricular  size is normal. There is normal pulmonary artery systolic pressure.   3. The mitral valve is normal in structure. No evidence of mitral valve  regurgitation. No evidence of mitral stenosis.   4. The aortic valve is normal in structure. Aortic valve regurgitation is  not visualized. No aortic stenosis is present.   5. The inferior vena cava is normal in size with greater than 50%  respiratory variability, suggesting right atrial pressure of 3 mmHg.   FINDINGS   Left Ventricle: Left ventricular ejection fraction, by estimation, is 60  to 65%. The left ventricle has normal function. The left ventricle has no  regional wall motion abnormalities. Definity contrast agent was given IV  to delineate the left ventricular   endocardial borders. The  left ventricular internal cavity size was normal  in size. There is no left ventricular hypertrophy. Left ventricular  diastolic parameters are consistent with Grade I diastolic dysfunction  (impaired relaxation).   Right Ventricle: The right ventricular size is normal. No increase in  right ventricular wall thickness. Right ventricular systolic function is  normal. There is normal pulmonary artery systolic pressure. The tricuspid  regurgitant velocity is 1.78 m/s, and   with an  assumed right atrial pressure of 3 mmHg, the estimated right  ventricular systolic pressure is 15.7 mmHg.   Left Atrium: Left atrial size was normal in size.   Right Atrium: Right atrial size was normal in size.   Pericardium: There is no evidence of pericardial effusion.   Mitral Valve: The mitral valve is normal in structure. No evidence of  mitral valve regurgitation. No evidence of mitral valve stenosis.   Tricuspid Valve: The tricuspid valve is normal in structure. Tricuspid  valve regurgitation is not demonstrated. No evidence of tricuspid  stenosis.   Aortic Valve: The aortic valve is normal in structure. Aortic valve  regurgitation is not visualized. No aortic stenosis is present.   Pulmonic Valve: The pulmonic valve was normal in structure. Pulmonic valve  regurgitation is not visualized. No evidence of pulmonic stenosis.   Aorta: The aortic root is normal in size and structure.   Venous: The inferior vena cava is normal in size with greater than 50%  respiratory variability, suggesting right atrial pressure of 3 mmHg.   IAS/Shunts: No atrial level shunt detected by color flow Doppler.    _____________   History of Present Illness     Keith Flores is a 62 y.o. male with past medical history of CAD status post CABG 2010 (LIMA to LAD, SVG to diagonal, SVG to OM, SVG to PDA), PCI with DES x 2 to RCA 02/2011 due to early graft occlusion, hypertension, hyperlipidemia, diabetes, tobacco use who presented to the office on 4/5 with complaints of exertional chest pain.  Set up for outpatient stress test which was a high risk study suggestive of lateral infarct with small amount of peri-infarct ischemia.  Seen back in the office on 4/16 and set up for outpatient cardiac catheterization.  Hospital Course     The patient underwent cardiac cath as noted above with successful PCI of the mid circumflex with DES x 1 and PTCA of the mid LAD. See cath report above. Recommendations  to continue on DAPT with aspirin/Plavix for 1 year and long-term Plavix given his diffuse disease. The patient was seen by cardiac rehab while in short stay. There were no observed complications post cath. Left radial cath site was re-evaluated prior to discharge and found to be stable without any complications. Instructions/precautions regarding cath site care were given prior to discharge.  Keith Flores was seen by Dr. Eldridge Dace and determined stable for discharge home. Follow up with our office has been arranged. Medications are listed below. Pertinent changes include n/a.    _____________  Cath/PCI Registry Performance & Quality Measures: Aspirin prescribed? - Yes ADP Receptor Inhibitor (Plavix/Clopidogrel, Brilinta/Ticagrelor or Effient/Prasugrel) prescribed (includes medically managed patients)? - Yes High Intensity Statin (Lipitor 40-80mg  or Crestor 20-40mg ) prescribed? - Yes For EF <40%, was ACEI/ARB prescribed? - Not Applicable (EF >/= 40%) For EF <40%, Aldosterone Antagonist (Spironolactone or Eplerenone) prescribed? - Not Applicable (EF >/= 40%) Cardiac Rehab Phase II ordered (Included Medically managed Patients)? - Yes  _____________   Discharge Vitals Blood pressure (!) 146/78,  pulse 68, temperature 99.2 F (37.3 C), temperature source Oral, resp. rate 17, height 5\' 6"  (1.676 m), weight 83 kg, SpO2 98 %.  Filed Weights   05/08/22 0738  Weight: 83 kg    Last Labs & Radiologic Studies    CBC Recent Labs    05/06/22 1109  WBC 7.6  HGB 16.0  HCT 48.9  MCV 77*  PLT 250   Basic Metabolic Panel Recent Labs    16/10/96 1109  NA 140  K 4.0  CL 98  CO2 23  GLUCOSE 145*  BUN 19  CREATININE 0.94  CALCIUM 10.2   Liver Function Tests No results for input(s): "AST", "ALT", "ALKPHOS", "BILITOT", "PROT", "ALBUMIN" in the last 72 hours. No results for input(s): "LIPASE", "AMYLASE" in the last 72 hours. High Sensitivity Troponin:   No results for input(s):  "TROPONINIHS" in the last 720 hours.  BNP Invalid input(s): "POCBNP" D-Dimer No results for input(s): "DDIMER" in the last 72 hours. Hemoglobin A1C No results for input(s): "HGBA1C" in the last 72 hours. Fasting Lipid Panel No results for input(s): "CHOL", "HDL", "LDLCALC", "TRIG", "CHOLHDL", "LDLDIRECT" in the last 72 hours. Thyroid Function Tests No results for input(s): "TSH", "T4TOTAL", "T3FREE", "THYROIDAB" in the last 72 hours.  Invalid input(s): "FREET3" _____________  CARDIAC CATHETERIZATION  Result Date: 05/08/2022   RPDA lesion is 75% stenosed, noted in the distal vessel.   Prox RCA lesion is 25% stenosed.   SVG to PDA occluded   Prox Cx lesion is 80% stenosed.  A drug-eluting stent was successfully placed using a SYNERGY XD 3.0X20, postdilated to 3.3 mm in diameter.   Post intervention, there is a 0% residual stenosis.   75% mid LAD lesion at the origin of the first diagonal and proximal to the second diagonal which had been bypassed.  The SVG to diagonal was occluded.  Balloon angioplasty of the mid LAD lesion with a 2.5 x 12 Emerge balloon was performed to improve flow to the second diagonal. Mid LAD-2 lesion is 100% stenosed.  LIMA to LAD is widely patent.  Moderate disease in the apical LAD.   Post intervention, there is a 10% residual stenosis.  No visible dissection.   1st Mrg-1 lesion is 100% stenosed.  SVG to marginal is patent.  At the insertion of the graft, there is severe native distal vessel disease.  Not a target for PCI.   Mid LAD-2 lesion is 100% stenosed.  LIMA to LAD is widely patent.  Moderate disease in the apical LAD.   SVG to second diagonal is occluded.   Previously placed Mid RCA to Dist RCA stent of unknown type is  widely patent.   Balloon angioplasty was performed using a BALLN EMERGE MR 2.5X12.   The left ventricular systolic function is normal.   LV end diastolic pressure is normal.   The left ventricular ejection fraction is 55-65% by visual estimate.   There  is no aortic valve stenosis. Successful PCI of the mid circumflex with drug-eluting stent and mid LAD with PTCA.  Continue aggressive secondary prevention.  Continue dual antiplatelet therapy.  I would continue clopidogrel long-term given his diffuse disease.  Plan for same-day discharge.  Results were conveyed to his son.   MYOCARDIAL PERFUSION IMAGING  Result Date: 05/01/2022   Findings are consistent with infarction with peri-infarct ischemia. The study is high risk.   No ST deviation was noted.   Left ventricular function is normal. End diastolic cavity size is normal. End systolic cavity  size is normal.   Prior study available for comparison from 01/04/2013. There are changes compared to prior study which appear to be worse. Study suggestive of lateral infarct with small amount of peri-infarct ischemia. Perfusion is worse from 2014 study. High risk study due to percentage of myocardium involved.   ECHOCARDIOGRAM COMPLETE  Result Date: 04/30/2022    ECHOCARDIOGRAM REPORT   Patient Name:   Keith Flores Date of Exam: 04/30/2022 Medical Rec #:  [id number]            Height:       66.0 in Accession #:    [id number]           Weight:       182.8 lb Date of Birth:  [birth year]            BSA:          1.925 m Patient Age:    62 years             BP:           146/88 mmHg Patient Gender: M                    HR:           77 bpm. 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 Coronary atherosclerosis of native coronary                 artery, Prior CABG, Arrythmias:Atrial Fibrillation and                  Palpitations; Risk Factors:Hypertension, Diabetes and                 Dyslipidemia.  Sonographer:    Louie Boston RDCS Referring Phys: [phone] Devoria Albe DICK IMPRESSIONS  1. Left ventricular ejection fraction, by estimation, is 60 to 65%. The left ventricle has normal function. The left ventricle has no regional wall motion abnormalities. Left ventricular diastolic parameters are consistent with Grade I diastolic dysfunction (impaired relaxation). GLS -12.1%  2. Right ventricular systolic function is normal. The right ventricular size is normal. There is normal pulmonary artery systolic pressure.  3. The mitral valve is normal in structure. No evidence of mitral valve regurgitation. No evidence of mitral stenosis.  4. The aortic valve is normal in structure. Aortic valve regurgitation is not visualized. No aortic stenosis is present.  5. The inferior vena cava is normal in size with greater than 50% respiratory variability, suggesting right atrial pressure of 3 mmHg. FINDINGS  Left Ventricle: Left ventricular ejection fraction, by estimation, is 60 to 65%. The left ventricle has normal function. The left ventricle has no regional wall motion abnormalities. Definity contrast agent was given IV to delineate the left ventricular  endocardial borders. The left ventricular internal cavity size was normal in size. There  is no left ventricular hypertrophy. Left ventricular diastolic parameters are consistent with Grade I diastolic dysfunction (impaired relaxation). Right Ventricle: The right ventricular size is normal. No increase in right ventricular wall thickness. Right ventricular systolic function is normal. There is normal pulmonary artery systolic pressure. The tricuspid regurgitant velocity is 1.78 m/s, and  with an assumed right atrial pressure of 3 mmHg, the estimated right ventricular systolic pressure is 15.7 mmHg. Left Atrium: Left atrial size was normal in size. Right Atrium: Right atrial size was normal in  size. Pericardium: There is no evidence of pericardial effusion. Mitral Valve: The mitral valve is normal in structure. No evidence of mitral valve regurgitation. No evidence of mitral valve stenosis. Tricuspid Valve: The tricuspid valve is normal in structure. Tricuspid valve regurgitation is not demonstrated. No evidence of tricuspid stenosis. Aortic Valve: The aortic valve is normal in structure. Aortic valve regurgitation is not visualized. No aortic stenosis is present. Pulmonic Valve: The pulmonic valve was normal in structure. Pulmonic valve regurgitation is not visualized. No evidence of pulmonic stenosis. Aorta: The aortic root is normal in size and structure. Venous: The inferior vena cava is normal in size with greater than 50% respiratory variability, suggesting right atrial pressure of 3 mmHg. IAS/Shunts: No atrial level shunt detected by color flow Doppler.  LEFT VENTRICLE PLAX 2D LVIDd:         3.90 cm   Diastology LVIDs:         2.80 cm   LV e' medial:    5.55 cm/s LV PW:         1.10 cm   LV E/e' medial:  14.2 LV IVS:        1.20 cm   LV e' lateral:   10.20 cm/s LVOT diam:     2.00 cm   LV E/e' lateral: 7.7 LV SV:         67 LV SV Index:   35 LVOT Area:     3.14 cm  RIGHT VENTRICLE             IVC RV Basal diam:  2.70 cm     IVC diam: 1.40 cm RV S prime:     10.20 cm/s TAPSE (M-mode): 1.5 cm LEFT ATRIUM             Index        RIGHT ATRIUM           Index LA diam:        2.60 cm 1.35 cm/m   RA Area:     12.50 cm LA Vol (A2C):   35.4 ml 18.39 ml/m  RA Volume:   26.50 ml  13.77 ml/m LA Vol (A4C):   28.4 ml 14.75 ml/m LA Biplane Vol: 32.3 ml 16.78 ml/m  AORTIC VALVE LVOT Vmax:   103.50 cm/s LVOT Vmean:  71.000 cm/s LVOT VTI:    0.214 m  AORTA Ao Root diam: 3.50 cm Ao Asc diam:  3.70 cm Ao Desc diam: 2.00 cm MV E velocity: 78.60 cm/s  TRICUSPID VALVE MV A velocity: 87.00 cm/s  TR Peak grad:   12.7 mmHg MV E/A ratio:  0.90        TR Vmax:        178.00 cm/s                             SHUNTS  Systemic VTI:  0.21 m                            Systemic Diam: 2.00 cm Belva Crome MD Electronically signed by Belva Crome MD Signature Date/Time: 04/30/2022/4:12:21 PM    Final     Disposition   Pt is being discharged home today in good condition.  Follow-up Plans & Appointments     Follow-up Information     Sharlene Dory, PA-C Follow up on 05/16/2022.   Specialty: Cardiology Why: at 2:45pm for your follow up appt with Dr. Waverly Ferrari' PA [facility] Contact information: [address] [place] [address] [phone]                Discharge Instructions     Amb Referral to Cardiac Rehabilitation   Complete by: As directed    Diagnosis: Coronary Stents   After initial evaluation and assessments completed: Virtual Based Care may be provided alone or in conjunction with Phase 2 Cardiac Rehab based on patient barriers.: Yes   Intensive Cardiac Rehabilitation (ICR) [facility] location only OR Traditional Cardiac Rehabilitation (TCR) *If criteria for ICR are not met will enroll in TCR [facility] only): Yes        Discharge Medications   Allergies as of 05/08/2022   No Known Allergies      Medication List     TAKE these medications    amLODipine 10 MG tablet Commonly known as: NORVASC TAKE 1 TABLET BY MOUTH ONCE DAILY   aspirin EC 81 MG tablet Take 81 mg by mouth daily. Swallow whole.   carvedilol 25 MG tablet Commonly known as: Coreg Take 1 tablet (25 mg total) by mouth 2 (two) times daily with a meal.   clopidogrel 75 MG tablet Commonly known as: PLAVIX Take 1 tablet (75 mg total) by mouth daily.   enalapril 20 MG tablet Commonly known as: VASOTEC Take 1 tablet (20 mg total) by mouth 2 (two) times daily.   glimepiride 4 MG tablet Commonly known as: AMARYL Take 4 mg by mouth daily with breakfast.   hydrochlorothiazide 12.5 MG tablet Commonly known as: HYDRODIURIL Take 12.5 mg by mouth daily.   metFORMIN 1000 MG  tablet Commonly known as: GLUCOPHAGE Take 1 tablet (1,000 mg total) by mouth 2 (two) times daily with a meal. Start taking on: May 11, 2022 What changed: These instructions start on May 11, 2022. If you are unsure what to do until then, ask your doctor or other care provider.   nitroGLYCERIN 0.4 MG SL tablet Commonly known as: NITROSTAT Place 1 tablet (0.4 mg total) under the tongue every 5 (five) minutes as needed. For chest pain   rosuvastatin 40 MG tablet Commonly known as: CRESTOR Take 1 tablet (40 mg total) by mouth daily.   tamsulosin 0.4 MG Caps capsule Commonly known as: FLOMAX Take 0.4 mg by mouth daily. Patient report   traMADol 50 MG tablet Commonly known as: ULTRAM Take 1 tablet (50 mg total) by mouth every 6 (six) hours as needed.        Allergies No Known Allergies  Outstanding Labs/Studies   N/a   Duration of Discharge Encounter   Greater than 30 minutes including physician time.  Signed, Laverda Page, NP 05/08/2022, 2:59 PM  I have examined the patient and reviewed assessment and plan and discussed with patient.  Agree with above as stated.    S/p 2 vessel PCI.  TOlerated  procedure well.  COntinue DAPT with aggressive secondary prevention.  Plan for same day discharge.   Lance Muss

## 2022-05-08 NOTE — Progress Notes (Signed)
Pt educated on PCI, ASA and Plavix, restrictions, HH diet, importance of exercise, diabetic diet, and CRPII. Pt voices he is not interested in CRPII at this time. Referral placed at [facility] to cover metric.   Used [facility] translator.

## 2022-05-09 ENCOUNTER — Encounter (HOSPITAL_COMMUNITY): Payer: Self-pay | Admitting: Interventional Cardiology

## 2022-05-09 NOTE — Interval H&P Note (Signed)
History and Physical Interval Note: Cath Lab Visit (complete for each Cath Lab visit)  Clinical Evaluation Leading to the Procedure:   ACS: No  Non-ACS:    Anginal Classification: CCS III  Anti-ischemic medical therapy: Maximal Therapy (2 or more classes of medications)  Non-Invasive Test Results: No non-invasive testing performed  Prior CABG: Previous CABG       05/09/2022 8:32 AM  Keith Flores  has presented today for surgery, with the diagnosis of chest pain - abnormal stress test.  The various methods of treatment have been discussed with the patient and family. After consideration of risks, benefits and other options for treatment, the patient has consented to  Procedure(s): LEFT HEART CATH AND CORS/GRAFTS ANGIOGRAPHY (N/A) CORONARY STENT INTERVENTION (N/A) CORONARY BALLOON ANGIOPLASTY (N/A) as a surgical intervention.  The patient's history has been reviewed, patient examined, no change in status, stable for surgery.  I have reviewed the patient's chart and labs.  Questions were answered to the patient's satisfaction.     Lance Muss

## 2022-05-13 ENCOUNTER — Ambulatory Visit: Payer: Medicaid Other | Admitting: Nurse Practitioner

## 2022-05-14 ENCOUNTER — Other Ambulatory Visit: Payer: Self-pay

## 2022-05-14 DIAGNOSIS — D751 Secondary polycythemia: Secondary | ICD-10-CM

## 2022-05-15 ENCOUNTER — Inpatient Hospital Stay: Payer: BLUE CROSS/BLUE SHIELD | Attending: Hematology

## 2022-05-15 ENCOUNTER — Inpatient Hospital Stay: Payer: BLUE CROSS/BLUE SHIELD

## 2022-05-15 ENCOUNTER — Other Ambulatory Visit: Payer: Self-pay | Admitting: Hematology

## 2022-05-15 ENCOUNTER — Other Ambulatory Visit: Payer: Self-pay

## 2022-05-15 VITALS — BP 127/67 | HR 85 | Temp 97.7°F | Resp 16

## 2022-05-15 DIAGNOSIS — D751 Secondary polycythemia: Secondary | ICD-10-CM | POA: Diagnosis present

## 2022-05-15 LAB — CBC WITH DIFFERENTIAL (CANCER CENTER ONLY)
Abs Immature Granulocytes: 0.02 10*3/uL (ref 0.00–0.07)
Basophils Absolute: 0.1 10*3/uL (ref 0.0–0.1)
Basophils Relative: 1 %
Eosinophils Absolute: 0.3 10*3/uL (ref 0.0–0.5)
Eosinophils Relative: 5 %
HCT: 47.5 % (ref 39.0–52.0)
Hemoglobin: 15.5 g/dL (ref 13.0–17.0)
Immature Granulocytes: 0 %
Lymphocytes Relative: 31 %
Lymphs Abs: 2 10*3/uL (ref 0.7–4.0)
MCH: 25.5 pg — ABNORMAL LOW (ref 26.0–34.0)
MCHC: 32.6 g/dL (ref 30.0–36.0)
MCV: 78.3 fL — ABNORMAL LOW (ref 80.0–100.0)
Monocytes Absolute: 0.4 10*3/uL (ref 0.1–1.0)
Monocytes Relative: 7 %
Neutro Abs: 3.5 10*3/uL (ref 1.7–7.7)
Neutrophils Relative %: 56 %
Platelet Count: 249 10*3/uL (ref 150–400)
RBC: 6.07 MIL/uL — ABNORMAL HIGH (ref 4.22–5.81)
RDW: 15.1 % (ref 11.5–15.5)
WBC Count: 6.3 10*3/uL (ref 4.0–10.5)
nRBC: 0 % (ref 0.0–0.2)

## 2022-05-15 NOTE — Progress Notes (Signed)
Patient arrived for his phlebotomy today and he met the >45% hemacrit parameter in Dr. Latanya Maudlin note. VS were good and patient has no physical complaints. Phlebotomy started in his R upper FA with an 18g needle at 1507 and ended having removed 510gm at 1518. Patient tolerated well. VSS-   BP 127/67 (BP Location: Left Arm, Patient Position: Sitting)   Pulse 85   Temp 97.7 F (36.5 C) (Oral)   Resp 16   SpO2 98%   given apple juice and pretzels per his request. Patient chose to stay for 20 minutes of observation.  Ambulatory to the lobby.

## 2022-05-15 NOTE — Patient Instructions (Signed)

## 2022-05-16 ENCOUNTER — Ambulatory Visit: Payer: BLUE CROSS/BLUE SHIELD | Attending: Physician Assistant | Admitting: Physician Assistant

## 2022-05-16 ENCOUNTER — Encounter: Payer: Self-pay | Admitting: Physician Assistant

## 2022-05-16 VITALS — BP 110/70 | HR 92 | Ht 66.0 in | Wt 182.2 lb

## 2022-05-16 DIAGNOSIS — E119 Type 2 diabetes mellitus without complications: Secondary | ICD-10-CM | POA: Diagnosis not present

## 2022-05-16 DIAGNOSIS — E782 Mixed hyperlipidemia: Secondary | ICD-10-CM

## 2022-05-16 DIAGNOSIS — Z720 Tobacco use: Secondary | ICD-10-CM | POA: Diagnosis not present

## 2022-05-16 DIAGNOSIS — I10 Essential (primary) hypertension: Secondary | ICD-10-CM

## 2022-05-16 DIAGNOSIS — Z955 Presence of coronary angioplasty implant and graft: Secondary | ICD-10-CM

## 2022-05-16 NOTE — Patient Instructions (Signed)
Medication Instructions:  Your physician recommends that you continue on your current medications as directed. Please refer to the Current Medication list given to you today.  *If you need a refill on your cardiac medications before your next appointment, please call your pharmacy*  Lab Work: None If you have labs (blood work) drawn today and your tests are completely normal, you will receive your results only by: MyChart Message (if you have MyChart) OR A paper copy in the mail If you have any lab test that is abnormal or we need to change your treatment, we will call you to review the results.  Follow-Up: At [facility], you and your health needs are our priority.  As part of our continuing mission to provide you with exceptional heart care, we have created designated Provider Care Teams.  These Care Teams include your primary Cardiologist (physician) and Advanced Practice Providers (APPs -  Physician Assistants and Nurse Practitioners) who all work together to provide you with the care you need, when you need it.  We recommend signing up for the patient portal called "MyChart".  Sign up information is provided on this After Visit Summary.  MyChart is used to connect with patients for Virtual Visits (Telemedicine).  Patients are able to view lab/test results, encounter notes, upcoming appointments, etc.  Non-urgent messages can be sent to your provider as well.   To learn more about what you can do with MyChart, go to [email or web address].    Your next appointment:   3-4 month(s) or next available  Provider:   Lance Muss, MD    Other Instructions Check your blood pressure daily, 1-2 hrs after taking morning medications for 2 weeks, keep a log and call us with the readings at the end of 2 weeks Weigh daily after using the restroom, before breakfast and let us know if you have a weight gain of 2 lbs or more overnight or 5 lbs or more in a week  ??? ??????? ??????  ???????? Low-Sodium Eating Plan ?????? ????????? ??? ?????? ??????? ??????? ??? ?????? ??? ????? ??? ??????? ?? ?????. ??? ??? ?? ??????? ?? ???????? ????? ?? ???? ??? ?????? ??? ???? ??????? ???????? ???????? ?? ??????. ??? ?? ????? ????? ??????? ?????? ?? ?????? ??????? ????? ?? ?????? ??? ????? ??? ??? ????? ?? ?????? ??? ???? (??? ?????) ?? ??? ?????? ?? ????? ?? ?????. ???? ?? ????? ??????? ?? ???? ???????? ????????? ??? ????? ??? ???? ?????? ?????? ?????? ????? ?????? ??????. ?? ??????? ??????? ?????? ??? ?????? ????? ?????? ????????? ????????  ????? ?????? ?????? ??????? ???? ???????? ?? ??? ????? ?? ??????. ???? ???? ???? ?? ???? ???? ????? ???? ??? ???? ???????? ??????? ?? ??? ??????.  ???? ??????? ???? ??? ???? ???????? ??? ?? 140 ???? ?? ????? ???????.  ???? ??????? ???? ????? ??? 300 ???? ?? ???? ?? ???????? ?? ????? ???????. ??????   ????? ???????? ?????? ????????? ??????? ?? ???? ???? ????? ????? "???? ????????" ?? "??? ??? ?????".  ???? ????? ?? ????? ???????? ??? ?? ??????? ??????? ????? "??? ???" ?? "??? ??? ?????".  ????? ??????? ???????.  ? ???? ??????? ??????? ????????? ?????? ?? ??????? ????????. ? ???? ?????? ??????? ?? ???????? ?? ???????/ ???????.  ????? ????? ????? ???? ????? ??? ??? ?? 80 ???? ?? ???????? ??? ?????. ?????   ???? ?? ????? ??????? ???????? ???? ?? ????? ????? ??????? ???????? ???????? ???????.  ???? ????? ????? ??? ?????. ?????? ??????? ?? ??????? ??????? ?? ?????? ????? ?? ??? ?????? ?? ??? ??????. ???? ???? ??????? ?????? ?? ??????? ??? ??????? ????? ?????.  ?????? ?????? ???????? ??? ?????? ??? ??? ???????? ?? ???? ????? ?? ???????. ????? ???????  ??? ????? ?????? ?? ??? ???????? ???? ???? ????? ????? ???? ??? ?? ??? ??? ?? ????. ???? ??????? ??????? ????? ????? ?? ?????? ?? ????? ?? ?????? ?? ???? ?????? ?? ???? ?? ???? ??????.  ???? ??????? ???? ????? ???  MSG (??????????? ??????????? ????????????). ???? ????? ??????????? ??????????? ????????????  ??? ?????? ?????? ?????? ???? ????????? ???????.  ??? ????? ???? ????? ?? ????? ?? ????? ?? ??????? ?? ????? ??? ??????. ???? ??? ??????? ?????? ???? ??? ?? ????? ??? ?? ????????. ??????? ???? ????? ???? ??????? ???????? ???? ?????? ?????? ???? ???????? ????????? ?????? ??? 1500-2000 ???? (????????). ?? ??????? ???? ??? ?? ????????? ??????? ??????? ??????? ?? ??????? ?? ???????. ???? ???????. ????????? ????????? ??????? ?? ???????. ????????? ??????? "??? ??? ?????". ????? ????? ??????? "??? ??? ?????". ????? ????????? ???????? ?????? ???????? ?? ????? ????????. ?????? ???? ??????? ?????? ????????? ??? ?? ??? ??????? ?????? ??????? ?????? ????? ????? ???????. ??????? ????? ????????. ????? ??? ??????. ????????? ??? ???????. ????? ???? ????????. ????????? ?????? ????? ??????? ?? ?????? ???????. ?????? ??????????? ?????? ??????? ?????????? ??????? ???????? ??????? ??????? ?? ??????? "??? ??? ?????". ???????? ??????? ??????? ?????? ????????. ???????? ??? ???????. ????????? ????????? ??????? ?????? ??? ????? ?????. ????? ?????? ?????? ?? ?????. ?????. ???? ???????? ??????? ?? ?????. ?????? ??????? ??????. ???? ??????. ????? ?????? ???????? ????? ????????? ??? ???????? ?? ?????????? ??????? ?? ?????????. ????? ????? ???????? ?? ???? ????????. ????? ?????. ????? ??????. ??????? ????????? ??????? ???????? ?????? ????????. ???????? ??????? ?? ?????. ????? ???????? ????????? ????? ????????. ?????? ?????? ??????? ?? ????????. ????????? ?????? ?? ????? ??????? ?? ????????. ????? ????? ?????? ?? ??????. ???? ???????. ????. ??????? ?????? ????? ?????? ??????? ?? ?????? ?????? ???????? ?? ????? ????????. ?????? ?????? ????? ??? ??????. ??????? ??????? ?? ????? ?? ????? ?????. ???? ?? ???? ??????? ??????? ????? ??????? ??????? ?? ????????? ?????????? ???? ????? ???????. ??? ????? ??????? ??????? ?????? ?????? ?? ?????????. ?? ??????? ?????? ??????? ????????? ?????? ?????? ????????? ????????. ???????. ???????  ???????. ????? ?????. ????????? ??????? ??????? (??? ?????? ???????? ?? ??? ????? ????????). ????? ????? ????? ??????? ??????? (??? ?????? ???????? ?? ??? ????? ????????). ????? ????????? ???????? ??????? (??? ?????? ???????? ?? ??? ????? ????????). ????????? ??????? ?? ?????. ?????? ?????? ?????? ????? ???????. ??? ????? ??????? ????? ????????. ????? ??????. ???? ????????? ?? ????? ??????. ???? ?????????. ????? ?????????? ??????? ?? ???????. ????? ???????? ??????? ??????? ???????. ?????? ???? ????????. ?????? ??????????? ?????? ??????? ?? ????????? ??????? ?? ?????? ??????? ?? ??????? ?? ??????? ?? ??????? ?? ???????. ?????? ????? ??????? ?? ?????? ??? ??????? ?? ????? ?????. ??? ???????. ??? ????? ?????? ?????. ???????. ????? ???. ??? ???? ????. ??? ???? ?????. ??? ??????? ??????. ????? ??????. ?????? ???????. ???????? ????????. ?????? ??????? ???????. ???????? ???????. ?????? ??????? ????? ??????? ????????. ????? ?????. ????? ?????. ?????? ???????. ???? ??????. ????? ??????. ?????? ?????? ???????. ????? ??????. ?????? ??????. ?????? ??????? ?????? ???????. ????? ????? ???????. ????? ????????. ???? ??? ???????. ??????? ????????? ??? ????? ?????? ?????? ?????? ???? ??????? ???? ?????. ??? ????? ?????? ???????. ???? ???????. ???? ????????. ???? ??????. ???? ????????. ???? ??????? ????? ?????? ????? ????????. ???? ????? ?????. ???? ?????. ???? ??????. ???? ????????. ????? ????? ??????. ???????? ???????? ??????. ????? ??????? ??????. ?????? ??????. ?????? ??????. ????? ??????? ??????? ?????? ?? ???????. ??????? ?????? ??????? ?????? ?? ???????. ???????. ???? ?????? ???????. ??????. ??????? ?????? ?????? ?????? ????? ??????. ????? ????? ???????? ????????. ????? ??????? ??????????. ?????? ?????? ?? ??????. ???????. ????? ????? ????????? ???????. ???? ?? ???? ??????? ??????? ????? ??????? ??????? ?? ????????? ?????????? ???? ????? ???? ??????. ??? ????? ??????? ??????? ?????? ?????? ??  ?????????. ????  ???? ?? ????? ??????? ?? ???? ???????? ????????? ??? ????? ??? ???? ?????? ?????? ?????? ????? ?????? ??????.  ????? ???? ??????? ???????? ???? ?????? ?????? ???? ???????? ????????? ?????? ???  1500-2000 ???? (????????).  ????? ??????? ??????? ?? ??????? ??? ??? ?????? ?? ????????. ?????? ????? ??????? ???????? ??????? ???????? ??? ??? ????? ?? ????????. ??? ???? ??? ???????? ?????? ????? ??? ??????.  ???? ??? ????? ???????? ?????? ?????? ?? ??????? ?????????? ??????? ???????? ?? ????? ??????? ??????? ???????? ??????? ?? ???????? ?? ????????. ??? ????? ?? ??? ????????? ?? ???? ?????? ????????? ???? ?????? ???? ??????? ??????. ???? ?? ?????? ??? ????? ???? ?? ???? ?? ???? ??????? ??????.? Document Revised: 02/11/2019 Document Reviewed: 02/03/2019 Elsevier Patient Education  2023 [employer]   ??? ????? ???? ??? ????? Heart-Healthy Eating Plan ??? ????? ????? ????? ????? ??? ???????? ???????? ????? ?????? ?????? ????? ????? ????? ?????? ??????? ???????? ????????. ?????? ??? ????? ????? ?????? ???????. ?????? ??? ??? ????? ??? ?????? ??????? ?????? ?? ???? ?? ???? ???????. ???? ????? ?????? ?????? ????? ???? ???? ?? ?????? ??? ??????? ?????? ?????? ??????? ?????? ????? (????????) ?? ??????? ??????????? ?????? ??????? ???? ????? ??????? ?????? ???????. ?? ?? ????? ?? ????? ????? ??????? ?????? ??????? ?? ??? ???:  ????? ?? ??????? ?? ?????? ????? _________% ?? ??? ?? ?????? ??????? ???????? ???? ???????? ??????.  ????? ?? ??????? ?? ?????? ??????? ????? _________% ?? ??? ?? ?????? ??????? ???????? ???? ???????? ??????.  ????? ???? ??????????? ???? ???? ????? ?? ??????? ?????????? ???? ?? _______ ??? ?? ?????.  ????? ???? ???????? ???? ???? ????? ?? ??????? ?????????? ???? ?? _______ ??? ?? ?????. ?? ??????? ???? ?????? ??? ????? ??? ?????? ????? ???? ??? ?????? ???????? ????? ???? ??? ?????. ????? ?????? ????? ?????? ?? ???????? ??????. ??? ????? ?????? ?????? ??????:  ?????  ????? ?? ???????.  ????? ???? ?????? ??????? ?? ??????.  ??? ?????? ?? ??? ?? ???. ????? ???????   ?? ????? ??????? ???? ????? ????? ??? ???? ???: ? ???? ??? ???? ?????????? ??????? ???????. ? ???? ??? ???? ??????? ???????. ? ???? ??? ???? ???????? ??? ?????????? ??????? ?? ??????.  ????? ????? ???? ??? 4 ????? ?? ????????? ??????. ?????? ?????? ?? ???????? ????? ????? ?????? (91 ??) ?? ???????? ?? ???? ????????? ?? ?????? ??????? ?????? ?? ???? ???? ??? ?????? ?? ???? ????? ???? ?????? ?? ???? ????? ?????? ?? ???? ????? ????? ??????? ?? ????? (150 ??) ?? ???????? ??????? ??????.  ????? ?? ??? ???? ??? ????? ???? ?? ??????? ??????. ?????? ?????? ?? ??????? ????? ????? ?????? ?? ???? ?????? ?? ??? ???? (237 ??) ?? ??????? ???????? ?? ??????? ?????? ?? ??? ??? (82 ??) ?? ??????? ???????? ?? ??? ???? (240 ??) ?? ???? ??????? ??????.  ????? ?????? ?? ??????? ???????? ??? ????? ????? ???????. ??? ??? ??????? ?????? ????????? ?????? ????????? ???????. ???? ???? ?????? ??? 25-30 ?? ?? ??????? ??????.  ??? ???????? ?? ????????? ????????? ??????? ??? 4-5 ??? ?? ???????. ?????? ????? ??????? ?? ????????? ??????? ?? ????????? ??? ??? (90 ??) ?? ??? ??????? ?????? ?????? ??????? ?? ???????? ????? ??? ????? (12 ??? ???? ?? 24 ??? ????? ?? 7 ????? ?? ?????)? ?????? ??????? ?? ?????? ????? ??? ????? (8 ??). ??????  ???? ?? ?????? ?????? ??????. ???? ?????? ???????? ????????? ??? ???????? ??? ??? ??????? ???? ???????? ???? ????????? ????? ?????? ?????? ?????? ???????.  ????? ?????? ?? ???? ??????-3. ??????? ?????? ????? ???????? ????????? ???????? ??????? ???? ??? ?????? ???? ?????? ???????. ???? ????? ????? ????? ????? ??? ????? ????????.  ???? ?????? ??????? ???????? ?????? ?????? ??????? ???? ????? ??? ???? ?????? ?? ????? ????? ?? ?????? ???????.  ??? ?? ?????? ?????? ???????. ???? ?????? ??????? ?? ???????? ?????????? ??? ?????? ?????? ???????. ????? ??????? ???????? ?????? ??????? ??? ?????? ???? ?? ??????  ???? ??? ?????.  ???? ??????? ???????? ??? ???? ?????? ??????. ??? ???? ????? ??? ???? ??????. ??? ??????? ??? ??? ???????: ??? ?????????? ???? ??????? ?? ???????? ????????? ?????????? ?????? ?? ?????????.  ???? ??????? ???????. ??????? ????  ???? ?? ????? ??????? ???????? ???? ?? ????? ????? ??????? ???????? ???????? ???????.  ??? ?? ????? ????????? ?? ???? ???? ??????.  ??? ?? ??????? ?????? ????? ?????? ?? ????? ????????? ??????? ??? ??????? ???????? ???????? ?????? ?????? ?????? (????? ?????? ????????? ?????????).  ???? ????? ??? ??? ??????. ???? ???? ?????  5-10% ??? ???? ?? ????? ?? ????? ???? ?????? ??????? ?? ?????? ?? ??????? ??? ?????? ?????? ?????.  ????? ?????? ???????? ?? ????? ??????? ????? ??? ??? ????? ?? ?????? ??? ????. ????? ??????? ??????? ?????? ??????? ?? ???? ?? ??????? ?? ????????.  ???? ????? ?? ??????? ?? ????? ?????? ?? ?? ?????. ?? ??????? ???? ??? ?? ????????? ??????? ???? ????? ??????? ??????? ?? ??????? (?? ???? ?????) ?? ???????. ????????? ???????? ??????? ?? ??????? (?????? ?? ??????? ?? ??????? ?? ???????). ???????? ???????. ?????? ???? ??????. ?? ?????? ????? ?????? ??????? ??????? ?? ???? ?????. ????? ??????????? ??? ?? ??? ????? ????? ?????????? ???????? ?? ????? ??????. ?????? ??????????? ?????? ????? ?????? ???? ????? ???? ??????? ??????? ????? ??????. ???? ?????? ?????? ?????? ?????? ?????. ???? ????? ????? ???????. ???? ????? ???????? ????? ?????? ???? ??????. ???? ????? ?? ????? ????? ?????? ???????????. ?????? ??????? ????????? ?????? ???????. ?????? ????? ????????. ?????? ??????? ?????? ????? ????? ?? ?????? ?????? ??? ?? ??? ??? ???????? ???????????. ?????? ??????? ?? ???? ????? 1% (???? ??? ?????? ?? ??????? ?? ?????? ??? ????? ???). ??? ???? ????? ?? ?????? ???? ?????. ????? ?????? ???? ????? ?? ???? ?????. ?????? ??????? ????????? (????? ???????) ??? ??????? (???? ?? ?????? ????????). ?????? ????????? ????? ??? ?????? ??????? ????? ????? ????????  ?????????? ?????? ???????? ??????? ?????? ????????? ????? ?????. ??? ??????? ?? ????????? ????? ???? ?????. ????????? ?????? (???????? ?? ???????). ?????? ??????. ????? ?????? ??? ??????. ??????? ?????? ????????. ???????? ??????? ???? ?????? ?????????? ??????? ??????? ???????. ????? ????? ?? ?????????? ???????. ??? ?? ??????? ?? ???????? ????. ??????? ????????? ???? ??????? ?????????. ???? ?? ???? ??????? ??????? ????? ??????? ??????? ?? ????????? ?????????? ???? ????? ???????. ????? ??????? ????? ?????? ?????? ?? ????????. ?? ??????? ?????? ??????? ??????? ??????? ??????? ?? ???? ????. ??????? ????? ?????? ?? ???????. ??????? ???????. ??? ?? ??? ?????. ????????? ????????? ???????? ?? ????? ?? ??????? ?? ???? ??????. ????????? ???????. ?????? ????? ????? ???????? ?????? ??????? ?? ????????? ?? ?????? ?? ?????? ??????. ?????????. ??????? ??????. ???????. ????????? ????? ????? ??? ????? ?????? ????? ?????? ???????. ?????? ??????????? ?????? ?????? ??????? ??? ????? ???? ????????? ??????? ???? ??????? ??????? ???? ????? ?????? ?? ????? ?????? ????. ?????? ??????? ????? ?????? ??? ??????? ??????????. ????????. ???? ?????? ????????. ????? ????????? ??? ?????. ?????? ??????? ?????? ??????? ??????? ??????? ??????? ? ??? ???? ???????. ??????? ???????? ?? ???? ???? ?????. ???? ???? ????? ?? ???? 2% (?????? ?? ???????? ?? ????? ??? ?? ?????). ??? ?????? ???? ?????. ????? ??????? ?? ??? ????? ???? ?????. ??????? ??????? ?? ????? ???? ?????. ?????? ??????? ??? ????? ?? ????????? ??????????? (????????). ???? ??????? ??????? ???????? ???? ?????? ? ??? ??? ????? ???? ???? ??????. ?????? ?????? ??????? ??? ?? ??? ???? ??? ??????? ?????? ???? ??????? ?????? ???? ??????? ???????. ????? ?????? ??? ???????? ?? ?????. ??? ?????? ??????? ?? ????? ?? ??????? ???????. ????????? ??????? ?????? ??????? ??? ??????? ????? ??? ?????? ?????. ???????? ??????? ?????? ???????. ???????. ????????. ?????. ???????. ??????????  ??????? ?? ?????????? ???????. ?????? ??????? ??????. ??? ???? ???? ????? ?? ??????? ????? ????. ?? ?? ???? ??????? ??????? ????? ??????? ??????? ?? ????????? ?????????? ???? ????? ???? ??????. ??? ????? ??????? ??????? ?????? ?????? ?? ?????????. ????  ??? ????? ?????? ?????? ????? ???? ???? ?? ?????? ??? ??????? ?????? ?????? ??????? ?????? ????? (????????) ?? ??????? ??????????? ?????? ??????? ???? ????? ??????? ?????? ???????.  ???? ????? ??? ??? ??????. ???? ???? ????? 5-10% ??? ???? ?? ????? ?? ????? ???? ?????? ??????? ?? ?????? ?? ??????? ??? ?????? ?????? ?????.  ??? ??? ????? ???? ??????? ??? ????????? ????????? ??????? ??????? ????? ????? ?? ?????? ?????? ??????????? ????? ?????? ????????? ??????????? ??????? ???????? ??????? ??????? ?????? ?????. ??? ????? ?? ??? ????????? ?? ???? ?????? ????????? ???? ?????? ???? ??????? ??????. ???? ?? ?????? ??? ????? ???? ?? ???? ?? ???? ??????? ??????.?   Document Revised: 03/01/2021 Document Reviewed: 03/01/2021 Elsevier Patient Education  2023 [employer].

## 2022-05-16 NOTE — Progress Notes (Signed)
Office Visit    Patient Name: Keith Flores Date of Encounter: 05/16/2022  PCP:  Rometta Emery, MD   [facility]  Cardiologist:  Lance Muss, MD  Advanced Practice Provider:  No care team member to display Electrophysiologist:  None   HPI    Keith Flores is a 62 y.o. male with a past medical history of CAD status post CABG 2010 (LIMA to LAD, SVG to diagonal, SVG to OM, SVG to PDA), PCI with DES x 2 to RCA 02/2011 due to early graft occlusion, hypertension, hyperlipidemia, diabetes, tobacco use presents today for follow-up visit.  Patient was having some exertional chest pain and was set up for outpatient stress test which was high risk suggestive of lateral infarct with small amount of peri-infarct ischemia.  He was seen back in the office on 4/16 and now set up for outpatient cardiac catheterization.  He presented for cardiac catheterization 05/08/22.  He underwent successful PCI of the mid circumflex with DES x 1 and PTCA of the mid LAD.  Recommendations to continue DAPT with aspirin/Plavix for 1 year and long-term Plavix given his diffuse disease.  Patient was seen by cardiac rehab well and short stay.  There were no observed complications post cath.  Left radial cath site was evaluated prior to discharge and found to be stable.  Today, he presents for posthospitalization follow-up.  He is up to walking 30 minutes every day and plans to travel to [place] in a few days.  He has 2 connecting flights and we discussed the importance of walking in between flights as well as wearing lower extremity compression.  He tells me that Dr. Eldridge Dace he knows that he is flying this close to his stent placement.  He plans to take all of his medications with him and not miss any doses.  We discussed diet and also printed his cardiac catheterization for him today.  Reports no shortness of breath nor dyspnea on exertion. Reports no chest pain, pressure, or  tightness. No edema, orthopnea, PND. Reports no palpitations.   Past Medical History    Past Medical History:  Diagnosis Date   Asthma    Chest pain    01/2011 in setting of HTN urgency: Chest CT demonstrated no pulmonary embolus, postoperative appearance following CABG and small left pleural effusion and dependent atelectasis in the left lung   Coronary artery disease    CABG 12/12 (LIMA-LAD, SVG-DX, SVG-OM, SVG-PDA) c/b post op AFib Rx with amio Rx. s/p successful PCI of the right coronary artery utilizing overlapping drug-eluting stents in the mid and distal vessel (done after abnormal nuc)   Diabetes mellitus    Elevated hemoglobin (HCC)    Hyperlipidemia    Hypertension    Past Surgical History:  Procedure Laterality Date   CARDIAC CATHETERIZATION     CORONARY ARTERY BYPASS GRAFT  12/30/2010   Procedure: CORONARY ARTERY BYPASS GRAFTING (CABG);  Surgeon: Delight Ovens, MD;  Location: [facility] OR;  Service: Open Heart Surgery;  Laterality: N/A;   CORONARY BALLOON ANGIOPLASTY N/A 05/08/2022   Procedure: CORONARY BALLOON ANGIOPLASTY;  Surgeon: Corky Crafts, MD;  Location: [facility] INVASIVE CV LAB;  Service: Cardiovascular;  Laterality: N/A;   CORONARY STENT INTERVENTION N/A 05/08/2022   Procedure: CORONARY STENT INTERVENTION;  Surgeon: Corky Crafts, MD;  Location: [facility] INVASIVE CV LAB;  Service: Cardiovascular;  Laterality: N/A;   LEFT HEART CATH AND CORS/GRAFTS ANGIOGRAPHY N/A 05/08/2022   Procedure: LEFT HEART CATH  AND CORS/GRAFTS ANGIOGRAPHY;  Surgeon: Corky Crafts, MD;  Location: [facility] INVASIVE CV LAB;  Service: Cardiovascular;  Laterality: N/A;   LEFT HEART CATHETERIZATION WITH CORONARY ANGIOGRAM N/A 01/12/2013   Procedure: LEFT HEART CATHETERIZATION WITH CORONARY ANGIOGRAM;  Surgeon: Micheline Chapman, MD;  Location: [facility] CATH LAB;  Service: Cardiovascular;  Laterality: N/A;   LEFT HEART CATHETERIZATION WITH CORONARY/GRAFT ANGIOGRAM  03/07/2011   Procedure: LEFT HEART  CATHETERIZATION WITH Isabel Caprice;  Surgeon: [name], MD;  Location: [facility] CATH LAB;  Service: Cardiovascular;;   PERCUTANEOUS CORONARY STENT INTERVENTION (PCI-S)  03/07/2011   Procedure: PERCUTANEOUS CORONARY STENT INTERVENTION (PCI-S);  Surgeon: [name], MD;  Location: [facility] CATH LAB;  Service: Cardiovascular;;    Allergies  No Known Allergies   EKGs/Labs/Other Studies Reviewed:   The following studies were reviewed today: Cardiac Studies & Procedures   CARDIAC CATHETERIZATION  CARDIAC CATHETERIZATION 05/09/2022  Narrative   RPDA lesion is 75% stenosed, noted in the distal vessel.   Prox RCA lesion is 25% stenosed.   SVG to PDA occluded   Prox Cx lesion is 80% stenosed.  A drug-eluting stent was successfully placed using a SYNERGY XD 3.0X20, postdilated to 3.3 mm in diameter.   Post intervention, there is a 0% residual stenosis.   75% mid LAD lesion at the origin of the first diagonal and proximal to the second diagonal which had been bypassed.  The SVG to diagonal was occluded.  Balloon angioplasty of the mid LAD lesion with a 2.5 x 12 Emerge balloon was performed to improve flow to the second diagonal. Mid LAD-2 lesion is 100% stenosed.  LIMA to LAD is widely patent.  Moderate disease in the apical LAD.   Post intervention, there is a 10% residual stenosis.  No visible dissection.   1st Mrg-1 lesion is 100% stenosed.  SVG to marginal is patent.  At the insertion of the graft, there is severe native distal vessel disease.  Not a target for PCI.   Mid LAD-2 lesion is 100% stenosed.  LIMA to LAD is widely patent.  Moderate disease in the apical LAD.   SVG to second diagonal is occluded.   Previously placed Mid RCA to Dist RCA stent of unknown type is  widely patent.   Balloon angioplasty was performed using a BALLN EMERGE MR 2.5X12.   The left ventricular systolic function is normal.   LV end diastolic pressure is normal.   The left ventricular ejection fraction is  55-65% by visual estimate.   There is no aortic valve stenosis.  Successful PCI of the mid circumflex with drug-eluting stent and mid LAD with PTCA.  Continue aggressive secondary prevention.  Continue dual antiplatelet therapy.  I would continue clopidogrel long-term given his diffuse disease.  Plan for same-day discharge.  Results were conveyed to his son.  Findings Coronary Findings Diagnostic  Dominance: Right  Left Anterior Descending There is moderate diffuse disease throughout the vessel. Mid LAD-1 lesion is 90% stenosed. Mid LAD-2 lesion is 100% stenosed.  Left Circumflex Prox Cx lesion is 80% stenosed.  First Obtuse Marginal Branch Collaterals 1st Mrg filled by collaterals from 3rd RPL.  1st Mrg-1 lesion is 100% stenosed. 1st Mrg-2 lesion is 90% stenosed.  Right Coronary Artery Prox RCA lesion is 25% stenosed. Previously placed Mid RCA to Dist RCA stent of unknown type is  widely patent.  Right Posterior Descending Artery RPDA lesion is 75% stenosed.  Graft To RPDA Origin lesion is 100% stenosed.  LIMA Graft To Dist  LAD  Graft To 1st Mrg  Graft To 2nd Diag Origin to Prox Graft lesion is 100% stenosed.  Intervention  Mid LAD-1 lesion Angioplasty CATH LAUNCHER 6FR EBU3.5 guide catheter was inserted. WIRE RUNTHROUGH .[id number] guidewire used to cross lesion. Balloon angioplasty was performed using a BALLN EMERGE MR 2.5X12. Post-Intervention Lesion Assessment The intervention was successful. Pre-interventional TIMI flow is 3. Post-intervention TIMI flow is 3. No complications occurred at this lesion. There is a 10% residual stenosis post intervention.  Prox Cx lesion Stent CATH LAUNCHER 6FR EBU3.5 guide catheter was inserted. Lesion crossed with guidewire using a WIRE RUNTHROUGH .[id number]. Pre-stent angioplasty was performed using a BALLN EMERGE MR 2.5X12. A drug-eluting stent was successfully placed using a SYNERGY XD 3.0X20. Stent strut is well apposed.  Post-stent angioplasty was performed using a BALLN [place] EMERGE MR [id number]. Post-Intervention Lesion Assessment The intervention was successful. Pre-interventional TIMI flow is 3. Post-intervention TIMI flow is 3. No complications occurred at this lesion. There is a 0% residual stenosis post intervention.   STRESS TESTS  MYOCARDIAL PERFUSION IMAGING 05/01/2022  Narrative   Findings are consistent with infarction with peri-infarct ischemia. The study is high risk.   No ST deviation was noted.   Left ventricular function is normal. End diastolic cavity size is normal. End systolic cavity size is normal.   Prior study available for comparison from 01/04/2013. There are changes compared to prior study which appear to be worse.  Study suggestive of lateral infarct with small amount of peri-infarct ischemia. Perfusion is worse from 2014 study. High risk study due to percentage of myocardium involved.   ECHOCARDIOGRAM  ECHOCARDIOGRAM COMPLETE 04/30/2022  Narrative ECHOCARDIOGRAM REPORT    Patient Name:   Keith Flores Date of Exam: 04/30/2022 Medical Rec #:  [id number]            Height:       66.0 in Accession #:    [id number]           Weight:       182.8 lb Date of Birth:  12-12-1960            BSA:          1.925 m Patient Age:    62 years             BP:           146/88 mmHg Patient Gender: M                    HR:           77 bpm. Exam Location:  [facility]  Procedure: 2D Echo, Cardiac Doppler, Color Doppler, Strain Analysis and Intracardiac Opacification Agent  Indications:    Atherosclerosis of native coronary artery of native heart without angina pectoris [I25.10 (ICD-10-CM)], Stable angina pectoris [I20.89 (ICD-10-CM)]  History:        Patient has prior history of Echocardiogram examinations, most recent 03/03/2011. Coronary atherosclerosis of native coronary artery, Prior CABG, Arrythmias:Atrial Fibrillation and Palpitations; Risk Factors:Hypertension, Diabetes  and Dyslipidemia.  Sonographer:    Louie Boston RDCS Referring Phys: [phone] Devoria Albe DICK  IMPRESSIONS   1. Left ventricular ejection fraction, by estimation, is 60 to 65%. The left ventricle has normal function. The left ventricle has no regional wall motion abnormalities. Left ventricular diastolic parameters are consistent with Grade I diastolic dysfunction (impaired relaxation). GLS -12.1% 2. Right ventricular systolic function is normal. The right ventricular size is normal. There is normal pulmonary artery  systolic pressure. 3. The mitral valve is normal in structure. No evidence of mitral valve regurgitation. No evidence of mitral stenosis. 4. The aortic valve is normal in structure. Aortic valve regurgitation is not visualized. No aortic stenosis is present. 5. The inferior vena cava is normal in size with greater than 50% respiratory variability, suggesting right atrial pressure of 3 mmHg.  FINDINGS Left Ventricle: Left ventricular ejection fraction, by estimation, is 60 to 65%. The left ventricle has normal function. The left ventricle has no regional wall motion abnormalities. Definity contrast agent was given IV to delineate the left ventricular endocardial borders. The left ventricular internal cavity size was normal in size. There is no left ventricular hypertrophy. Left ventricular diastolic parameters are consistent with Grade I diastolic dysfunction (impaired relaxation).  Right Ventricle: The right ventricular size is normal. No increase in right ventricular wall thickness. Right ventricular systolic function is normal. There is normal pulmonary artery systolic pressure. The tricuspid regurgitant velocity is 1.78 m/s, and with an assumed right atrial pressure of 3 mmHg, the estimated right ventricular systolic pressure is 15.7 mmHg.  Left Atrium: Left atrial size was normal in size.  Right Atrium: Right atrial size was normal in size.  Pericardium: There is no evidence  of pericardial effusion.  Mitral Valve: The mitral valve is normal in structure. No evidence of mitral valve regurgitation. No evidence of mitral valve stenosis.  Tricuspid Valve: The tricuspid valve is normal in structure. Tricuspid valve regurgitation is not demonstrated. No evidence of tricuspid stenosis.  Aortic Valve: The aortic valve is normal in structure. Aortic valve regurgitation is not visualized. No aortic stenosis is present.  Pulmonic Valve: The pulmonic valve was normal in structure. Pulmonic valve regurgitation is not visualized. No evidence of pulmonic stenosis.  Aorta: The aortic root is normal in size and structure.  Venous: The inferior vena cava is normal in size with greater than 50% respiratory variability, suggesting right atrial pressure of 3 mmHg.  IAS/Shunts: No atrial level shunt detected by color flow Doppler.   LEFT VENTRICLE PLAX 2D LVIDd:         3.90 cm   Diastology LVIDs:         2.80 cm   LV e' medial:    5.55 cm/s LV PW:         1.10 cm   LV E/e' medial:  14.2 LV IVS:        1.20 cm   LV e' lateral:   10.20 cm/s LVOT diam:     2.00 cm   LV E/e' lateral: 7.7 LV SV:         67 LV SV Index:   35 LVOT Area:     3.14 cm   RIGHT VENTRICLE             IVC RV Basal diam:  2.70 cm     IVC diam: 1.40 cm RV S prime:     10.20 cm/s TAPSE (M-mode): 1.5 cm  LEFT ATRIUM             Index        RIGHT ATRIUM           Index LA diam:        2.60 cm 1.35 cm/m   RA Area:     12.50 cm LA Vol (A2C):   35.4 ml 18.39 ml/m  RA Volume:   26.50 ml  13.77 ml/m LA Vol (A4C):   28.4 ml 14.75 ml/m LA Biplane  Vol: 32.3 ml 16.78 ml/m AORTIC VALVE LVOT Vmax:   103.50 cm/s LVOT Vmean:  71.000 cm/s LVOT VTI:    0.214 m  AORTA Ao Root diam: 3.50 cm Ao Asc diam:  3.70 cm Ao Desc diam: 2.00 cm  MV E velocity: 78.60 cm/s  TRICUSPID VALVE MV A velocity: 87.00 cm/s  TR Peak grad:   12.7 mmHg MV E/A ratio:  0.90        TR Vmax:        178.00  cm/s  SHUNTS Systemic VTI:  0.21 m Systemic Diam: 2.00 cm  Belva Crome MD Electronically signed by Belva Crome MD Signature Date/Time: 04/30/2022/4:12:21 PM    Final              EKG:  EKG is not ordered today.    Recent Labs: 04/25/2022: ALT 18 05/06/2022: BUN 19; Creatinine, Ser 0.94; Potassium 4.0; Sodium 140 05/15/2022: Hemoglobin 15.5; Platelet Count 249  Recent Lipid Panel    Component Value Date/Time   CHOL 98 (L) 04/25/2022 1511   TRIG 73 04/25/2022 1511   HDL 36 (L) 04/25/2022 1511   CHOLHDL 2.7 04/25/2022 1511   CHOLHDL 3.4 04/05/2015 0852   VLDL 16 04/05/2015 0852   LDLCALC 47 04/25/2022 1511    Home Medications   Current Meds  Medication Sig   amLODipine (NORVASC) 10 MG tablet TAKE 1 TABLET BY MOUTH ONCE DAILY   aspirin EC 81 MG tablet Take 81 mg by mouth daily. Swallow whole.   carvedilol (COREG) 25 MG tablet Take 1 tablet (25 mg total) by mouth 2 (two) times daily with a meal.   clopidogrel (PLAVIX) 75 MG tablet Take 1 tablet (75 mg total) by mouth daily.   enalapril (VASOTEC) 20 MG tablet Take 1 tablet (20 mg total) by mouth 2 (two) times daily.   glimepiride (AMARYL) 4 MG tablet Take 4 mg by mouth daily with breakfast.   hydrochlorothiazide (HYDRODIURIL) 12.5 MG tablet Take 12.5 mg by mouth daily.   metFORMIN (GLUCOPHAGE) 1000 MG tablet Take 1 tablet (1,000 mg total) by mouth 2 (two) times daily with a meal.   nitroGLYCERIN (NITROSTAT) 0.4 MG SL tablet Place 1 tablet (0.4 mg total) under the tongue every 5 (five) minutes as needed. For chest pain   rosuvastatin (CRESTOR) 40 MG tablet Take 1 tablet (40 mg total) by mouth daily.   tamsulosin (FLOMAX) 0.4 MG CAPS capsule Take 0.4 mg by mouth daily. Patient report   traMADol (ULTRAM) 50 MG tablet Take 1 tablet (50 mg total) by mouth every 6 (six) hours as needed.     Review of Systems      All other systems reviewed and are otherwise negative except as noted above.  Physical Exam    VS:  BP  110/70   Pulse 92   Ht 5\' 6"  (1.676 m)   Wt 182 lb 3.2 oz (82.6 kg)   SpO2 95%   BMI 29.41 kg/m  , BMI Body mass index is 29.41 kg/m.  Wt Readings from Last 3 Encounters:  05/16/22 182 lb 3.2 oz (82.6 kg)  05/08/22 182 lb 15.7 oz (83 kg)  05/06/22 181 lb 9.6 oz (82.4 kg)     GEN: Well nourished, well developed, in no acute distress. HEENT: normal. Neck: Supple, no JVD, carotid bruits, or masses. Cardiac: RRR, no murmurs, rubs, or gallops. No clubbing, cyanosis, edema.  Radials/PT 2+ and equal bilaterally.  Respiratory:  Respirations regular and unlabored, clear to auscultation bilaterally. GI: Soft,  nontender, nondistended. MS: No deformity or atrophy. Skin: Warm and dry, no rash. Neuro:  Strength and sensation are intact. Psych: Normal affect.  Assessment & Plan    CAD s/p CABG and recent PCI to circumflex -Continue current medications including amlodipine 10 mg daily, aspirin 81 mg daily, carvedilol 25 mg twice a day, Plavix 75 mg daily, enalapril 20 mg twice a day, HCTZ 12.5 mg daily, nitro as needed, Crestor 40 mg daily -feels okay today without chest pain and SOB  Hypertension -110/70  -Continue current medications -Continue to monitor blood pressure at home -Discussed low-sodium, heart healthy, Mediterranean diet  Hyperlipidemia -Continue Crestor 40 mg daily -Recent lab work showed LDL 47, triglycerides 73  Diabetes -Hemoglobin A1c monitored by PCP -Discussed trying to keep his blood glucose less than 150 to promote healing  Tobacco abuse -no smoking now       Disposition: Follow up 3-4 months with Lance Muss, MD or APP.  Signed, Sharlene Dory, PA-C 05/16/2022, 4:35 PM [facility]

## 2022-05-21 ENCOUNTER — Ambulatory Visit: Payer: BLUE CROSS/BLUE SHIELD | Admitting: Physician Assistant

## 2022-07-07 ENCOUNTER — Other Ambulatory Visit: Payer: Self-pay | Admitting: Interventional Cardiology

## 2022-07-08 ENCOUNTER — Other Ambulatory Visit: Payer: Self-pay

## 2022-07-08 MED ORDER — ROSUVASTATIN CALCIUM 40 MG PO TABS: 40.0000 mg | ORAL_TABLET | Freq: Every day | ORAL | 3 refills | Status: AC
